# Patient Record
Sex: Male | Born: 1995 | Race: White | Hispanic: No | Marital: Single | Smoking: Former smoker
Health system: Southern US, Community
[De-identification: ages and names within clinical notes are randomized; demographics above are authoritative.]

## PROBLEM LIST (undated history)

## (undated) DIAGNOSIS — F419 Anxiety disorder, unspecified: Secondary | ICD-10-CM

## (undated) DIAGNOSIS — F32A Depression, unspecified: Secondary | ICD-10-CM

## (undated) DIAGNOSIS — R5383 Other fatigue: Secondary | ICD-10-CM

## (undated) DIAGNOSIS — M79669 Pain in unspecified lower leg: Secondary | ICD-10-CM

## (undated) DIAGNOSIS — K219 Gastro-esophageal reflux disease without esophagitis: Secondary | ICD-10-CM

## (undated) DIAGNOSIS — I10 Essential (primary) hypertension: Secondary | ICD-10-CM

## (undated) DIAGNOSIS — R7303 Prediabetes: Secondary | ICD-10-CM

## (undated) DIAGNOSIS — F840 Autistic disorder: Secondary | ICD-10-CM

## (undated) DIAGNOSIS — E7800 Pure hypercholesterolemia, unspecified: Secondary | ICD-10-CM

## (undated) DIAGNOSIS — R531 Weakness: Secondary | ICD-10-CM

## (undated) DIAGNOSIS — R0602 Shortness of breath: Secondary | ICD-10-CM

## (undated) DIAGNOSIS — F909 Attention-deficit hyperactivity disorder, unspecified type: Secondary | ICD-10-CM

## (undated) DIAGNOSIS — T7840XA Allergy, unspecified, initial encounter: Secondary | ICD-10-CM

## (undated) DIAGNOSIS — Q999 Chromosomal abnormality, unspecified: Secondary | ICD-10-CM

## (undated) DIAGNOSIS — G479 Sleep disorder, unspecified: Secondary | ICD-10-CM

## (undated) HISTORY — PX: OTHER SURGICAL HISTORY: SHX169

## (undated) HISTORY — DX: Shortness of breath: R06.02

## (undated) HISTORY — DX: Autistic disorder: F84.0

## (undated) HISTORY — DX: Pain in unspecified lower leg: M79.669

## (undated) HISTORY — DX: Pure hypercholesterolemia, unspecified: E78.00

## (undated) HISTORY — DX: Gastro-esophageal reflux disease without esophagitis: K21.9

## (undated) HISTORY — PX: ORCHIOPEXY: SHX479

## (undated) HISTORY — DX: Prediabetes: R73.03

## (undated) HISTORY — DX: Sleep disorder, unspecified: G47.9

## (undated) HISTORY — PX: CIRCUMCISION REVISION: SHX1347

## (undated) HISTORY — DX: Weakness: R53.1

## (undated) HISTORY — DX: Allergy, unspecified, initial encounter: T78.40XA

## (undated) HISTORY — DX: Anxiety disorder, unspecified: F41.9

## (undated) HISTORY — PX: CIRCUMCISION: SUR203

## (undated) HISTORY — DX: Depression, unspecified: F32.A

## (undated) HISTORY — DX: Other fatigue: R53.83

## (undated) HISTORY — PX: FRENULECTOMY, LINGUAL: SHX1681

## (undated) HISTORY — DX: Chromosomal abnormality, unspecified: Q99.9

---
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
## 2014-02-08 ENCOUNTER — Encounter: Payer: Self-pay | Admitting: Pediatrics

## 2014-02-08 ENCOUNTER — Ambulatory Visit (INDEPENDENT_AMBULATORY_CARE_PROVIDER_SITE_OTHER): Payer: Medicaid Other | Admitting: Pediatrics

## 2014-02-08 VITALS — BP 126/70 | Wt 327.1 lb

## 2014-02-08 DIAGNOSIS — R32 Unspecified urinary incontinence: Secondary | ICD-10-CM

## 2014-02-08 DIAGNOSIS — Z23 Encounter for immunization: Secondary | ICD-10-CM

## 2014-02-08 DIAGNOSIS — J301 Allergic rhinitis due to pollen: Secondary | ICD-10-CM

## 2014-02-08 MED ORDER — OXYBUTYNIN CHLORIDE ER 10 MG PO TB24: ORAL_TABLET | ORAL | Status: DC

## 2014-02-08 MED ORDER — CETIRIZINE HCL 10 MG PO TABS: ORAL_TABLET | ORAL | Status: DC

## 2014-02-08 NOTE — Progress Notes (Signed)
   Subjective:    Patient ID: Allen Harding, male    DOB: 21-Jan-1996, 18 y.o.   MRN: [id number]  HPI 18 year old male in to get refills on Oxybutynin and cetirizine. Both medications work but he's out. He's having some allergy symptoms now a little bit of snoring.   Review of Systems see history of present illness     Objective:   Physical Exam  Alert in no distress Nose: Congested turbinates Throat: Clear Lungs: Clear to auscultation Heart regular rate and rhythm without murmur      Assessment & Plan:  Allergic rhinitis Enuresis Plan we will oxybutynin and cetirizine HPV vaccine #1

## 2014-02-08 NOTE — Patient Instructions (Signed)

## 2014-03-23 ENCOUNTER — Other Ambulatory Visit: Payer: Self-pay | Admitting: *Deleted

## 2014-03-23 DIAGNOSIS — E669 Obesity, unspecified: Secondary | ICD-10-CM

## 2014-04-05 ENCOUNTER — Other Ambulatory Visit: Payer: Self-pay | Admitting: *Deleted

## 2014-04-05 DIAGNOSIS — R1013 Epigastric pain: Secondary | ICD-10-CM

## 2014-04-05 MED ORDER — OMEPRAZOLE 40 MG PO CPDR: 40.0000 mg | DELAYED_RELEASE_CAPSULE | Freq: Two times a day (BID) | ORAL | Status: DC

## 2014-04-21 ENCOUNTER — Ambulatory Visit: Payer: Medicaid Other

## 2014-04-22 ENCOUNTER — Encounter: Payer: Self-pay | Admitting: Pediatrics

## 2014-05-03 LAB — COMPREHENSIVE METABOLIC PANEL
ALBUMIN: 3.8 g/dL (ref 3.5–5.2)
ALT: 21 U/L (ref 0–53)
AST: 17 U/L (ref 0–37)
Alkaline Phosphatase: 105 U/L (ref 39–117)
BILIRUBIN TOTAL: 0.2 mg/dL (ref 0.2–1.1)
BUN: 16 mg/dL (ref 6–23)
CO2: 27 meq/L (ref 19–32)
Calcium: 9 mg/dL (ref 8.4–10.5)
Chloride: 104 mEq/L (ref 96–112)
Creat: 0.74 mg/dL (ref 0.50–1.35)
Glucose, Bld: 124 mg/dL — ABNORMAL HIGH (ref 70–99)
POTASSIUM: 4.6 meq/L (ref 3.5–5.3)
SODIUM: 140 meq/L (ref 135–145)
Total Protein: 6.7 g/dL (ref 6.0–8.3)

## 2014-05-03 LAB — TESTOSTERONE, FREE, TOTAL, SHBG
Sex Hormone Binding: 18 nmol/L (ref 13–71)
Testosterone, Free: 36.8 pg/mL — ABNORMAL LOW (ref 47.0–244.0)
Testosterone-% Free: 2.5 % (ref 1.6–2.9)
Testosterone: 145 ng/dL — ABNORMAL LOW (ref 300–890)

## 2014-05-03 LAB — HEMOGLOBIN A1C
Hgb A1c MFr Bld: 5.9 % — ABNORMAL HIGH (ref <5.7)
Mean Plasma Glucose: 123 mg/dL — ABNORMAL HIGH (ref <117)

## 2014-05-03 LAB — LIPID PANEL
Cholesterol: 202 mg/dL — ABNORMAL HIGH (ref 0–200)
HDL: 54 mg/dL (ref >39)
LDL Cholesterol: 110 mg/dL — ABNORMAL HIGH (ref 0–99)
Total CHOL/HDL Ratio: 3.7 Ratio
Triglycerides: 192 mg/dL — ABNORMAL HIGH (ref <150)
VLDL: 38 mg/dL (ref 0–40)

## 2014-05-03 LAB — LUTEINIZING HORMONE: LH: 12.8 m[IU]/mL

## 2014-05-03 LAB — T4, FREE: FREE T4: 0.91 ng/dL (ref 0.80–1.80)

## 2014-05-03 LAB — TSH: TSH: 3.35 u[IU]/mL (ref 0.350–4.500)

## 2014-05-03 LAB — FOLLICLE STIMULATING HORMONE: FSH: 6.7 m[IU]/mL (ref 1.4–18.1)

## 2014-05-03 LAB — ESTRADIOL: ESTRADIOL: 26.5 pg/mL

## 2014-05-09 ENCOUNTER — Encounter: Payer: Self-pay | Admitting: Pediatric Endocrinology

## 2014-05-09 ENCOUNTER — Ambulatory Visit (INDEPENDENT_AMBULATORY_CARE_PROVIDER_SITE_OTHER): Payer: Medicaid Other | Admitting: Pediatric Endocrinology

## 2014-05-09 VITALS — BP 171/98 | HR 138 | Wt 313.0 lb

## 2014-05-09 DIAGNOSIS — R7309 Other abnormal glucose: Secondary | ICD-10-CM

## 2014-05-09 DIAGNOSIS — R7303 Prediabetes: Secondary | ICD-10-CM

## 2014-05-09 DIAGNOSIS — N62 Hypertrophy of breast: Secondary | ICD-10-CM

## 2014-05-09 DIAGNOSIS — E291 Testicular hypofunction: Secondary | ICD-10-CM

## 2014-05-09 DIAGNOSIS — Q999 Chromosomal abnormality, unspecified: Secondary | ICD-10-CM

## 2014-05-09 DIAGNOSIS — Z68.41 Body mass index (BMI) pediatric, greater than or equal to 95th percentile for age: Secondary | ICD-10-CM

## 2014-05-09 MED ORDER — TESTOSTERONE CYPIONATE 200 MG/ML IM SOLN: 100.0000 mg | INTRAMUSCULAR | Status: DC

## 2014-05-09 NOTE — Patient Instructions (Addendum)
Continue Metformin  Start Testosterone 100 mg q month x 3 months  When you have your vial from the pharmacy call to schedule nurse visit for injection.   Follow up 1 month with Rayfield Citizenaroline and 4 months with me.  Labs prior to visit with me in 4 months - please complete post card at discharge.   Use notebook to keep track of food and drink choices and exercise achievements  Go to ortho to evaluate your arm!

## 2014-05-09 NOTE — Progress Notes (Signed)
Subjective:  Subjective Patient Name: Allen Harding Date of Birth: Oct 07, 1995  MRN: [id number]  Allen Harding Bar  presents to the office today for follow-up and management of his prediabetes, morbid obesity, hypertension, depression, dyspepsia, genetic chromosomal abnormality syndrome, precocity, gynecomastia, microphallus, hypogonadism, ADHD, tachycardia, goiter, behavior disorder, and hypercholesterolemia   HISTORY OF PRESENT ILLNESS:   Allen Harding is a 18 y.o. Caucasian male   Allen Harding was accompanied by his mother  1.  "Allen Harding" was first referred to our clinic on 09/10/06 by his his cardiologist, Dr. [facility], for evaluation and management of prediabetes and obesity. He was [age]-1/18 years old. His medical history is significant for abnormal chromosomes and developmental delay with walking and speech development at about age 41 year. At age 18 he still had some gross motor motor and fine motor problems. The family had been concerned about his weight since the first grade.     2. The patient's last [facility] visit was on 12/27/13. In the interim, he has been generally healthy. He had some girlfriend issues this fall and was eating everything he could find. He has continued to be active with [employer]. He is not currently working with the [employer] but is still trying to get enough hours to graduate. He was seen by his PCP in September and his weight was about 15 pounds heavier there. He is continuing on his blood pressure medication and continues to follow with Dr. Ace GinsBuck in cardiology (last visit in July). He is also taking his metformin daily. He is having to wear a belt because his pants are too loose. 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  He hurt his arm yesterday falling into a tree. He has not yet had it evaluated but has pain when he tries to extend his arm.   3. Pertinent Review of Systems:  Constitutional: The patient feels "like crap". The patient  seems to be in pain from his arm.  Eyes: Vision seems to be good. There are no recognized eye problems. Wears glasses- eye exam in April 2015 Neck: The patient has no complaints of anterior neck swelling, soreness, tenderness, pressure, discomfort, or difficulty swallowing.   Heart: Heart rate increases with exercise or other physical activity. The patient has no complaints of palpitations, irregular heart beats, chest pain, or chest pressure.   Gastrointestinal: Bowel movents seem normal. The patient has no complaints of excessive hunger, acid reflux, upset stomach, stomach aches or pains, diarrhea, or constipation.  Legs: Muscle mass and strength seem normal. There are no complaints of numbness, tingling, burning, or pain. No edema is noted.  Feet: There are no obvious foot problems. There are no complaints of numbness, tingling, burning, or pain. No edema is noted. Neurologic: There are no recognized problems with muscle movement and strength, sensation, or coordination. GYN/GU: no nocturia if he takes his oxybutin- but otherwise still with primary enuresis. Had renal ultrasound and structurally normal  PAST MEDICAL, FAMILY, AND SOCIAL HISTORY  Past Medical History  Diagnosis Date  . Anxiety   . Diabetes mellitus   . 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 Obesity Mother   . Diabetes Mother   . Hypertension Mother   . Hyperlipidemia Mother   . Obesity Sister   . Obesity Maternal Grandmother   . Diabetes Maternal Grandmother   . Obesity Maternal Grandfather  Current outpatient prescriptions: cetirizine (ZYRTEC) 10 MG tablet, TAKE ONE TABLET BY MOUTH DAILY., Disp: 30 tablet, Rfl: 5;  enalapril (VASOTEC) 5 MG tablet, Take 7.5 mg by mouth 2 (two) times daily., Disp: , Rfl: ;  guanFACINE (INTUNIV) 2 MG TB24, Take 3 mg by mouth daily. , Disp: , Rfl: ;  hydrOXYzine (ATARAX/VISTARIL) 25 MG tablet, Take 1 tablet (25 mg  total) by mouth every 6 (six) hours as needed for itching., Disp: 30 tablet, Rfl: 0 lisdexamfetamine (VYVANSE) 20 MG capsule, Take 20 mg by mouth every morning.  , Disp: , Rfl: ;  metFORMIN (GLUCOPHAGE) 500 MG tablet, Take 2 tablets (1,000 mg total) by mouth 2 (two) times daily with a meal., Disp: 60 tablet, Rfl: 6;  nystatin cream (MYCOSTATIN), Apply topically 2 (two) times daily. To groin/ belt area, Disp: 30 g, Rfl: 1 omeprazole (PRILOSEC) 40 MG capsule, Take 1 capsule (40 mg total) by mouth 2 (two) times daily., Disp: 60 capsule, Rfl: 6;  ondansetron (ZOFRAN-ODT) 4 MG disintegrating tablet, Take 1 tablet (4 mg total) by mouth every 8 (eight) hours as needed for nausea or vomiting., Disp: 20 tablet, Rfl: 0;  oxybutynin (DITROPAN-XL) 10 MG 24 hr tablet, TAKE 1 TABLET BY MOUTH DAILY., Disp: 30 tablet, Rfl: 3 sertraline (ZOLOFT) 25 MG tablet, Take 100 mg by mouth daily. , Disp: , Rfl: ;  testosterone cypionate (DEPOTESTOTERONE CYPIONATE) 200 MG/ML injection, Inject 0.5 mLs (100 mg total) into the muscle every 28 (twenty-eight) days. X 3 doses, Disp: 10 mL, Rfl: 0;  traZODone (DESYREL) 100 MG tablet, Take 100 mg by mouth at bedtime., Disp: , Rfl: ;  [DISCONTINUED] oxybutynin (DITROPAN) 5 MG tablet, Take 10 mg by mouth Nightly. , Disp: , Rfl:   Allergies as of 05/09/2014  . (No Known Allergies)     reports that he has never smoked. He has never used smokeless tobacco. He reports that he does not drink alcohol or use illicit drugs. Pediatric History  Patient Guardian Status  . Mother:  Allen Harding,Allen Harding   Other Topics Concern  . Not on file   Social History Narrative   Lives with mom, sister, 2 nieces, grandparents and sister's fiance.    12th grade at [employer]   Primary Care Provider: Martyn EhrichKHALIFA,DALIA, MD  ROS: There are no other significant problems involving Mecca's other body systems.    Objective:  Objective Vital Signs:  BP 171/98 mmHg  Pulse 138  Wt 313 lb (141.976 kg)   Ht  Readings from Last 3 Encounters:  09/07/13 5\' 9"  (1.753 m) (44 %*, Z = -0.16)  08/19/13 5' 8.5" (1.74 m) (37 %*, Z = -0.33)  02/22/13 5' 8.9" (1.75 m) (44 %*, Z = -0.15)   * Growth percentiles are based on CDC 2-20 Years data.   Wt Readings from Last 3 Encounters:  05/09/14 313 lb (141.976 kg) (100 %*, Z = 3.18)  02/08/14 327 lb 2 oz (148.383 kg) (100 %*, Z = 3.30)  12/27/13 312 lb (141.522 kg) (100 %*, Z = 3.16)   * Growth percentiles are based on CDC 2-20 Years data.   HC Readings from Last 3 Encounters:  [facility]   There is no height on file to calculate BSA. No height on file for this encounter. 100%ile (Z=3.18) based on CDC 2-20 Years weight-for-age data using vitals from 05/09/2014.    PHYSICAL EXAM:  Constitutional: The patient appears healthy and well nourished. The patient's height and weight are advanced for age.  Head: The head is normocephalic. Face: The face appears normal. There are no obvious dysmorphic features. Eyes: The eyes appear to be normally formed and spaced. Gaze is conjugate. There is no obvious arcus or proptosis. Moisture appears normal. Ears: The ears are normally placed and appear externally normal. Mouth: The oropharynx and tongue appear normal. Dentition appears to be normal for age. Oral moisture is normal. Neck: The neck appears to be visibly normal. The thyroid gland is 18+ grams in size. The consistency of the thyroid gland is normal. The thyroid gland is not tender to palpation. Lungs: The lungs are clear to auscultation. Air movement is good. Heart: Heart rate and rhythm are regular. Heart sounds S1 and S2 are normal. I did not appreciate any pathologic cardiac murmurs. Abdomen: The abdomen appears to be morbidly in size for the patient's age. Bowel sounds are normal. Unable to assess hepatomegaly, splenomegaly, or other mass effect.  Arms: Muscle size and bulk are normal for age. Point tenderness at base of radius on left arm.  Unable to extend left arm.  Hands: There is no obvious tremor. Phalangeal and metacarpophalangeal joints are normal. Palmar muscles are normal for age. Palmar skin is normal. Palmar moisture is also normal. Legs: Muscles appear normal for age. No edema is present. Feet: Feet are normally formed. Dorsalis pedal pulses are normal. Neurologic: Strength is normal for age in both the upper and lower extremities. Muscle tone is normal. Sensation to touch is normal in both the legs and feet.   GYN/GU: +2 gynecomastia  LAB DATA:   Results for orders placed or performed in visit on 03/23/14 (from the past 672 hour(s))  Hemoglobin A1c   Collection Time: 05/02/14  4:30 PM  Result Value Ref Range   Hgb A1c MFr Bld 5.9 (H) <5.7 %   Mean Plasma Glucose 123 (H) <117 mg/dL  Comprehensive metabolic panel   Collection Time: 05/02/14  4:30 PM  Result Value Ref Range   Sodium 140 135 - 145 mEq/L   Potassium 4.6 3.5 - 5.3 mEq/L   Chloride 104 96 - 112 mEq/L   CO2 27 19 - 32 mEq/L   Glucose, Bld 124 (H) 70 - 99 mg/dL   BUN 16 6 - 23 mg/dL   Creat 1.61 0.96 - 0.45 mg/dL   Total Bilirubin 0.2 0.2 - 1.1 mg/dL   Alkaline Phosphatase 105 39 - 117 U/L   AST 17 0 - 37 U/L   ALT 21 0 - 53 U/L   Total Protein 6.7 6.0 - 8.3 g/dL   Albumin 3.8 3.5 - 5.2 g/dL   Calcium 9.0 8.4 - 40.9 mg/dL  Lipid panel   Collection Time: 05/02/14  4:30 PM  Result Value Ref Range   Cholesterol 202 (H) 0 - 200 mg/dL   Triglycerides 811 (H) <150 mg/dL   HDL 54 >91 mg/dL   Total CHOL/HDL Ratio 3.7 Ratio   VLDL 38 0 - 40 mg/dL   LDL Cholesterol 478 (H) 0 - 99 mg/dL  TSH   Collection Time: 05/02/14  4:30 PM  Result Value Ref Range   TSH 3.350 0.350 - 4.500 uIU/mL  T4, free   Collection Time: 05/02/14  4:30 PM  Result Value Ref Range   Free T4 0.91 0.80 - 1.80 ng/dL  Estradiol   Collection Time: 05/02/14  4:30 PM  Result Value Ref Range   Estradiol 26.5 pg/mL  Follicle stimulating hormone   Collection Time: 05/02/14   4:30 PM  Result Value  Ref Range   FSH 6.7 1.4 - 18.1 mIU/mL  Luteinizing hormone   Collection Time: 05/02/14  4:30 PM  Result Value Ref Range   LH 12.8 mIU/mL  Testosterone, free, total   Collection Time: 05/02/14  4:30 PM  Result Value Ref Range   Testosterone 145 (L) 300 - 890 ng/dL   Sex Hormone Binding 18 13 - 71 nmol/L   Testosterone, Free 36.8 (L) 47.0 - 244.0 pg/mL   Testosterone-% Free 2.5 1.6 - 2.9 %      Assessment and Plan:  Assessment ASSESSMENT:  1. Morbid obesity- weight today is stabe last visit- but was 15 pounds heavier at PCP in September 2. Hypertension- persistent- worse today- likely due to acute arm injury 3. Prediabetes- a1c has increased again 4. Gynecomastia- will likely need surgery to correct but needs to manage weight first (currently not a good surgical candidate).  5. Hypogonadism- has had chronically low serum testosterone levels. Relatively normal to low gonadotropins.  6. Elevated triglycerides- much higher than last check- but labs not done fasting.  PLAN:  1. Diagnostic: labs as above. Fasting labs prior to visit in 4 months for lipids, cmp, tfts, puberty labs 2. Therapeutic: No changes 3. Patient education: Reviewed growth data. Discussed challenges and goals. Discussed trial of testosterone and family would like to try 3 month "jump start" for puberty. Discussed having him work with Rayfield Citizen for more frequent accountability and encouragement. Lu Duffel a little nervous but agrees to at least 1 visit with Rayfield Citizen to "meet her".  Mom and Allen Reno both participated in visit today.  4. Follow-up: Return in about 1 month (around 06/09/2014). 1 month with Rayfield Citizen and 4 months with me.      Cammie Sickle, MD

## 2014-05-17 ENCOUNTER — Encounter: Payer: Self-pay | Admitting: Pediatric Endocrinology

## 2014-05-17 ENCOUNTER — Ambulatory Visit (INDEPENDENT_AMBULATORY_CARE_PROVIDER_SITE_OTHER): Payer: Medicaid Other | Admitting: Pediatric Endocrinology

## 2014-05-17 VITALS — BP 160/92 | HR 96 | Temp 96.8°F | Wt 310.0 lb

## 2014-05-17 DIAGNOSIS — E291 Testicular hypofunction: Secondary | ICD-10-CM

## 2014-05-19 NOTE — Progress Notes (Signed)
Patient was given 0.5 ml of Testosterone Cypinate for a dose of 100 mg. Injection given in left arm without problems.  Perrigo Lot [id number] Exp. 6/17

## 2014-06-09 ENCOUNTER — Encounter: Payer: Self-pay | Admitting: Pediatrics

## 2014-06-09 ENCOUNTER — Ambulatory Visit (INDEPENDENT_AMBULATORY_CARE_PROVIDER_SITE_OTHER): Payer: Medicaid Other | Admitting: Pediatrics

## 2014-06-09 VITALS — BP 139/87 | HR 95 | Wt 319.0 lb

## 2014-06-09 DIAGNOSIS — R7309 Other abnormal glucose: Secondary | ICD-10-CM

## 2014-06-09 DIAGNOSIS — N62 Hypertrophy of breast: Secondary | ICD-10-CM

## 2014-06-09 DIAGNOSIS — R7303 Prediabetes: Secondary | ICD-10-CM

## 2014-06-09 DIAGNOSIS — Q999 Chromosomal abnormality, unspecified: Secondary | ICD-10-CM

## 2014-06-09 DIAGNOSIS — I10 Essential (primary) hypertension: Secondary | ICD-10-CM

## 2014-06-09 NOTE — Patient Instructions (Signed)
Goals:  1. Walking around the curve every day mom feels good  2. Mom will look into getting back into the [employer] this month 3. Only 1 sweet drink a day!!

## 2014-06-09 NOTE — Progress Notes (Signed)
Subjective:  Subjective Patient Name: Allen Harding Date of Birth: [birth year]  MRN: [id number]  Allen Harding  presents to the office today for follow-up and management of his prediabetes, morbid obesity, hypertension, depression, dyspepsia, genetic chromosomal abnormality syndrome, precocity, gynecomastia, microphallus, hypogonadism, ADHD, tachycardia, goiter, behavior disorder, and hypercholesterolemia   HISTORY OF PRESENT ILLNESS:   Allen Harding is a 19 y.o. Caucasian male   Allen Harding was accompanied by his mother  1.  "Allen Harding" was first referred to our clinic on 09/10/06 by his his cardiologist, Dr. [name] of [facility], for evaluation and management of prediabetes and obesity. He was 66-1/20 years old. His medical history is significant for abnormal chromosomes and developmental delay with walking and speech development at about age 51 year. At age 59 he still had some gross motor motor and fine motor problems. The family had been concerned about his weight since the first grade.     2. The patient's last [facility] visit was on 05/17/14.   Mom has been keeping a food log since the beginning of January. He has not been eating as much and doesn't eat lunch at school but has been drinking a significant amount of soda (3-4 cans a day). He only likes green beans and peas. He ate significant amounts of food over the holidays because he was home and bored.   He forgets to take his medications on some days. He is doing well on not taking other people's food in the house. He feels like he needs ranch on everything. Having a hard time being away from soda. They have had to lock the refrigerator in the past to keep him from eating. If they take away things like his cellphone to enforce consequences on him not doing what he should around the house, he becomes physically violent. His current goals are to meet a girlfriend online. He has little supervision around his online activities despite his autism and will ask the women  to be his girlfriend when he has just contacted them.      Mom is taking Invokana for her diabetes. It has worked really well for her. His next cardiology visit is 2/18.     3. Pertinent Review of Systems:  Constitutional: The patient feels "tired". The patient seems to be in pain from his arm.  Eyes: Vision seems to be good. There are no recognized eye problems. Wears glasses- eye exam in April 2015 Neck: The patient has no complaints of anterior neck swelling, soreness, tenderness, pressure, discomfort, or difficulty swallowing.   Heart: Heart rate increases with exercise or other physical activity. The patient has no complaints of palpitations, irregular heart beats, chest pain, or chest pressure.   Gastrointestinal: Bowel movents seem normal. The patient has no complaints of excessive hunger, acid reflux, upset stomach, stomach aches or pains, diarrhea, or constipation.  Legs: Muscle mass and strength seem normal. There are no complaints of numbness, tingling, burning, or pain. No edema is noted.  Feet: There are no obvious foot problems. There are no complaints of numbness, tingling, burning, or pain. No edema is noted. Neurologic: There are no recognized problems with muscle movement and strength, sensation, or coordination. GYN/GU: no nocturia if he takes his oxybutin- but otherwise still with primary enuresis. Had renal ultrasound and structurally normal  PAST MEDICAL, FAMILY, AND SOCIAL HISTORY  Past Medical History  Diagnosis Date  . Anxiety   . Diabetes mellitus   . 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 Obesity Mother   . Diabetes Mother   . Hypertension Mother   . Hyperlipidemia Mother   . Obesity Sister   . Obesity Maternal Grandmother   . Diabetes Maternal Grandmother   . Obesity Maternal Grandfather     Current outpatient prescriptions: cetirizine (ZYRTEC) 10 MG tablet, TAKE ONE TABLET BY MOUTH  DAILY., Disp: 30 tablet, Rfl: 5;  enalapril (VASOTEC) 5 MG tablet, Take 7.5 mg by mouth 2 (two) times daily., Disp: , Rfl: ;  guanFACINE (INTUNIV) 2 MG TB24, Take 3 mg by mouth daily. , Disp: , Rfl: ;  hydrOXYzine (ATARAX/VISTARIL) 25 MG tablet, Take 1 tablet (25 mg total) by mouth every 6 (six) hours as needed for itching., Disp: 30 tablet, Rfl: 0 lisdexamfetamine (VYVANSE) 20 MG capsule, Take 20 mg by mouth every morning.  , Disp: , Rfl: ;  metFORMIN (GLUCOPHAGE) 500 MG tablet, Take 2 tablets (1,000 mg total) by mouth 2 (two) times daily with a meal., Disp: 60 tablet, Rfl: 6;  omeprazole (PRILOSEC) 40 MG capsule, Take 1 capsule (40 mg total) by mouth 2 (two) times daily., Disp: 60 capsule, Rfl: 6 oxybutynin (DITROPAN-XL) 10 MG 24 hr tablet, TAKE 1 TABLET BY MOUTH DAILY., Disp: 30 tablet, Rfl: 3;  sertraline (ZOLOFT) 25 MG tablet, Take 100 mg by mouth daily. , Disp: , Rfl: ;  testosterone cypionate (DEPOTESTOTERONE CYPIONATE) 200 MG/ML injection, Inject 0.5 mLs (100 mg total) into the muscle every 28 (twenty-eight) days. X 3 doses, Disp: 10 mL, Rfl: 0;  traZODone (DESYREL) 100 MG tablet, Take 100 mg by mouth at bedtime., Disp: , Rfl:  nystatin cream (MYCOSTATIN), Apply topically 2 (two) times daily. To groin/ belt area (Patient not taking: Reported on 06/09/2014), Disp: 30 g, Rfl: 1;  ondansetron (ZOFRAN-ODT) 4 MG disintegrating tablet, Take 1 tablet (4 mg total) by mouth every 8 (eight) hours as needed for nausea or vomiting. (Patient not taking: Reported on 06/09/2014), Disp: 20 tablet, Rfl: 0 [DISCONTINUED] oxybutynin (DITROPAN) 5 MG tablet, Take 10 mg by mouth Nightly. , Disp: , Rfl:   Allergies as of 06/09/2014  . (No Known Allergies)     reports that he has never smoked. He has never used smokeless tobacco. He reports that he does not drink alcohol or use illicit drugs. Pediatric History  Patient Guardian Status  . Mother:  Alpha, Mysliwiec   Other Topics Concern  . Not on file   Social History  Narrative   Lives with mom, sister, 2 nieces, grandparents and sister's fiance.    12th grade at [employer]   Primary Care Provider: [name], MD  ROS: There are no other significant problems involving Allen Harding's other body systems.    Objective:  Objective Vital Signs:  BP 139/87 mmHg  Pulse 95  Wt 319 lb (144.697 kg)   Ht Readings from Last 3 Encounters:  09/07/13  (1.753 m) (44 %*, Z = -0.16)  08/19/13 5' 8.5" (1.74 m) (37 %*, Z = -0.33)  02/22/13 5' 8.9" (1.75 m) (44 %*, Z = -0.15)   * Growth percentiles are based on CDC 2-20 Years data.   Wt Readings from Last 3 Encounters:  06/09/14 319 lb (144.697 kg) (100 %*, Z = 3.24)  05/17/14 310 lb (140.615 kg) (100 %*, Z = 3.15)  05/09/14 313 lb (141.976 kg) (100 %*, Z = 3.18)   * Growth percentiles are based on CDC 2-20 Years data.   HC Readings from  Last 3 Encounters:  No data found for HC   There is no height on file to calculate BSA. No height on file for this encounter. 100%ile (Z=3.24) based on CDC 2-20 Years weight-for-age data using vitals from 06/09/2014.    PHYSICAL EXAM:  Constitutional: The patient appears healthy and well nourished. The patient's height and weight are advanced for age.  Head: The head is normocephalic. Face: The face appears normal. There are no obvious dysmorphic features. Eyes: The eyes appear to be normally formed and spaced. Gaze is conjugate. There is no obvious arcus or proptosis. Moisture appears normal. Ears: The ears are normally placed and appear externally normal. Mouth: The oropharynx and tongue appear normal. Dentition appears to be normal for age. Oral moisture is normal. Neck: The neck appears to be visibly normal. The thyroid gland is 18+ grams in size. The consistency of the thyroid gland is normal. The thyroid gland is not tender to palpation. Lungs: The lungs are clear to auscultation. Air movement is good. Heart: Heart rate and rhythm are regular. Heart  sounds S1 and S2 are normal. I did not appreciate any pathologic cardiac murmurs. Abdomen: The abdomen appears to be morbidly in size for the patient's age. Bowel sounds are normal. Unable to assess hepatomegaly, splenomegaly, or other mass effect.  Arms: Muscle size and bulk are normal for age. Hands: There is no obvious tremor. Phalangeal and metacarpophalangeal joints are normal. Palmar muscles are normal for age. Palmar skin is normal. Palmar moisture is also normal. Legs: Muscles appear normal for age. No edema is present. Feet: Feet are normally formed. Dorsalis pedal pulses are normal. Neurologic: Strength is normal for age in both the upper and lower extremities. Muscle tone is normal. Sensation to touch is normal in both the legs and feet.   GYN/GU: +2 gynecomastia  LAB DATA:   No results found for this or any previous visit (from the past 672 hour(s)).    Assessment and Plan:  Assessment ASSESSMENT:  1. Morbid obesity-He has gained 9 pounds in the month since we saw him last  2. Hypertension- persistent- better today than last visit. They will f/u with the cardiologist next month.  3. Prediabetes- not always taking medications. Not yet due for repeat A1C 4. Gynecomastia- will likely need surgery to correct but needs to manage weight first (currently not a good surgical candidate).  5. Hypogonadism- has had chronically low serum testosterone levels. Started testosterone after last visit for a 3 month trial.  6. Elevated triglycerides- much higher than last check- but labs not done fasting.  PLAN:  1. Diagnostic: Fasting labs prior to visit in 4 months for lipids, cmp, tfts, puberty labs 2. Therapeutic: Continue testosterone injections 100 mg every other week to attempt to induce puberty and improve muscle mass and energy.  3. Patient education: Reviewed growth data. Discussed challenges and goals. Allen RenoStewart agreed to 1 sugary beverage a day and walking around the curve at the house  when mom feels well. Mom will also work on getting back into the [employer].  Mom and Allen RenoStewart both participated in visit today.  4. Follow-up: No Follow-up on file. 1 month with Rayfield Citizenaroline and 4 months with me.      Hacker,Caroline T, FNP-C

## 2014-06-20 ENCOUNTER — Ambulatory Visit: Payer: Medicaid Other

## 2014-06-23 ENCOUNTER — Encounter: Payer: Self-pay | Admitting: *Deleted

## 2014-06-23 NOTE — Progress Notes (Signed)
On 06/20/2014 patient was given an injection in his left arm of 100mg  of Testosterone Cypionate.  Lot [id number], exp 6/17.  His temp was 97 degrees F oral,  Pulse 112  Unable to obtain BP.  Injection given without difficulty per Dr. Vanessa DurhamBadik. KW

## 2014-07-11 ENCOUNTER — Telehealth: Payer: Self-pay

## 2014-07-11 ENCOUNTER — Other Ambulatory Visit: Payer: Self-pay | Admitting: Pediatrics

## 2014-07-11 DIAGNOSIS — R32 Unspecified urinary incontinence: Secondary | ICD-10-CM

## 2014-07-11 NOTE — Telephone Encounter (Signed)
Oxybutynin ER 10mg  Needs reill.  Patient only has 1 refill left on medication.

## 2014-07-12 MED ORDER — OXYBUTYNIN CHLORIDE ER 10 MG PO TB24: ORAL_TABLET | ORAL | Status: DC

## 2014-07-12 NOTE — Telephone Encounter (Addendum)
Request for refill on Ditropan for child with chromosomal abnormality with associated host of medical issues followed by multiple specialists - endocrine, psychiatry.  Has been taking ditropan chronically for lifelong hx of enuresis. Will refill current prescription as requested by parent.

## 2014-07-12 NOTE — Addendum Note (Signed)
Addended by: Faylene KurtzLEINER, Yalena Colon on: 07/12/2014 05:22 PM   Modules accepted: Orders

## 2014-07-13 ENCOUNTER — Ambulatory Visit: Payer: Medicaid Other | Admitting: Pediatrics

## 2014-07-28 ENCOUNTER — Ambulatory Visit: Payer: Medicaid Other

## 2014-07-28 ENCOUNTER — Encounter: Payer: Self-pay | Admitting: Pediatrics

## 2014-07-28 ENCOUNTER — Ambulatory Visit (INDEPENDENT_AMBULATORY_CARE_PROVIDER_SITE_OTHER): Payer: Medicaid Other | Admitting: Pediatrics

## 2014-07-28 VITALS — BP 180/100 | HR 101 | Wt 322.0 lb

## 2014-07-28 DIAGNOSIS — R7303 Prediabetes: Secondary | ICD-10-CM

## 2014-07-28 DIAGNOSIS — N62 Hypertrophy of breast: Secondary | ICD-10-CM

## 2014-07-28 DIAGNOSIS — E291 Testicular hypofunction: Secondary | ICD-10-CM

## 2014-07-28 DIAGNOSIS — R7309 Other abnormal glucose: Secondary | ICD-10-CM

## 2014-07-28 DIAGNOSIS — I10 Essential (primary) hypertension: Secondary | ICD-10-CM

## 2014-07-28 LAB — GLUCOSE, POCT (MANUAL RESULT ENTRY): POC Glucose: 79 mg/dl (ref 70–99)

## 2014-07-28 LAB — POCT GLYCOSYLATED HEMOGLOBIN (HGB A1C): Hemoglobin A1C: 5.5

## 2014-07-28 MED ORDER — CHLORTHALIDONE 25 MG PO TABS: 25.0000 mg | ORAL_TABLET | Freq: Every day | ORAL | Status: DC

## 2014-07-28 NOTE — Progress Notes (Signed)
Subjective:  Subjective Patient Name: Allen Harding Date of Birth: January 11, 1996  MRN: [id number]  Allen Harding  presents to the office today for follow-up and management of his prediabetes, morbid obesity, hypertension, depression, dyspepsia, genetic chromosomal abnormality syndrome, precocity, gynecomastia, microphallus, hypogonadism, ADHD, tachycardia, goiter, behavior disorder, and hypercholesterolemia   HISTORY OF PRESENT ILLNESS:   Allen Harding is a 19 y.o. Caucasian male   Allen Harding was accompanied by his mother  1.  "Allen Harding" was first referred to our clinic on 09/10/06 by his his cardiologist, Dr. [name] of [facility], for evaluation and management of prediabetes and obesity. He was 58-1/20 years old. His medical history is significant for abnormal chromosomes and developmental delay with walking and speech development at about age 89 year. At age 66 he still had some gross motor motor and fine motor problems. The family had been concerned about his weight since the first grade.     2. The patient's last [facility] visit was on 06/09/14.   They haven't been getting mis medicine in as consistently twice a day. Did his last testosterone shot today. He was nervous about it because it hurts.   He has cut back on the soda. He is drinking more milk or flavored water. He will go all day and not eat and then come home after school and eat more. A few times in the last week he has had breakfast to eat (cereal or poptart). He has been volunteering at the [employer].   He feels like the testosterone has "kind of " made a difference. He sort of has more energy. She feels like he isn't eating as much. He is still struggling with depression and is not doing any exercise. He has a new girlfriend who is in [place]. Mom wants him to concentrate on graduation. [facility] is managing his medications for depression. He hasn't seen them in a while.   Scale of 1-10 importance of making a change for his health:  8 Likelihood of doing this: 9    3. Pertinent Review of Systems:  Constitutional: The patient feels "good". He is not having headaches from his hypertension.   Eyes: Vision seems to be good. There are no recognized eye problems. Wears glasses- eye exam in April 2015. Hasn't gotten new glasses yet.  Neck: The patient has no complaints of anterior neck swelling, soreness, tenderness, pressure, discomfort, or difficulty swallowing.   Heart: Heart rate increases with exercise or other physical activity. The patient has no complaints of palpitations, irregular heart beats, chest pain, or chest pressure.   Gastrointestinal: Bowel movents seem normal. The patient has no complaints of excessive hunger, acid reflux, upset stomach, stomach aches or pains, diarrhea, or constipation.  Legs: Muscle mass and strength seem normal. There are no complaints of numbness, tingling, burning, or pain. No edema is noted.  Feet: There are no obvious foot problems. There are no complaints of numbness, tingling, burning, or pain. No edema is noted. Neurologic: There are no recognized problems with muscle movement and strength, sensation, or coordination. GYN/GU: no nocturia if he takes his oxybutin- but otherwise still with primary enuresis. Had renal ultrasound and structurally normal  PAST MEDICAL, FAMILY, AND SOCIAL HISTORY  Past Medical History  Diagnosis Date  . Anxiety   . Diabetes mellitus   . 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 Obesity Mother   . Diabetes Mother   .  Hypertension Mother   . Hyperlipidemia Mother   . Obesity Sister   . Obesity Maternal Grandmother   . Diabetes Maternal Grandmother   . Obesity Maternal Grandfather      Current outpatient prescriptions:  .  cetirizine (ZYRTEC) 10 MG tablet, TAKE ONE TABLET BY MOUTH DAILY., Disp: 30 tablet, Rfl: 5 .  enalapril (VASOTEC) 5 MG tablet, Take 10 mg by mouth 2 (two)  times daily. , Disp: , Rfl:  .  guanFACINE (INTUNIV) 2 MG TB24, Take 3 mg by mouth daily. , Disp: , Rfl:  .  hydrOXYzine (ATARAX/VISTARIL) 25 MG tablet, Take 1 tablet (25 mg total) by mouth every 6 (six) hours as needed for itching., Disp: 30 tablet, Rfl: 0 .  lisdexamfetamine (VYVANSE) 20 MG capsule, Take 20 mg by mouth every morning.  , Disp: , Rfl:  .  metFORMIN (GLUCOPHAGE) 500 MG tablet, Take 2 tablets (1,000 mg total) by mouth 2 (two) times daily with a meal., Disp: 60 tablet, Rfl: 6 .  nystatin cream (MYCOSTATIN), Apply topically 2 (two) times daily. To groin/ belt area, Disp: 30 g, Rfl: 1 .  omeprazole (PRILOSEC) 40 MG capsule, Take 1 capsule (40 mg total) by mouth 2 (two) times daily., Disp: 60 capsule, Rfl: 6 .  ondansetron (ZOFRAN-ODT) 4 MG disintegrating tablet, Take 1 tablet (4 mg total) by mouth every 8 (eight) hours as needed for nausea or vomiting., Disp: 20 tablet, Rfl: 0 .  oxybutynin (DITROPAN-XL) 10 MG 24 hr tablet, TAKE 1 TABLET BY MOUTH DAILY., Disp: 30 tablet, Rfl: 3 .  sertraline (ZOLOFT) 25 MG tablet, Take 100 mg by mouth daily. , Disp: , Rfl:  .  testosterone cypionate (DEPOTESTOTERONE CYPIONATE) 200 MG/ML injection, Inject 0.5 mLs (100 mg total) into the muscle every 28 (twenty-eight) days. X 3 doses, Disp: 10 mL, Rfl: 0 .  traZODone (DESYREL) 100 MG tablet, Take 100 mg by mouth at bedtime., Disp: , Rfl:  .  chlorthalidone (HYGROTON) 25 MG tablet, Take 1 tablet (25 mg total) by mouth daily., Disp: 30 tablet, Rfl: 3 .  [DISCONTINUED] oxybutynin (DITROPAN) 5 MG tablet, Take 10 mg by mouth Nightly. , Disp: , Rfl:   Allergies as of 07/28/2014  . (No Known Allergies)     reports that he has never smoked. He has never used smokeless tobacco. He reports that he does not drink alcohol or use illicit drugs. Pediatric History  Patient Guardian Status  . Mother:  [name],[name]   Other Topics Concern  . Not on file   Social History Narrative   Lives with mom, sister, 2  nieces, grandparents and sister's fiance.    12th grade at [employer]   Primary Care Provider: Martyn EhrichKHALIFA,DALIA, MD  ROS: There are no other significant problems involving Zechariah's other body systems.    Objective:  Objective Vital Signs:  BP 171/109 mmHg  Pulse 101  Wt 322 lb (146.058 kg)   Ht Readings from Last 3 Encounters:  09/07/13 5\' 9"  (1.753 m) (44 %*, Z = -0.16)  08/19/13 5' 8.5" (1.74 m) (37 %*, Z = -0.33)  02/22/13 5' 8.9" (1.75 m) (44 %*, Z = -0.15)   * Growth percentiles are based on CDC 2-20 Years data.   Wt Readings from Last 3 Encounters:  07/28/14 322 lb (146.058 kg) (100 %*, Z = 3.28)  06/09/14 319 lb (144.697 kg) (100 %*, Z = 3.24)  05/17/14 310 lb (140.615 kg) (100 %*, Z = 3.15)   * Growth percentiles are  based on CDC 2-20 Years data.   HC Readings from Last 3 Encounters:  No data found for [facility]   There is no height on file to calculate BSA. No height on file for this encounter. 100%ile (Z=3.28) based on CDC 2-20 Years weight-for-age data using vitals from 07/28/2014.    PHYSICAL EXAM:  Constitutional: The patient appears healthy and well nourished. The patient's height and weight are advanced for age.  Head: The head is normocephalic. Face: The face appears normal. There are no obvious dysmorphic features. Eyes: The eyes appear to be normally formed and spaced. Gaze is conjugate. There is no obvious arcus or proptosis. Moisture appears normal. Ears: The ears are normally placed and appear externally normal. Mouth: The oropharynx and tongue appear normal. Dentition appears to be normal for age. Oral moisture is normal. Neck: The neck appears to be visibly normal. The thyroid gland is 18+ grams in size. The consistency of the thyroid gland is normal. The thyroid gland is not tender to palpation. Lungs: The lungs are clear to auscultation. Air movement is good. Heart: Heart rate and rhythm are regular. Heart sounds S1 and S2 are normal. I did not  appreciate any pathologic cardiac murmurs. Abdomen: The abdomen appears to be morbidly in size for the patient's age. Bowel sounds are normal. Unable to assess hepatomegaly, splenomegaly, or other mass effect.  Arms: Muscle size and bulk are normal for age. Hands: There is no obvious tremor. Phalangeal and metacarpophalangeal joints are normal. Palmar muscles are normal for age. Palmar skin is normal. Palmar moisture is also normal. Legs: Muscles appear normal for age. No edema is present. Feet: Feet are normally formed. Dorsalis pedal pulses are normal. Neurologic: Strength is normal for age in both the upper and lower extremities. Muscle tone is normal. Sensation to touch is normal in both the legs and feet.   GYN/GU: +2 gynecomastia  LAB DATA:   Results for orders placed or performed in visit on 07/28/14 (from the past 672 hour(s))  POCT Glucose (CBG)   Collection Time: 07/28/14  3:13 PM  Result Value Ref Range   POC Glucose 79 70 - 99 mg/dl  POCT HgB Z6X   Collection Time: 07/28/14  3:32 PM  Result Value Ref Range   Hemoglobin A1C 5.5       Assessment and Plan:  Assessment ASSESSMENT:  1. Morbid obesity-continues to gain weight-- up 3 pounds again.  2. Hypertension-despite cardiology follow up and an increase of his enalapril, his BP is the worse I've seen it, even on a manual check after his anxiety about testosterone injection had subsided. Consulted with his cardiology group in office to make changes to BP 3. Prediabetes- more consistent with metformin. A1C improved today.  4. Gynecomastia- consistent with continued morbid obesity. 5. Hypogonadism- Has now completed 3 month trial of testosterone. Feels like it has made minimal difference  6. Elevated triglycerides- much higher than last check- but labs not done fasting.   PLAN:  1. Diagnostic: Fasting labs prior to next visit for lipids, cmp, tfts, puberty labs 2. Therapeutic: change enalapril to 10 mg daily, add  chlorthalidone 25 mg daily for BP.  3. Patient education: Reviewed growth data. Discussed challenges and goals. Allen Harding and mom will try and get back into the [employer]. He seems very apathetic about making changes despite significant hypertension today in clinic.  4. Follow-up: 2 weeks for BP check, 1 month for f/u visit with [facility] T,  FNP-C  Level of Service: This visit lasted in excess of 25 minutes. More than 50% of the visit was devoted to counseling.

## 2014-07-28 NOTE — Patient Instructions (Addendum)
New blood pressure medication: chlorthalidone 25 mg in the morning Change enalapril to 10 mg once daily in the morning   Goals:  1. At least 2 vegetables a day.  2. Go at least 3 days a week to the [employer] and spend 15 minutes on the elliptical. You can do weights after that.   Labs prior to next visit- please complete post card at discharge.

## 2014-07-28 NOTE — Progress Notes (Signed)
Injection given in right deltoid, testosterone cypionate 100 mg. Lot [id number]; exp 9/17. KW

## 2014-08-25 ENCOUNTER — Ambulatory Visit (INDEPENDENT_AMBULATORY_CARE_PROVIDER_SITE_OTHER): Payer: Medicaid Other | Admitting: Pediatrics

## 2014-08-25 ENCOUNTER — Encounter: Payer: Self-pay | Admitting: Pediatrics

## 2014-08-25 VITALS — BP 162/104 | HR 123 | Wt 318.6 lb

## 2014-08-25 DIAGNOSIS — F341 Dysthymic disorder: Secondary | ICD-10-CM

## 2014-08-25 DIAGNOSIS — Z5181 Encounter for therapeutic drug level monitoring: Secondary | ICD-10-CM

## 2014-08-25 DIAGNOSIS — R7303 Prediabetes: Secondary | ICD-10-CM

## 2014-08-25 DIAGNOSIS — I10 Essential (primary) hypertension: Secondary | ICD-10-CM

## 2014-08-25 DIAGNOSIS — R7309 Other abnormal glucose: Secondary | ICD-10-CM | POA: Diagnosis not present

## 2014-08-25 DIAGNOSIS — E291 Testicular hypofunction: Secondary | ICD-10-CM | POA: Diagnosis not present

## 2014-08-25 LAB — COMPREHENSIVE METABOLIC PANEL
ALT: 26 U/L (ref 0–53)
AST: 20 U/L (ref 0–37)
Albumin: 4.2 g/dL (ref 3.5–5.2)
Alkaline Phosphatase: 112 U/L (ref 39–117)
BUN: 15 mg/dL (ref 6–23)
CALCIUM: 9.7 mg/dL (ref 8.4–10.5)
CHLORIDE: 95 meq/L — AB (ref 96–112)
CO2: 26 meq/L (ref 19–32)
Creat: 0.76 mg/dL (ref 0.50–1.35)
GLUCOSE: 84 mg/dL (ref 70–99)
Potassium: 4.2 mEq/L (ref 3.5–5.3)
SODIUM: 135 meq/L (ref 135–145)
Total Bilirubin: 0.4 mg/dL (ref 0.2–1.1)
Total Protein: 7.5 g/dL (ref 6.0–8.3)

## 2014-08-25 LAB — LIPID PANEL
Cholesterol: 199 mg/dL (ref 0–200)
HDL: 46 mg/dL (ref 31–65)
LDL Cholesterol: 134 mg/dL — ABNORMAL HIGH (ref 0–99)
Total CHOL/HDL Ratio: 4.3 Ratio
Triglycerides: 95 mg/dL (ref <150)
VLDL: 19 mg/dL (ref 0–40)

## 2014-08-25 MED ORDER — ENALAPRIL MALEATE 20 MG PO TABS: 20.0000 mg | ORAL_TABLET | Freq: Every day | ORAL | Status: DC

## 2014-08-25 NOTE — Patient Instructions (Signed)
Keep taking the chlorthalidone every day. We will increase the enalapril to 20 mg daily. You can take 2 of the 10 mg pills until you run out or you can just start with the new prescription.

## 2014-08-25 NOTE — Progress Notes (Signed)
Subjective:  Subjective Patient Name: Allen Harding Mallozzi Date of Birth: [birth year]  MRN: [id number]  Allen Harding Madej  presents to the office today for follow-up and management of his prediabetes, morbid obesity, hypertension, depression, dyspepsia, genetic chromosomal abnormality syndrome, precocity, gynecomastia, microphallus, hypogonadism, ADHD, tachycardia, goiter, behavior disorder, and hypercholesterolemia   HISTORY OF PRESENT ILLNESS:   Allen Harding is a 19 y.o. Caucasian male   Allen Harding was accompanied by his mother  1.  "[name]" was first referred to our clinic on 09/10/06 by his his cardiologist, Dr. Rosil[facility], for evaluation and management of prediabetes and obesity. He was 6011-1/20 years old. His medical history is significant for abnormal chromosomes and developmental delay with walking and speech development at about age 74 year. At age 19 he still had some gross motor motor and fine motor problems. The family had been concerned about his weight since the first grade.     2. The patient's last [facility] visit was on 07/28/14.   Pt reports he has been taking his medication every day in the morning. He is often missing his night medications. He has been coughing a lot. He has not been taking his Zyrtec. He hasn't noticed any changes with the testosterone. 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 He is not eating any better during the day. Mom is having a very hard time getting him up in the morning. He is still drinking crystal light and some diet soda. Denies headaches or blurry vision. He is having a lot of physical complaints when he doesn't want to do things. He sleeps well when he takes his trazodone.   [facility]. They will see her next month.     3. Pertinent Review of Systems:  Constitutional: The patient feels "good". He is not having headaches from his hypertension.   Eyes: Vision seems to be good. There are no  recognized eye problems. Wears glasses- eye exam in April 2015. Hasn't gotten new glasses yet.  Neck: The patient has no complaints of anterior neck swelling, soreness, tenderness, pressure, discomfort, or difficulty swallowing.   Heart: Heart rate increases with exercise or other physical activity. The patient has no complaints of palpitations, irregular heart beats, chest pain, or chest pressure.   Gastrointestinal: Bowel movents seem normal. The patient has no complaints of excessive hunger, acid reflux, upset stomach, stomach aches or pains, diarrhea, or constipation.  Legs: Muscle mass and strength seem normal. There are no complaints of numbness, tingling, burning, or pain. No edema is noted.  Feet: There are no obvious foot problems. There are no complaints of numbness, tingling, burning, or pain. No edema is noted. Neurologic: There are no recognized problems with muscle movement and strength, sensation, or coordination. GYN/GU: no nocturia if he takes his oxybutin- but otherwise still with primary enuresis. Had renal ultrasound and structurally normal  PAST MEDICAL, FAMILY, AND SOCIAL HISTORY  Past Medical History  Diagnosis Date  . Anxiety   . Diabetes mellitus   . 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 Obesity Mother   . Diabetes Mother   . Hypertension Mother   . Hyperlipidemia Mother   . Obesity Sister   . Obesity Maternal Grandmother   . Diabetes Maternal Grandmother   . Obesity Maternal Grandfather      Current outpatient prescriptions:  .  cetirizine (ZYRTEC) 10 MG tablet, TAKE ONE TABLET BY MOUTH DAILY., Disp: 30 tablet, Rfl: 5 .  chlorthalidone (HYGROTON) 25 MG tablet, Take 1 tablet (25 mg total) by mouth daily., Disp: 30 tablet, Rfl: 3 .  enalapril (VASOTEC) 5 MG tablet, Take 10 mg by mouth 2 (two) times daily. , Disp: , Rfl:  .  guanFACINE (INTUNIV) 2 MG TB24, Take 3 mg by mouth daily. ,  Disp: , Rfl:  .  lisdexamfetamine (VYVANSE) 20 MG capsule, Take 20 mg by mouth every morning.  , Disp: , Rfl:  .  metFORMIN (GLUCOPHAGE) 500 MG tablet, Take 2 tablets (1,000 mg total) by mouth 2 (two) times daily with a meal., Disp: 60 tablet, Rfl: 6 .  omeprazole (PRILOSEC) 40 MG capsule, Take 1 capsule (40 mg total) by mouth 2 (two) times daily., Disp: 60 capsule, Rfl: 6 .  oxybutynin (DITROPAN-XL) 10 MG 24 hr tablet, TAKE 1 TABLET BY MOUTH DAILY., Disp: 30 tablet, Rfl: 3 .  sertraline (ZOLOFT) 25 MG tablet, Take 100 mg by mouth daily. , Disp: , Rfl:  .  traZODone (DESYREL) 100 MG tablet, Take 100 mg by mouth at bedtime., Disp: , Rfl:  .  hydrOXYzine (ATARAX/VISTARIL) 25 MG tablet, Take 1 tablet (25 mg total) by mouth every 6 (six) hours as needed for itching. (Patient not taking: Reported on 08/25/2014), Disp: 30 tablet, Rfl: 0 .  nystatin cream (MYCOSTATIN), Apply topically 2 (two) times daily. To groin/ belt area (Patient not taking: Reported on 08/25/2014), Disp: 30 g, Rfl: 1 .  ondansetron (ZOFRAN-ODT) 4 MG disintegrating tablet, Take 1 tablet (4 mg total) by mouth every 8 (eight) hours as needed for nausea or vomiting. (Patient not taking: Reported on 08/25/2014), Disp: 20 tablet, Rfl: 0 .  testosterone cypionate (DEPOTESTOTERONE CYPIONATE) 200 MG/ML injection, Inject 0.5 mLs (100 mg total) into the muscle every 28 (twenty-eight) days. X 3 doses (Patient not taking: Reported on 08/25/2014), Disp: 10 mL, Rfl: 0 .  [DISCONTINUED] oxybutynin (DITROPAN) 5 MG tablet, Take 10 mg by mouth Nightly. , Disp: , Rfl:   Allergies as of 08/25/2014  . (No Known Allergies)     reports that he has never smoked. He has never used smokeless tobacco. He reports that he does not drink alcohol or use illicit drugs. Pediatric History  Patient Guardian Status  . Mother:  Devontre, Siedschlag   Other Topics Concern  . Not on file   Social History Narrative   Lives with mom, sister, 2 nieces, grandparents and sister's  fiance.    12th grade at [employer]   Primary Care Provider: [name], MD  ROS: There are no other significant problems involving Josiyah's other body systems.    Objective:  Objective Vital Signs:  BP 162/104 mmHg  Pulse 123  Wt 318 lb 9.6 oz (144.516 kg)   Ht Readings from Last 3 Encounters:  09/07/13 5\' 9"  (1.753 m) (44 %*, Z = -0.16)  08/19/13 5' 8.5" (1.74 m) (37 %*, Z = -0.33)  02/22/13 5' 8.9" (1.75 m) (44 %*, Z = -0.15)   * Growth percentiles are based on CDC 2-20 Years data.   Wt Readings from Last 3 Encounters:  08/25/14 318 lb 9.6 oz (144.516 kg) (100 %*, Z = 3.25)  07/28/14 322 lb (146.058 kg) (100 %*, Z = 3.28)  06/09/14 319 lb (144.697 kg) (100 %*, Z = 3.24)   * Growth percentiles are based on CDC 2-20 Years data.   HC Readings from Last 3  Encounters:  No data found for HC   There is no height on file to calculate BSA. No height on file for this encounter. 100%ile (Z=3.25) based on CDC 2-20 Years weight-for-age data using vitals from 08/25/2014.    PHYSICAL EXAM:  Constitutional: The patient appears healthy and well nourished. The patient's height and weight are advanced for age.  Head: The head is normocephalic. Face: The face appears normal. There are no obvious dysmorphic features. Eyes: The eyes appear to be normally formed and spaced. Gaze is conjugate. There is no obvious arcus or proptosis. Moisture appears normal. Ears: The ears are normally placed and appear externally normal. Mouth: The oropharynx and tongue appear normal. Dentition appears to be normal for age. Oral moisture is normal. Neck: The neck appears to be visibly normal. The thyroid gland is 18+ grams in size. The consistency of the thyroid gland is normal. The thyroid gland is not tender to palpation. Lungs: The lungs are clear to auscultation. Air movement is good. Heart: Heart rate and rhythm are regular. Heart sounds S1 and S2 are normal. I did not appreciate any  pathologic cardiac murmurs. Abdomen: The abdomen appears to be morbidly in size for the patient's age. Bowel sounds are normal. Unable to assess hepatomegaly, splenomegaly, or other mass effect.  Arms: Muscle size and bulk are normal for age. Hands: There is no obvious tremor. Phalangeal and metacarpophalangeal joints are normal. Palmar muscles are normal for age. Palmar skin is normal. Palmar moisture is also normal. Legs: Muscles appear normal for age. No edema is present. Feet: Feet are normally formed. Dorsalis pedal pulses are normal. Neurologic: Strength is normal for age in both the upper and lower extremities. Muscle tone is normal. Sensation to touch is normal in both the legs and feet.   GYN/GU: +2 gynecomastia  LAB DATA:   Results for orders placed or performed in visit on 07/28/14 (from the past 672 hour(s))  POCT Glucose (CBG)   Collection Time: 07/28/14  3:13 PM  Result Value Ref Range   POC Glucose 79 70 - 99 mg/dl  POCT HgB Z6X   Collection Time: 07/28/14  3:32 PM  Result Value Ref Range   Hemoglobin A1C 5.5    Results for orders placed or performed in visit on 08/25/14  Comprehensive metabolic panel  Result Value Ref Range   Sodium 135 135 - 145 mEq/L   Potassium 4.2 3.5 - 5.3 mEq/L   Chloride 95 (L) 96 - 112 mEq/L   CO2 26 19 - 32 mEq/L   Glucose, Bld 84 70 - 99 mg/dL   BUN 15 6 - 23 mg/dL   Creat 0.96 0.45 - 4.09 mg/dL   Total Bilirubin 0.4 0.2 - 1.1 mg/dL   Alkaline Phosphatase 112 39 - 117 U/L   AST 20 0 - 37 U/L   ALT 26 0 - 53 U/L   Total Protein 7.5 6.0 - 8.3 g/dL   Albumin 4.2 3.5 - 5.2 g/dL   Calcium 9.7 8.4 - 81.1 mg/dL  Lipid panel  Result Value Ref Range   Cholesterol 199 0 - 200 mg/dL   Triglycerides 95 <914 mg/dL   HDL 46 31 - 65 mg/dL   Total CHOL/HDL Ratio 4.3 Ratio   VLDL 19 0 - 40 mg/dL   LDL Cholesterol 782 (H) 0 - 99 mg/dL  TSH  Result Value Ref Range   TSH 1.638 0.350 - 4.500 uIU/mL  T4, free  Result Value Ref Range   Free T4  1.14 0.80 - 1.80 ng/dL  Luteinizing hormone  Result Value Ref Range   LH 6.6 mIU/mL  Follicle stimulating hormone  Result Value Ref Range   FSH 5.6 1.4 - 18.1 mIU/mL  Estradiol  Result Value Ref Range   Estradiol 34.2 pg/mL  Testosterone, free, total  Result Value Ref Range   Testosterone 128 (L) 300 - 890 ng/dL   Sex Hormone Binding 18 10 - 50 nmol/L   Testosterone, Free 32.3 (L) 47.0 - 244.0 pg/mL   Testosterone-% Free 2.5 1.6 - 2.9 %  Vit D  25 hydroxy (rtn osteoporosis monitoring)  Result Value Ref Range   Vit D, 25-Hydroxy 19 (L) 30 - 100 ng/mL       Assessment and Plan:  Assessment    ASSESSMENT:  1. Morbid obesity-has lost 3 pounds again.   2. Hypertension-despite addition of chlorthalidone at last visit, BP is increased. Will continue to monitor this closely during testosterone therapy.  3. Prediabetes- more consistent with metformin. A1C improved today.  4. Gynecomastia- consistent with continued morbid obesity. 5. Hypogonadism- Has now completed 3 month trial of testosterone. Feels like it has made minimal difference  6. Elevated triglycerides- much higher than last check- but labs not done fasting.   PLAN:  1. Diagnostic: Labs today for lipids, cmp, tfts, puberty labs. Testosterone continues to be persistently low despite trial. Will need to add testosterone 100 mg q2 weeks. Mom will come to office for teaching on giving injection with Kassina. Will repeat labs in one month midway between testosterone dosing for accurate values.  Will obtain CBC at this visit as well to monitor hematocrit.  2. Therapeutic: change enalapril to 20 mg daily, continue chlorthalidone 25 mg daily for BP.  3. Patient education: Reviewed growth data. Discussed challenges and goals. [name] and mom will try and get back into the [facility]. He seems very apathetic about making changes despite significant hypertension today in clinic. Hopefully testosterone will help with dysthymia.   4. Follow-up:  1 month with Kyung Rudd T, FNP-C  Level of Service: This visit lasted in excess of 25 minutes. More than 50% of the visit was devoted to counseling.

## 2014-08-26 LAB — T4, FREE: Free T4: 1.14 ng/dL (ref 0.80–1.80)

## 2014-08-26 LAB — ESTRADIOL: Estradiol: 34.2 pg/mL

## 2014-08-26 LAB — TESTOSTERONE, FREE, TOTAL, SHBG
Sex Hormone Binding: 18 nmol/L (ref 10–50)
TESTOSTERONE-% FREE: 2.5 % (ref 1.6–2.9)
Testosterone, Free: 32.3 pg/mL — ABNORMAL LOW (ref 47.0–244.0)
Testosterone: 128 ng/dL — ABNORMAL LOW (ref 300–890)

## 2014-08-26 LAB — FOLLICLE STIMULATING HORMONE: FSH: 5.6 m[IU]/mL (ref 1.4–18.1)

## 2014-08-26 LAB — TSH: TSH: 1.638 u[IU]/mL (ref 0.350–4.500)

## 2014-08-26 LAB — LUTEINIZING HORMONE: LH: 6.6 m[IU]/mL

## 2014-08-26 LAB — VITAMIN D 25 HYDROXY (VIT D DEFICIENCY, FRACTURES): Vit D, 25-Hydroxy: 19 ng/mL — ABNORMAL LOW (ref 30–100)

## 2014-08-26 MED ORDER — LETROZOLE 2.5 MG PO TABS: 2.5000 mg | ORAL_TABLET | Freq: Every day | ORAL | Status: DC

## 2014-09-02 ENCOUNTER — Telehealth: Payer: Self-pay | Admitting: *Deleted

## 2014-09-02 NOTE — Telephone Encounter (Signed)
Spoke to mom, advised that per [place] Citizenaroline Stewart's testosterone continues to be quite low despite our 3 month trial to see if we could stimulate his body to make more. He would benefit from continued treatment for this problem. Two options are possible-- daily aromitase inhibitor therapy by mouth or testosterone injections every 2 weeks. It would be beneficial for the family if they could learn how to do these at home. We can try the aromitase inhibitor route if that is what they prefer, however, we are not as likely to have as good of a response as we would get with the testosterone therapy. His vitamin D is also low. She should give him a supplement that has at least 5,000 IU daily. Please discuss with mom if she feels like she could learn injections or we need to proceed with oral treatment. Mom advises she would come Wed. 4/6 and give his first shot with me present.

## 2014-09-06 DIAGNOSIS — Z5181 Encounter for therapeutic drug level monitoring: Secondary | ICD-10-CM | POA: Insufficient documentation

## 2014-09-06 MED ORDER — TESTOSTERONE CYPIONATE 200 MG/ML IM SOLN: 100.0000 mg | INTRAMUSCULAR | Status: DC

## 2014-09-07 ENCOUNTER — Encounter: Payer: Self-pay | Admitting: *Deleted

## 2014-09-13 ENCOUNTER — Ambulatory Visit (INDEPENDENT_AMBULATORY_CARE_PROVIDER_SITE_OTHER): Payer: Medicaid Other | Admitting: Pediatrics

## 2014-09-13 ENCOUNTER — Encounter: Payer: Self-pay | Admitting: Pediatrics

## 2014-09-13 VITALS — BP 128/78 | Ht 68.5 in | Wt 324.0 lb

## 2014-09-13 DIAGNOSIS — Z00121 Encounter for routine child health examination with abnormal findings: Secondary | ICD-10-CM | POA: Diagnosis not present

## 2014-09-13 DIAGNOSIS — Z68.41 Body mass index (BMI) pediatric, greater than or equal to 95th percentile for age: Secondary | ICD-10-CM | POA: Diagnosis not present

## 2014-09-13 NOTE — Progress Notes (Signed)
Routine Well-Adolescent Visit  PCP: Allen Adler, MD   History was provided by the patient and mother.  Allen Harding is a 19 y.o. male who is here for his annual well visit.  Current concerns: -Has a complex hx which includes DMII and obesity being followed by endocrinology, gynecomastia, hypogonadism currently being treated on testosterone, hypertension and hyperlipidemia being seen in Cardiology for, dysthymia for which he is seeing [facility] and bad allergic rhinitis. Is currently doing well with services. Mom looking to transition him to an internist soon who can help coordinate and treat him. Also on vitamin D and letrozole. -Going to be graduating soon and then will start looking for a job.  Adolescent Assessment:  Confidentiality was discussed with the patient and if applicable, with caregiver as well.  Home and Environment:  Lives with: Grandparents, mother, an uncle Parental relations: Good Friends/Peers: Good  Nutrition/Eating Behaviors: tries to eat a good variety including fruits and veggies Sports/Exercise:  Not much  Education and Employment:  School Status: in 12th grade in regular classroom and is doing well School History: School attendance is regular. 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 Yes  Smoking: No Secondhand smoke exposure? yes - Uncle smokes outside but not often Drugs/EtOH: None  Menstruation:   Menarche: not applicable in this male child.  Sexuality: Heterosexual Sexually active? no  sexual partners in last year:0, denies any sexual activity in present or past contraception use: abstinence Last STI Screening: None; recommend calling home number if results positive  Violence/Abuse: No Mood: Suicidality and Depression:  Has a hx of depression being seen in [facility], denies SI or homicidal ideation Weapons: No  Screenings: The patient completed the Rapid Assessment for Adolescent Preventive Services screening questionnaire and the following topics were identified as risk factors and discussed: Not done but we discussed healthy eating, exercise, abuse/trauma, weapon use, tobacco use, marijuana use, drug use, condom use, sexuality and mental health issues   ROS: Gen: Negative HEENT: +URI symptoms CV: +HTN Resp: Negative GI: Negative GU: No penile drainage/discharge; +hypogonadism Neuro: +autism, +dysthymia Skin: Negative  Physical Exam:  BP 128/78 mmHg  Ht 5' 8.5" (1.74 m)  Wt 324 lb (146.965 kg)  BMI 48.54 kg/m2 Blood pressure percentiles are 74% systolic and 56% diastolic based on 2000 NHANES data.   General Appearance:   alert, oriented, no acute distress, well nourished and obese  HENT: Normocephalic, no obvious abnormality, conjunctiva clear  Mouth:   Normal appearing teeth, no obvious discoloration, dental caries, or dental caps  Neck:   Supple  Lungs:   Clear to auscultation bilaterally, normal work of breathing  Heart:   Regular rate and rhythm, S1 and S2 normal, no murmurs;   Abdomen:   Soft, non-tender, no mass, or organomegaly  GU genitalia not examined  Musculoskeletal:   Tone and strength strong and symmetrical, all extremities               Lymphatic:   No cervical adenopathy  Skin/Hair/Nails:   Skin WWP  Neurologic:   Strength, gait, and coordination normal and age-appropriate    Assessment/Plan: -Discussed continuing current management as per specialists. To see Cardiology soon and while there will have hyperlipidemia addressed - Continue Vitamin D - Mom to work on having Allen Harding transitioned to adult  provider; will let us know if she needs anything to help -Urine gonorrhea and chlamydia sent  BMI: is not appropriate for age  Immunizations today: None  - Follow-up visit in 3  months for next visit, or sooner as needed.   Allen ShadowKavithashree Dhilan Brauer, MD

## 2014-09-14 ENCOUNTER — Ambulatory Visit: Payer: Medicaid Other | Admitting: Pediatric Endocrinology

## 2014-09-14 LAB — GC/CHLAMYDIA PROBE AMP, URINE
Chlamydia, Swab/Urine, PCR: NEGATIVE
GC Probe Amp, Urine: NEGATIVE

## 2014-09-28 ENCOUNTER — Ambulatory Visit (INDEPENDENT_AMBULATORY_CARE_PROVIDER_SITE_OTHER): Payer: Medicaid Other | Admitting: Pediatrics

## 2014-09-28 ENCOUNTER — Encounter: Payer: Self-pay | Admitting: Pediatrics

## 2014-09-28 VITALS — BP 143/91 | HR 116 | Wt 321.4 lb

## 2014-09-28 DIAGNOSIS — N62 Hypertrophy of breast: Secondary | ICD-10-CM

## 2014-09-28 DIAGNOSIS — R7303 Prediabetes: Secondary | ICD-10-CM

## 2014-09-28 DIAGNOSIS — R7309 Other abnormal glucose: Secondary | ICD-10-CM

## 2014-09-28 DIAGNOSIS — I10 Essential (primary) hypertension: Secondary | ICD-10-CM

## 2014-09-28 DIAGNOSIS — E291 Testicular hypofunction: Secondary | ICD-10-CM

## 2014-09-28 LAB — POCT GLYCOSYLATED HEMOGLOBIN (HGB A1C): HEMOGLOBIN A1C: 5.7

## 2014-09-28 LAB — GLUCOSE, POCT (MANUAL RESULT ENTRY): POC GLUCOSE: 110 mg/dL — AB (ref 70–99)

## 2014-09-28 NOTE — Patient Instructions (Addendum)
Cardiology will see you next month for your appointment.   We will see you back in 2 months. We will call you with labs. Continue working on taking your medications consistently.

## 2014-09-28 NOTE — Progress Notes (Signed)
Subjective:  Subjective Patient Name: Allen Harding Date of Birth: 1995-06-27  MRN: [id number]  Allen Harding  presents to the office today for follow-up and management of his prediabetes, morbid obesity, hypertension, depression, dyspepsia, genetic chromosomal abnormality syndrome, precocity, gynecomastia, microphallus, hypogonadism, ADHD, tachycardia, goiter, behavior disorder, and hypercholesterolemia   HISTORY OF PRESENT ILLNESS:   Allen Harding is a 19 y.o. Caucasian male   Jamario was accompanied by his mother  1.  "Allen Harding" was first referred to our clinic on 09/10/06 by his his cardiologist, Dr. [name] of [facility], for evaluation and management of prediabetes and obesity. He was 36-1/20 years old. His medical history is significant for abnormal chromosomes and developmental delay with walking and speech development at about age 89 year. At age 5 he still had some gross motor motor and fine motor problems. The family had been concerned about his weight since the first grade.     2. The patient's last [facility] visit was on 08/25/14.   Mom reports that things are ok. Still trying to get him graduated. He is starting to shut down and not do stuff. Mom is concerned about the hypogonadism diagnosis and what that will mean for his future childbearing possibilities. Testosterone shots are going ok. Second was last Wednesday-- did labs this AM. He has noticed a little more facial hair on his face. He went to prom last weekend. MD needed to reschedule youth haven. Mom having trouble getting him to take his night medication. He is taking his medications for his blood pressure. He follows up with cardiology May 26th. No new issues with anger or aggression noted at home.    3. Pertinent Review of Systems:  Constitutional: The patient feels "good". He is not having headaches from his hypertension.   Eyes: Vision seems to be good. There are no recognized eye problems. Wears glasses- eye exam in April 2015. Hasn't gotten  new glasses yet.  Neck: The patient has no complaints of anterior neck swelling, soreness, tenderness, pressure, discomfort, or difficulty swallowing.   Heart: Heart rate increases with exercise or other physical activity. The patient has no complaints of palpitations, irregular heart beats, chest pain, or chest pressure.   Gastrointestinal: Bowel movents seem normal. The patient has no complaints of excessive hunger, acid reflux, upset stomach, stomach aches or pains, diarrhea, or constipation.  Legs: Muscle mass and strength seem normal. There are no complaints of numbness, tingling, burning, or pain. No edema is noted.  Feet: There are no obvious foot problems. There are no complaints of numbness, tingling, burning, or pain. No edema is noted. Neurologic: There are no recognized problems with muscle movement and strength, sensation, or coordination. GYN/GU: no nocturia if he takes his oxybutin- but otherwise still with primary enuresis. Had renal ultrasound and structurally normal  PAST MEDICAL, FAMILY, AND SOCIAL HISTORY  Past Medical History  Diagnosis Date  . Anxiety   . Diabetes mellitus   . 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 Obesity Mother   . Diabetes Mother   . Hypertension Mother   . Hyperlipidemia Mother   . Obesity Sister   . Obesity Maternal Grandmother   . Diabetes Maternal Grandmother   . Obesity Maternal Grandfather      Current outpatient prescriptions:  .  cetirizine (ZYRTEC) 10 MG tablet, TAKE ONE TABLET BY MOUTH DAILY., Disp: 30 tablet, Rfl: 5 .  cholecalciferol (VITAMIN D) 400 UNITS TABS  tablet, Take 5,000 Units by mouth daily., Disp: , Rfl:  .  enalapril (VASOTEC) 20 MG tablet, Take 1 tablet (20 mg total) by mouth daily., Disp: 30 tablet, Rfl: 3 .  guanFACINE (INTUNIV) 2 MG TB24, Take 2 mg by mouth daily. , Disp: , Rfl:  .  hydrOXYzine (ATARAX/VISTARIL) 25 MG tablet, Take 1 tablet (25  mg total) by mouth every 6 (six) hours as needed for itching., Disp: 30 tablet, Rfl: 0 .  letrozole (FEMARA) 2.5 MG tablet, Take 2.5 mg by mouth daily., Disp: , Rfl:  .  lisdexamfetamine (VYVANSE) 20 MG capsule, Take 70 mg by mouth every morning. , Disp: , Rfl:  .  metFORMIN (GLUCOPHAGE) 500 MG tablet, Take 2 tablets (1,000 mg total) by mouth 2 (two) times daily with a meal., Disp: 60 tablet, Rfl: 6 .  omeprazole (PRILOSEC) 40 MG capsule, Take 1 capsule (40 mg total) by mouth 2 (two) times daily., Disp: 60 capsule, Rfl: 6 .  oxybutynin (DITROPAN) 5 MG tablet, Take 10 mg by mouth., Disp: , Rfl:  .  oxybutynin (DITROPAN-XL) 10 MG 24 hr tablet, TAKE 1 TABLET BY MOUTH DAILY., Disp: 30 tablet, Rfl: 3 .  sertraline (ZOLOFT) 25 MG tablet, Take 100 mg by mouth daily. , Disp: , Rfl:  .  testosterone cypionate (DEPOTESTOTERONE CYPIONATE) 200 MG/ML injection, Inject 0.5 mLs (100 mg total) into the muscle every 14 (fourteen) days., Disp: 10 mL, Rfl: 0 .  traZODone (DESYREL) 100 MG tablet, Take 100 mg by mouth at bedtime., Disp: , Rfl:  .  chlorthalidone (HYGROTON) 25 MG tablet, Take 1 tablet (25 mg total) by mouth daily. (Patient not taking: Reported on 09/28/2014), Disp: 30 tablet, Rfl: 3 .  nystatin cream (MYCOSTATIN), Apply topically 2 (two) times daily. To groin/ belt area (Patient not taking: Reported on 08/25/2014), Disp: 30 g, Rfl: 1 .  ondansetron (ZOFRAN-ODT) 4 MG disintegrating tablet, Take 1 tablet (4 mg total) by mouth every 8 (eight) hours as needed for nausea or vomiting. (Patient not taking: Reported on 08/25/2014), Disp: 20 tablet, Rfl: 0 .  [DISCONTINUED] oxybutynin (DITROPAN) 5 MG tablet, Take 10 mg by mouth Nightly. , Disp: , Rfl:   Allergies as of 09/28/2014  . (No Known Allergies)     reports that he has never smoked. He has never used smokeless tobacco. He reports that he does not drink alcohol or use illicit drugs. Pediatric History  Patient Guardian Status  . Mother:  [place], [place]    Other Topics Concern  . Not on file   Social History Narrative   Lives with mom, sister, 2 nieces, grandparents and sister's fiance.    12th grade at [employer]   Primary Care Provider: Shaaron Adler, MD  ROS: There are no other significant problems involving Timathy's other body systems.    Objective:  Objective Vital Signs:  BP 143/91 mmHg  Pulse 116  Wt 321 lb 6.4 oz (145.786 kg)   Ht Readings from Last 3 Encounters:  09/13/14 5' 8.5" (1.74 m) (35 %*, Z = -0.38)  09/07/13  (1.753 m) (44 %*, Z = -0.16)  08/19/13 5' 8.5" (1.74 m) (37 %*, Z = -0.33)   * Growth percentiles are based on CDC 2-20 Years data.   Wt Readings from Last 3 Encounters:  09/28/14 321 lb 6.4 oz (145.786 kg) (100 %*, Z = 3.29)  09/13/14 324 lb (146.965 kg) (100 %*, Z = 3.31)  08/25/14 318 lb 9.6 oz (144.516 kg) (100 %*, Z =  3.25)   * Growth percentiles are based on CDC 2-20 Years data.   HC Readings from Last 3 Encounters:  No data found for [facility]   There is no height on file to calculate BSA. No height on file for this encounter. 100%ile (Z=3.29) based on CDC 2-20 Years weight-for-age data using vitals from 09/28/2014.    PHYSICAL EXAM:  Constitutional: The patient appears healthy and well nourished. The patient's height and weight are advanced for age.  Head: The head is normocephalic. Face: The face appears normal. There are no obvious dysmorphic features. Eyes: The eyes appear to be normally formed and spaced. Gaze is conjugate. There is no obvious arcus or proptosis. Moisture appears normal. Ears: The ears are normally placed and appear externally normal. Mouth: The oropharynx and tongue appear normal. Dentition appears to be normal for age. Oral moisture is normal. Neck: The neck appears to be visibly normal. The thyroid gland is 18+ grams in size. The consistency of the thyroid gland is normal. The thyroid gland is not tender to palpation. Lungs: The lungs are  clear to auscultation. Air movement is good. Heart: Heart rate and rhythm are regular. Heart sounds S1 and S2 are normal. I did not appreciate any pathologic cardiac murmurs. Abdomen: The abdomen appears to be morbidly in size for the patient's age. Bowel sounds are normal. Unable to assess hepatomegaly, splenomegaly, or other mass effect.  Arms: Muscle size and bulk are normal for age. Hands: There is no obvious tremor. Phalangeal and metacarpophalangeal joints are normal. Palmar muscles are normal for age. Palmar skin is normal. Palmar moisture is also normal. Legs: Muscles appear normal for age. No edema is present. Feet: Feet are normally formed. Dorsalis pedal pulses are normal. Neurologic: Strength is normal for age in both the upper and lower extremities. Muscle tone is normal. Sensation to touch is normal in both the legs and feet.   GYN/GU: +2 gynecomastia  LAB DATA:   Results for orders placed or performed in visit on 09/28/14 (from the past 672 hour(s))  POCT Glucose (CBG)   Collection Time: 09/28/14  3:52 PM  Result Value Ref Range   POC Glucose 110 (A) 70 - 99 mg/dl  POCT HgB Z6X   Collection Time: 09/28/14  4:03 PM  Result Value Ref Range   Hemoglobin A1C 5.7   Results for orders placed or performed in visit on 09/13/14 (from the past 672 hour(s))  GC/chlamydia probe amp, urine   Collection Time: 09/13/14  4:57 PM  Result Value Ref Range   Chlamydia, Swab/Urine, PCR NEGATIVE NEGATIVE   GC Probe Amp, Urine NEGATIVE NEGATIVE   Results for orders placed or performed in visit on 09/28/14  POCT Glucose (CBG)  Result Value Ref Range   POC Glucose 110 (A) 70 - 99 mg/dl  POCT HgB W9U  Result Value Ref Range   Hemoglobin A1C 5.7        Assessment and Plan:  Assessment    ASSESSMENT:  1. Morbid obesity- weight continues to fluctuate. .   2. Hypertension- BP was normal at PCP visit. Is slightly elevated today but much better than previous visits.  3. Prediabetes-  continues to be prediabetic. Taking metformin usually only once a day.   4. Gynecomastia- consistent with continued morbid obesity. 5. Hypogonadism- Now on testosterone every other week 100 mg. Had labs drawn this AM. Will make dose adjustment as needed. CBC ok today-- slightly anemic.  6. Elevated triglycerides- much higher than last check- but  labs not done fasting.   PLAN:  1. Diagnostic: Repeat testosterone labs in 2 months  2. Therapeutic: Continue enalapril to 20 mg daily, continue chlorthalidone 25 mg daily for BP. Continue metformin and testosterone.  3. Patient education: Reviewed growth data. Discussed challenges and goals. Allen RenoStewart will work on getting graduated before next appointment. Will space to 2 months with cardiology in the middle. Will call mom with lab results. Discussed role of hypogonadism in childbearing and no need to do any kind of semen analysis at this time-- recommended reproductive endocrinology consult if/when he gets to this time in his life. Will continue to seek adult internal med who may be able to manage all his care through adulthood. 4. Follow-up: 2 months       Hedda Crumbley T, FNP  Level of Service: This visit lasted in excess of 25 minutes. More than 50% of the visit was devoted to counseling.

## 2014-09-29 ENCOUNTER — Other Ambulatory Visit: Payer: Self-pay | Admitting: Pediatrics

## 2014-09-29 DIAGNOSIS — E291 Testicular hypofunction: Secondary | ICD-10-CM

## 2014-09-29 DIAGNOSIS — D509 Iron deficiency anemia, unspecified: Secondary | ICD-10-CM | POA: Insufficient documentation

## 2014-09-29 LAB — TESTOSTERONE, FREE, TOTAL, SHBG
Sex Hormone Binding: 12 nmol/L (ref 10–50)
TESTOSTERONE FREE: 155.9 pg/mL (ref 47.0–244.0)
Testosterone-% Free: 3.2 % — ABNORMAL HIGH (ref 1.6–2.9)
Testosterone: 487 ng/dL (ref 300–890)

## 2014-09-29 LAB — CBC
HCT: 37.7 % — ABNORMAL LOW (ref 39.0–52.0)
HEMOGLOBIN: 11.9 g/dL — AB (ref 13.0–17.0)
MCH: 26.9 pg (ref 26.0–34.0)
MCHC: 31.6 g/dL (ref 30.0–36.0)
MCV: 85.3 fL (ref 78.0–100.0)
MPV: 9 fL (ref 8.6–12.4)
Platelets: 427 10*3/uL — ABNORMAL HIGH (ref 150–400)
RBC: 4.42 MIL/uL (ref 4.22–5.81)
RDW: 15.3 % (ref 11.5–15.5)
WBC: 11 10*3/uL — ABNORMAL HIGH (ref 4.0–10.5)

## 2014-09-29 LAB — ESTRADIOL: Estradiol: 25.3 pg/mL

## 2014-09-29 MED ORDER — TESTOSTERONE CYPIONATE 200 MG/ML IM SOLN: 150.0000 mg | INTRAMUSCULAR | Status: DC

## 2014-09-29 MED ORDER — FERROUS SULFATE 325 (65 FE) MG PO TABS: 325.0000 mg | ORAL_TABLET | Freq: Every day | ORAL | Status: DC

## 2014-10-17 ENCOUNTER — Telehealth: Payer: Self-pay | Admitting: *Deleted

## 2014-10-17 NOTE — Telephone Encounter (Signed)
Spoke to mother, advised that per Alfonso Ramusaroline Hacker FNP   Stewart's testosterone level has improved significantly but we will need to increase the dose slightly to reach our goal of 500-600. He should start with his next dose taking 150 mg every other week. This is 0.75 ml. His blood count also shows that he is slightly anemic. He should start taking an iron supplement once daily. I have sent this to the pharmacy. We will recheck his labs when he returns in 2 months to see us. I advised for her to go 3 slashes past the 1/2 mark on the syringe and to call me when she gets home and can look at it if she has any questions.

## 2014-11-02 ENCOUNTER — Other Ambulatory Visit: Payer: Self-pay | Admitting: *Deleted

## 2014-11-02 DIAGNOSIS — E291 Testicular hypofunction: Secondary | ICD-10-CM

## 2014-11-02 MED ORDER — TESTOSTERONE CYPIONATE 200 MG/ML IM SOLN: 150.0000 mg | INTRAMUSCULAR | Status: DC

## 2014-11-21 ENCOUNTER — Ambulatory Visit (HOSPITAL_COMMUNITY)
Admission: RE | Admit: 2014-11-21 | Discharge: 2014-11-21 | Disposition: A | Discharge status: Home / Self Care | Payer: MEDICAID | Attending: Psychiatry | Admitting: Psychiatry
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
## 2014-12-12 ENCOUNTER — Other Ambulatory Visit: Payer: Self-pay | Admitting: Pediatric Endocrinology

## 2014-12-14 ENCOUNTER — Encounter: Payer: Self-pay | Admitting: Pediatrics

## 2014-12-14 ENCOUNTER — Ambulatory Visit (INDEPENDENT_AMBULATORY_CARE_PROVIDER_SITE_OTHER): Payer: Medicaid Other | Admitting: Pediatrics

## 2014-12-14 VITALS — BP 142/96 | Temp 97.6°F | Wt 335.0 lb

## 2014-12-14 DIAGNOSIS — J3081 Allergic rhinitis due to animal (cat) (dog) hair and dander: Secondary | ICD-10-CM | POA: Diagnosis not present

## 2014-12-14 DIAGNOSIS — F341 Dysthymic disorder: Secondary | ICD-10-CM | POA: Diagnosis not present

## 2014-12-14 DIAGNOSIS — R7309 Other abnormal glucose: Secondary | ICD-10-CM

## 2014-12-14 DIAGNOSIS — Z68.41 Body mass index (BMI) pediatric, greater than or equal to 95th percentile for age: Secondary | ICD-10-CM

## 2014-12-14 DIAGNOSIS — R7303 Prediabetes: Secondary | ICD-10-CM

## 2014-12-14 DIAGNOSIS — IMO0002 Reserved for concepts with insufficient information to code with codable children: Secondary | ICD-10-CM

## 2014-12-14 DIAGNOSIS — J301 Allergic rhinitis due to pollen: Secondary | ICD-10-CM | POA: Diagnosis not present

## 2014-12-14 DIAGNOSIS — R32 Unspecified urinary incontinence: Secondary | ICD-10-CM | POA: Diagnosis not present

## 2014-12-14 DIAGNOSIS — J309 Allergic rhinitis, unspecified: Secondary | ICD-10-CM | POA: Insufficient documentation

## 2014-12-14 DIAGNOSIS — I10 Essential (primary) hypertension: Secondary | ICD-10-CM | POA: Diagnosis not present

## 2014-12-14 MED ORDER — CETIRIZINE HCL 10 MG PO TABS: ORAL_TABLET | ORAL | Status: DC

## 2014-12-14 MED ORDER — OXYBUTYNIN CHLORIDE ER 10 MG PO TB24: ORAL_TABLET | ORAL | Status: DC

## 2014-12-14 NOTE — Patient Instructions (Signed)
Hypertension Hypertension, commonly called high blood pressure, is when the force of blood pumping through your arteries is too strong. Your arteries are the blood vessels that carry blood from your heart throughout your body. A blood pressure reading consists of a higher number over a lower number, such as 110/72. The higher number (systolic) is the pressure inside your arteries when your heart pumps. The lower number (diastolic) is the pressure inside your arteries when your heart relaxes. Ideally you want your blood pressure below 120/80. Hypertension forces your heart to work harder to pump blood. Your arteries may become narrow or stiff. Having hypertension puts you at risk for heart disease, stroke, and other problems.  RISK FACTORS Some risk factors for high blood pressure are controllable. Others are not.  Risk factors you cannot control include:   Race. You may be at higher risk if you are African American.  Age. Risk increases with age.  Gender. Men are at higher risk than women before age 45 years. After age 65, women are at higher risk than men. Risk factors you can control include:  Not getting enough exercise or physical activity.  Being overweight.  Getting too much fat, sugar, calories, or salt in your diet.  Drinking too much alcohol. SIGNS AND SYMPTOMS Hypertension does not usually cause signs or symptoms. Extremely high blood pressure (hypertensive crisis) may cause headache, anxiety, shortness of breath, and nosebleed. DIAGNOSIS  To check if you have hypertension, your health care provider will measure your blood pressure while you are seated, with your arm held at the level of your heart. It should be measured at least twice using the same arm. Certain conditions can cause a difference in blood pressure between your right and left arms. A blood pressure reading that is higher than normal on one occasion does not mean that you need treatment. If one blood pressure reading  is high, ask your health care provider about having it checked again. TREATMENT  Treating high blood pressure includes making lifestyle changes and possibly taking medicine. Living a healthy lifestyle can help lower high blood pressure. You may need to change some of your habits. Lifestyle changes may include:  Following the DASH diet. This diet is high in fruits, vegetables, and whole grains. It is low in salt, red meat, and added sugars.  Getting at least 2 hours of brisk physical activity every week.  Losing weight if necessary.  Not smoking.  Limiting alcoholic beverages.  Learning ways to reduce stress. If lifestyle changes are not enough to get your blood pressure under control, your health care provider may prescribe medicine. You may need to take more than one. Work closely with your health care provider to understand the risks and benefits. HOME CARE INSTRUCTIONS  Have your blood pressure rechecked as directed by your health care provider.   Take medicines only as directed by your health care provider. Follow the directions carefully. Blood pressure medicines must be taken as prescribed. The medicine does not work as well when you skip doses. Skipping doses also puts you at risk for problems.   Do not smoke.   Monitor your blood pressure at home as directed by your health care provider. SEEK MEDICAL CARE IF:   You think you are having a reaction to medicines taken.  You have recurrent headaches or feel dizzy.  You have swelling in your ankles.  You have trouble with your vision. SEEK IMMEDIATE MEDICAL CARE IF:  You develop a severe headache or confusion.    You have unusual weakness, numbness, or feel faint.  You have severe chest or abdominal pain.  You vomit repeatedly.  You have trouble breathing. MAKE SURE YOU:   Understand these instructions.  Will watch your condition.  Will get help right away if you are not doing well or get worse. Document  Released: 05/20/2005 Document Revised: 10/04/2013 Document Reviewed: 03/12/2013 ExitCare Patient Information 2015 [employer]. This information is not intended to replace advice given to you by your health care provider. Make sure you discuss any questions you have with your health care provider. Diabetes Mellitus and Food It is important for you to manage your blood sugar (glucose) level. Your blood glucose level can be greatly affected by what you eat. Eating healthier foods in the appropriate amounts throughout the day at about the same time each day will help you control your blood glucose level. It can also help slow or prevent worsening of your diabetes mellitus. Healthy eating may even help you improve the level of your blood pressure and reach or maintain a healthy weight.  HOW CAN FOOD AFFECT ME? Carbohydrates Carbohydrates affect your blood glucose level more than any other type of food. Your dietitian will help you determine how many carbohydrates to eat at each meal and teach you how to count carbohydrates. Counting carbohydrates is important to keep your blood glucose at a healthy level, especially if you are using insulin or taking certain medicines for diabetes mellitus. Alcohol Alcohol can cause sudden decreases in blood glucose (hypoglycemia), especially if you use insulin or take certain medicines for diabetes mellitus. Hypoglycemia can be a life-threatening condition. Symptoms of hypoglycemia (sleepiness, dizziness, and disorientation) are similar to symptoms of having too much alcohol.  If your health care provider has given you approval to drink alcohol, do so in moderation and use the following guidelines:  Women should not have more than one drink per day, and men should not have more than two drinks per day. One drink is equal to:  12 oz of beer.  5 oz of wine.  1 oz of hard liquor.  Do not drink on an empty stomach.  Keep yourself hydrated. Have water, diet soda, or  unsweetened iced tea.  Regular soda, juice, and other mixers might contain a lot of carbohydrates and should be counted. WHAT FOODS ARE NOT RECOMMENDED? As you make food choices, it is important to remember that all foods are not the same. Some foods have fewer nutrients per serving than other foods, even though they might have the same number of calories or carbohydrates. It is difficult to get your body what it needs when you eat foods with fewer nutrients. Examples of foods that you should avoid that are high in calories and carbohydrates but low in nutrients include:  Trans fats (most processed foods list trans fats on the Nutrition Facts label).  Regular soda.  Juice.  Candy.  Sweets, such as cake, pie, doughnuts, and cookies.  Fried foods. WHAT FOODS CAN I EAT? Have nutrient-rich foods, which will nourish your body and keep you healthy. The food you should eat also will depend on several factors, including:  The calories you need.  The medicines you take.  Your weight.  Your blood glucose level.  Your blood pressure level.  Your cholesterol level. You also should eat a variety of foods, including:  Protein, such as meat, poultry, fish, tofu, nuts, and seeds (lean animal proteins are best).  Fruits.  Vegetables.  Dairy products, such as   milk, cheese, and yogurt (low fat is best).  Breads, grains, pasta, cereal, rice, and beans.  Fats such as olive oil, trans fat-free margarine, canola oil, avocado, and olives. DOES EVERYONE WITH DIABETES MELLITUS HAVE THE SAME MEAL PLAN? Because every person with diabetes mellitus is different, there is not one meal plan that works for everyone. It is very important that you meet with a dietitian who will help you create a meal plan that is just right for you. Document Released: 02/14/2005 Document Revised: 05/25/2013 Document Reviewed: 04/16/2013 ExitCare Patient Information 2015 [employer]. This information is not intended  to replace advice given to you by your health care provider. Make sure you discuss any questions you have with your health care provider.  

## 2014-12-14 NOTE — Progress Notes (Signed)
11:07 AM 11:38 AM Chief Complaint  Patient presents with  . Follow-up    HPI Allen Harding here for follow-up. Pt has complex medical history. He is dev disability, testicular dysfunction hypertension, and dysthymia,  He is followed by multiple specialists. Including cardiology for HTN, His enalpril was recently increased.Mom brought updated med list. He is followed at [facility] for dysthymia. He has been having increased issues due to his Grandmother's health, -she has terminal lung cancer He had an episode as his counselor that he threatened to kill himself. He was seen at behavioral health and released that day. He states that it was just something he says when upset. But has no plan or intention to act on the threat.  Mother is concerned about pts wgt gain, he stated he is a stress eater. History was provided by the mother. patient.  U ROS:     Constitutional  Afebrile, normal appetite, normal activity.   Opthalmologic  no irritation or drainage.   ENT  no rhinorrhea or congestion , no sore throat, no ear pain. Cardiovascular  No chest pain Respiratory  no cough , wheeze or chest pain.  Gastointestinal  no abdominal pain, nausea or vomiting, bowel movements normal.   Genitourinary  Voiding normally  Musculoskeletal  no complaints of pain, no injuries.   Dermatologic  no rashes or lesions Neurologic - no significant history of headaches, no weakness  family history includes Cancer in his maternal grandmother; Diabetes in his maternal grandmother and mother; Hyperlipidemia in his mother; Hypertension in his mother; Obesity in his maternal grandfather, maternal grandmother, mother, and sister; Stroke in his maternal grandmother.   BP 142/96 mmHg  Temp(Src) 97.6 F (36.4 C)  Wt 335 lb (151.955 kg)    Objective:         General alert in NAD  Derm   no rashes or lesions  Head Normocephalic, atraumatic                    Eyes Normal, no discharge  Ears:   TMs normal  bilaterally  Nose:   patent normal mucosa, turbinates normal, no rhinorhea  Oral cavity  moist mucous membranes, no lesions  Throat:   normal tonsils, without exudate or erythema  Neck supple FROM  Lymph:   no significant cervicaladenopathy  Lungs:  clear with equal breath sounds bilaterally  Heart:   regular rate and rhythm, no murmur  Abdomen:  soft nontender no organomegaly or masses  GU:  deferred  back No deformity  Extremities:   no deformity  Neuro:  intact no focal defects        Assessment/plan    1. BMI (body mass index), pediatric, > 99% for age Weight is increasing, he states he is a stress eater, mother does limit  "junk" food but pt will overeat cold cuts and cheese, Prefers soda to drink , will drink some waters at time da,   2. Dysthymia Is in counseling, has had increased stress MGM has terminal lung cancer. Pt admits to feeling overwhelmed and has states that he says he wants to die at those times but he has no intention or plan  3. Pre-diabetes Reviewed diet   4. Essential hypertension, benign Followed by cardiology, has not achieved good control  5. Enuresis Needs refill on d - oxybutynin (DITROPAN-XL) 10 MG 24 hr tablet; TAKE 1 TABLET BY MOUTH DAILY.  Dispense: 30 tablet; Refill: 3  6. 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 Allergic rhinitis due to pollen Needs refill on - cetirizine (ZYRTEC) 10 MG tablet; TAKE ONE TABLET BY MOUTH DAILY.  Dispense: 30 tablet; Refill: 5    Follow up  Return in about 6 months (around 06/16/2015).

## 2014-12-15 ENCOUNTER — Ambulatory Visit (INDEPENDENT_AMBULATORY_CARE_PROVIDER_SITE_OTHER): Payer: Medicaid Other | Admitting: Pediatric Endocrinology

## 2014-12-15 ENCOUNTER — Encounter: Payer: Self-pay | Admitting: Pediatric Endocrinology

## 2014-12-15 VITALS — BP 119/77 | HR 94 | Wt 334.0 lb

## 2014-12-15 DIAGNOSIS — R7309 Other abnormal glucose: Secondary | ICD-10-CM

## 2014-12-15 DIAGNOSIS — E291 Testicular hypofunction: Secondary | ICD-10-CM

## 2014-12-15 DIAGNOSIS — E669 Obesity, unspecified: Secondary | ICD-10-CM | POA: Diagnosis not present

## 2014-12-15 DIAGNOSIS — I10 Essential (primary) hypertension: Secondary | ICD-10-CM | POA: Diagnosis not present

## 2014-12-15 DIAGNOSIS — F341 Dysthymic disorder: Secondary | ICD-10-CM

## 2014-12-15 DIAGNOSIS — N62 Hypertrophy of breast: Secondary | ICD-10-CM

## 2014-12-15 DIAGNOSIS — R7303 Prediabetes: Secondary | ICD-10-CM

## 2014-12-15 DIAGNOSIS — R32 Unspecified urinary incontinence: Secondary | ICD-10-CM

## 2014-12-15 LAB — POCT GLYCOSYLATED HEMOGLOBIN (HGB A1C): Hemoglobin A1C: 5.7

## 2014-12-15 LAB — COMPREHENSIVE METABOLIC PANEL
ALBUMIN: 4 g/dL (ref 3.5–5.2)
ALT: 24 U/L (ref 0–53)
AST: 20 U/L (ref 0–37)
Alkaline Phosphatase: 89 U/L (ref 39–117)
BUN: 19 mg/dL (ref 6–23)
CHLORIDE: 101 meq/L (ref 96–112)
CO2: 23 mEq/L (ref 19–32)
CREATININE: 0.86 mg/dL (ref 0.50–1.35)
Calcium: 9.3 mg/dL (ref 8.4–10.5)
GLUCOSE: 101 mg/dL — AB (ref 70–99)
POTASSIUM: 4.2 meq/L (ref 3.5–5.3)
Sodium: 142 mEq/L (ref 135–145)
Total Bilirubin: 0.4 mg/dL (ref 0.2–1.1)
Total Protein: 7.1 g/dL (ref 6.0–8.3)

## 2014-12-15 LAB — LIPID PANEL
Cholesterol: 222 mg/dL — ABNORMAL HIGH (ref 0–200)
HDL: 45 mg/dL (ref 31–65)
LDL Cholesterol: 146 mg/dL — ABNORMAL HIGH (ref 0–99)
Total CHOL/HDL Ratio: 4.9 Ratio
Triglycerides: 155 mg/dL — ABNORMAL HIGH (ref <150)
VLDL: 31 mg/dL (ref 0–40)

## 2014-12-15 LAB — GLUCOSE, POCT (MANUAL RESULT ENTRY): POC Glucose: 95 mg/dl (ref 70–99)

## 2014-12-15 LAB — FOLLICLE STIMULATING HORMONE: FSH: 19.4 m[IU]/mL — AB (ref 1.4–18.1)

## 2014-12-15 LAB — LUTEINIZING HORMONE: LH: 23.1 m[IU]/mL

## 2014-12-15 LAB — ESTRADIOL: Estradiol: 16.1 pg/mL

## 2014-12-15 MED ORDER — TESTOSTERONE CYPIONATE 200 MG/ML IM SOLN: 150.0000 mg | INTRAMUSCULAR | Status: DC

## 2014-12-15 MED ORDER — "SYRINGE/NEEDLE (DISP) 21G X 1"" 3 ML MISC": 1.0000 | Status: DC

## 2014-12-15 NOTE — Addendum Note (Signed)
Addended by: Sharolyn DouglasBADIK, Tiawanna Luchsinger R on: 12/15/2014 01:16 PM   Modules accepted: Orders

## 2014-12-15 NOTE — Progress Notes (Signed)
Subjective:  Subjective Patient Name: Allen Harding Date of Birth: 1995/08/18  MRN: [id number]  Allen Harding  presents to the office today for follow-up and management of his prediabetes, morbid obesity, hypertension, depression, dyspepsia, genetic chromosomal abnormality syndrome, precocity, gynecomastia, microphallus, hypogonadism, ADHD, tachycardia, goiter, behavior disorder, and hypercholesterolemia   HISTORY OF PRESENT ILLNESS:   Allen Harding is a 19 y.o. Caucasian male   Harding was accompanied by his mother, 2 nieces and a cousin.   1.  "Allen Harding" was first referred to our clinic on 09/10/06 by his his cardiologist, Dr. [name] of [facility], for evaluation and management of prediabetes and obesity. He was 30-1/20 years old. His medical history is significant for abnormal chromosomes and developmental delay with walking and speech development at about age 4 year. At age 19 he still had some gross motor motor and fine motor problems. The family had been concerned about his weight since the first grade.     2. The patient's last [facility] visit was on 09/28/14. Allen Harding has been generally healthy this his last visit. His grandmother has terminal lung cancer and he is having a very hard time with this. He is seeing a Quarry manager Allen Harding) about every 2 weeks. He is not on any medication for depression. He feels that he does a lot of stress eating. He does not have a lot of ways to work through his feelings/frustration/anger. He does not have a punching bag.   He is now taking Testosterone 0.7 cc (100mg /cc) every 2 weeks. He feels that the shots are going well. He does not like when it is injected slow but thinks it is ok fast. Since starting it he feels that he has more energy. He feels that he is getting stronger. He is sleeping well. He is getting more facial hair - he is unsure if he likes it or not.  His last injection was last week (Wednesday).   He continues on enalapril 20mg  for his BP. He has been taking it  every day.   He continues on Metformin 1000mg  BID for his type 2 diabetes. He reports good compliance. He is not having any stomach upset.  He has been walking about 5-10 minutes per day. This is enough to get his heart rate up and make him sweat.   He feels that he is doing better with his evening medication.   3. Pertinent Review of Systems:  Constitutional: The patient feels "tired". He is not having headaches Eyes: Vision seems to be good. There are no recognized eye problems. Wears glasses- eye exam in June 2016. Got new glasses Neck: The patient has no complaints of anterior neck swelling, soreness, tenderness, pressure, discomfort, or difficulty swallowing.   Heart: Heart rate increases with exercise or other physical activity. The patient has no complaints of palpitations, irregular heart beats, chest pain, or chest pressure.   Gastrointestinal: Bowel movents seem normal. The patient has no complaints of excessive hunger, acid reflux, upset stomach, stomach aches or pains, diarrhea, or constipation.  Legs: Muscle mass and strength seem normal. There are no complaints of numbness, tingling, burning, or pain. No edema is noted.  Feet: There are no obvious foot problems. There are no complaints of numbness, tingling, burning, or pain. No edema is noted. Neurologic: There are no recognized problems with muscle movement and strength, sensation, or coordination. GYN/GU: no nocturia if he takes his oxybutin- but otherwise still with primary enuresis. Had renal ultrasound and structurally normal  PAST MEDICAL, FAMILY, AND SOCIAL HISTORY  Past Medical History  Diagnosis Date  . Anxiety   . Diabetes mellitus   . 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 Obesity Mother   . Diabetes Mother   . Hypertension Mother   . Hyperlipidemia Mother   . Obesity Sister   . Obesity Maternal Grandmother   . Diabetes Maternal  Grandmother   . Cancer Maternal Grandmother   . Stroke Maternal Grandmother   . Obesity Maternal Grandfather      Current outpatient prescriptions:  .  cetirizine (ZYRTEC) 10 MG tablet, TAKE ONE TABLET BY MOUTH DAILY., Disp: 30 tablet, Rfl: 5 .  cholecalciferol (VITAMIN D) 400 UNITS TABS tablet, Take 5,000 Units by mouth daily., Disp: , Rfl:  .  enalapril (VASOTEC) 20 MG tablet, Take 20 mg by mouth., Disp: , Rfl:  .  ferrous sulfate 325 (65 FE) MG tablet, Take 1 tablet (325 mg total) by mouth daily with breakfast., Disp: 30 tablet, Rfl: 3 .  guanFACINE (INTUNIV) 2 MG TB24, Take 2 mg by mouth daily. , Disp: , Rfl:  .  letrozole (FEMARA) 2.5 MG tablet, Take 2.5 mg by mouth., Disp: , Rfl:  .  lisdexamfetamine (VYVANSE) 20 MG capsule, Take 70 mg by mouth every morning. , Disp: , Rfl:  .  metFORMIN (GLUCOPHAGE) 1000 MG tablet, TAKE (1) TABLET BY MOUTH TWICE DAILY WITH FOOD FOR DIABETES., Disp: 60 tablet, Rfl: 6 .  omeprazole (PRILOSEC) 40 MG capsule, Take 1 capsule (40 mg total) by mouth 2 (two) times daily., Disp: 60 capsule, Rfl: 6 .  oxybutynin (DITROPAN-XL) 10 MG 24 hr tablet, TAKE 1 TABLET BY MOUTH DAILY., Disp: 30 tablet, Rfl: 3 .  sertraline (ZOLOFT) 25 MG tablet, Take 100 mg by mouth daily. , Disp: , Rfl:  .  testosterone cypionate (DEPOTESTOTERONE CYPIONATE) 200 MG/ML injection, Inject 0.75 mLs (150 mg total) into the muscle every 14 (fourteen) days., Disp: 10 mL, Rfl: 5 .  traZODone (DESYREL) 100 MG tablet, Take 100 mg by mouth at bedtime., Disp: , Rfl:  .  chlorthalidone (HYGROTON) 25 MG tablet, Take 1 tablet (25 mg total) by mouth daily. (Patient not taking: Reported on 09/28/2014), Disp: 30 tablet, Rfl: 3 .  nystatin cream (MYCOSTATIN), Apply topically 2 (two) times daily. To groin/ belt area (Patient not taking: Reported on 08/25/2014), Disp: 30 g, Rfl: 1 .  [DISCONTINUED] oxybutynin (DITROPAN) 5 MG tablet, Take 10 mg by mouth Nightly. , Disp: , Rfl:   Allergies as of 12/15/2014  .  (No Known Allergies)     reports that he has never smoked. He has never used smokeless tobacco. He reports that he does not drink alcohol or use illicit drugs. Pediatric History  Patient Guardian Status  . Mother:  Allen Harding,Allen Harding   Other Topics Concern  . Not on file   Social History Narrative   Lives with mom, sister, 2 nieces, grandparents and sister's fiance.    12th [employer] - summer school to finish- graduating next week.   Primary Care Provider: Shaaron AdlerKavithashree Gnanasekar, MD  ROS: There are no other significant problems involving Suliman's other body systems.    Objective:  Objective Vital Signs:  BP 119/77 mmHg  Pulse 94  Wt 334 lb (151.501 kg)   Ht Readings from Last 3 Encounters:  09/13/14 5' 8.5" (1.74 m) (35 %*, Z = -0.38)  09/07/13 5\' 9"  (1.753 m) (44 %*, Z = -0.16)  08/19/13 5' 8.5" (1.74 m) (37 %*, Z = -0.33)   * Growth percentiles are based on CDC 2-20 Years data.   Wt Readings from Last 3 Encounters:  12/15/14 334 lb (151.501 kg) (100 %*, Z = 3.41)  12/14/14 335 lb (151.955 kg) (100 %*, Z = 3.42)  09/28/14 321 lb 6.4 oz (145.786 kg) (100 %*, Z = 3.29)   * Growth percentiles are based on CDC 2-20 Years data.   HC Readings from Last 3 Encounters:  No data found for [facility]   There is no height on file to calculate BSA. No height on file for this encounter. 100%ile (Z=3.41) based on CDC 2-20 Years weight-for-age data using vitals from 12/15/2014.    PHYSICAL EXAM:  Constitutional: The patient appears healthy and well nourished. The patient's height and weight are advanced for age.  Head: The head is normocephalic. Face: The face appears normal. There are no obvious dysmorphic features. Eyes: The eyes appear to be normally formed and spaced. Gaze is conjugate. There is no obvious arcus or proptosis. Moisture appears normal. Ears: The ears are normally placed and appear externally normal. Mouth: The oropharynx and tongue appear normal.  Dentition appears to be normal for age. Oral moisture is normal. Neck: The neck appears to be visibly normal. The thyroid gland is 18+ grams in size. The consistency of the thyroid gland is normal. The thyroid gland is not tender to palpation. Lungs: The lungs are clear to auscultation. Air movement is good. Heart: Heart rate and rhythm are regular. Heart sounds S1 and S2 are normal. I did not appreciate any pathologic cardiac murmurs. Abdomen: The abdomen appears to be morbidly in size for the patient's age. Bowel sounds are normal. Unable to assess hepatomegaly, splenomegaly, or other mass effect.  Arms: Muscle size and bulk are normal for age. Hands: There is no obvious tremor. Phalangeal and metacarpophalangeal joints are normal. Palmar muscles are normal for age. Palmar skin is normal. Palmar moisture is also normal. Legs: Muscles appear normal for age. No edema is present. Feet: Feet are normally formed. Dorsalis pedal pulses are normal. Neurologic: Strength is normal for age in both the upper and lower extremities. Muscle tone is normal. Sensation to touch is normal in both the legs and feet.   GYN/GU: +2 gynecomastia  LAB DATA:   Results for orders placed or performed in visit on 12/15/14 (from the past 672 hour(s))  POCT Glucose (CBG)   Collection Time: 12/15/14  8:37 AM  Result Value Ref Range   POC Glucose 95 70 - 99 mg/dl  POCT HgB R6E   Collection Time: 12/15/14  8:45 AM  Result Value Ref Range   Hemoglobin A1C 5.7         Assessment and Plan:  Assessment    ASSESSMENT:  1. Morbid obesity- weight continues to fluctuate. Currently doing a lot of stress eating  2. Hypertension- BP was good today but was elevated at PCP yesterday but mom says that he took his medication late yesterday morning.  3. Prediabetes- continues to be prediabetic. Taking metformin usually only once a day.   4. Gynecomastia- consistent with continued morbid obesity. 5. Hypogonadism- Now on  testosterone every other week 150 mg.  6. Elevated triglycerides- much higher than last check- but labs not done fasting.   PLAN:  1. Diagnostic: Puberty labs, lipids, and cmp today.  Repeat testosterone labs in 2 months  2. Therapeutic: Continue enalapril to 20 mg daily, continue chlorthalidone 25 mg daily  for BP. Continue metformin and testosterone.  3. Patient education: Reviewed growth data. Discussed challenges and goals. Discussed increasing his walking goal from 10 minutes to 15 minutes. Discussed "venting" his emotions in a controlled environment at least twice a week through PHYSICAL exertion (yelling, screaming, punching). Discussed alternatives to stress eating. Allen Harding asked many appropriate questions and seemed satisfied with visit today. Mom reports issues with rx for testosterone- only getting 1 ml from pharmacy- called pharmacy to discuss- he should be getting 2 ml/month. They will provide second vial for mom. 4. Follow-up: Return in about 3 months (around 03/17/2015).      Cammie Sickle, MD  Level of Service: This visit lasted in excess of 25 minutes. More than 50% of the visit was devoted to counseling.

## 2014-12-15 NOTE — Patient Instructions (Signed)
Continue Enalpril and Metformin  Increase walking to a goal of 15 minutes every day.  Aim for 2 "venting episodes" per week- controlled environment- safe space- no harm to anyone else.

## 2014-12-16 LAB — TESTOSTERONE, FREE, TOTAL, SHBG
Sex Hormone Binding: 13 nmol/L (ref 10–50)
TESTOSTERONE-% FREE: 3.1 % — AB (ref 1.6–2.9)
TESTOSTERONE: 474 ng/dL (ref 300–890)
Testosterone, Free: 147.9 pg/mL (ref 47.0–244.0)

## 2014-12-29 ENCOUNTER — Encounter: Payer: Self-pay | Admitting: *Deleted

## 2015-03-20 ENCOUNTER — Encounter: Payer: Self-pay | Admitting: Pediatric Endocrinology

## 2015-03-20 ENCOUNTER — Ambulatory Visit (INDEPENDENT_AMBULATORY_CARE_PROVIDER_SITE_OTHER): Payer: Medicaid Other | Admitting: Pediatric Endocrinology

## 2015-03-20 VITALS — BP 145/87 | HR 131 | Wt 341.0 lb

## 2015-03-20 DIAGNOSIS — N62 Hypertrophy of breast: Secondary | ICD-10-CM

## 2015-03-20 DIAGNOSIS — I10 Essential (primary) hypertension: Secondary | ICD-10-CM | POA: Diagnosis not present

## 2015-03-20 DIAGNOSIS — R7303 Prediabetes: Secondary | ICD-10-CM | POA: Diagnosis not present

## 2015-03-20 DIAGNOSIS — E291 Testicular hypofunction: Secondary | ICD-10-CM | POA: Diagnosis not present

## 2015-03-20 LAB — POCT GLYCOSYLATED HEMOGLOBIN (HGB A1C): HEMOGLOBIN A1C: 128

## 2015-03-20 LAB — GLUCOSE, POCT (MANUAL RESULT ENTRY): POC Glucose: 5.6 mg/dl — AB (ref 70–99)

## 2015-03-20 NOTE — Progress Notes (Signed)
Subjective:  Subjective Patient Name: Allen Harding Kawamoto Date of Birth: 1996-01-08  MRN: [id number]  Allen Harding Tappen  presents to the office today for follow-up and management of his prediabetes, morbid obesity, hypertension, depression, dyspepsia, genetic chromosomal abnormality syndrome, precocity, gynecomastia, microphallus, hypogonadism, ADHD, tachycardia, goiter, behavior disorder, and hypercholesterolemia   HISTORY OF PRESENT ILLNESS:   Allen Harding is a 19 y.o. Caucasian male   Allen Harding was accompanied by his mother   1.  "Allen Harding" was first referred to our clinic on 09/10/06 by his his cardiologist, [facility], for evaluation and management of prediabetes and obesity. He was 1411-1/19 years old. His medical history is significant for abnormal chromosomes and developmental delay with walking and speech development at about age 74 year. At age 19 he still had some gross motor motor and fine motor problems. The family had been concerned about his weight since the first grade.     2. The patient's last [facility] visit was on 12/15/14. Allen Harding has been generally healthy this his last visit.   His grandmother has terminal lung cancer and he is having a very hard time with this. He is seeing a [employer] manag[employer]ouncilor Jasmine December(Sharon) about every 2 weeks. He has started zoloft for his depression and feels that it is helping. They are having a lot of continued stress as grandmother has become very irritable and yells at everyone. We discussed strategies for stress management at last visit- but he has not really implemented them. He has been walking some. His friend says that he will help him to work out with [employer] etc- but they have not started yet.   He is now taking Testosterone 0.7 cc (100mg /cc) every 2 weeks. He feels that the shots are going well. He does not like when it is injected slow but thinks it is ok fast. Since starting it he feels that he has more energy. He feels that he is getting stronger. He is sleeping well. He is  getting more facial hair - he is unsure if he likes it or not.  His last injection was last week (Wednesday).   He continues on enalapril 20mg  for his BP. He has been taking it every day. He is also taking Chlorthalidone for his BP per Cardiology.   He continues on Metformin 1000mg  BID for his type 2 diabetes. He reports good compliance. He is not having any stomach upset. He had one episode of syncope that mom thinks was associated with hypoglycemia as he had taken all his medication that day but had not eaten anything all day. They checked his sugar after drinking OJ and it was 111.    He forgets to take his medication unless mom is right on top of him to get him to take it. He missed his doses on Saturday.    3. Pertinent Review of Systems:  Constitutional: The patient feels "tired". He is not having headaches Eyes: Vision seems to be good. There are no recognized eye problems. Wears glasses- eye exam in June 2016. Got new glasses Neck: The patient has no complaints of anterior neck swelling, soreness, tenderness, pressure, discomfort, or difficulty swallowing.   Heart: Heart rate increases with exercise or other physical activity. The patient has no complaints of palpitations, irregular heart beats, chest pain, or chest pressure.   Gastrointestinal: Bowel movents seem normal. The patient has no complaints of excessive hunger, acid reflux, upset stomach, stomach aches or pains, diarrhea, or constipation.  Legs: Muscle mass and strength seem normal. There are  no complaints of numbness, tingling, burning, or pain. No edema is noted.  Feet: There are no obvious foot problems. There are no complaints of numbness, tingling, burning, or pain. No edema is noted. Neurologic: There are no recognized problems with muscle movement and strength, sensation, or coordination. GYN/GU: no nocturia if he takes his oxybutin- but otherwise still with primary enuresis. Had renal ultrasound and structurally normal    PAST MEDICAL, FAMILY, AND SOCIAL HISTORY  Past Medical History  Diagnosis Date  . Anxiety   . Diabetes mellitus   . 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 Obesity Mother   . Diabetes Mother   . Hypertension Mother   . Hyperlipidemia Mother   . Obesity Sister   . Obesity Maternal Grandmother   . Diabetes Maternal Grandmother   . Cancer Maternal Grandmother   . Stroke Maternal Grandmother   . Obesity Maternal Grandfather      Current outpatient prescriptions:  .  cetirizine (ZYRTEC) 10 MG tablet, TAKE ONE TABLET BY MOUTH DAILY., Disp: 30 tablet, Rfl: 5 .  chlorthalidone (HYGROTON) 25 MG tablet, Take 1 tablet (25 mg total) by mouth daily. (Patient not taking: Reported on 09/28/2014), Disp: 30 tablet, Rfl: 3 .  cholecalciferol (VITAMIN D) 400 UNITS TABS tablet, Take 5,000 Units by mouth daily., Disp: , Rfl:  .  enalapril (VASOTEC) 20 MG tablet, Take 20 mg by mouth., Disp: , Rfl:  .  ferrous sulfate 325 (65 FE) MG tablet, Take 1 tablet (325 mg total) by mouth daily with breakfast., Disp: 30 tablet, Rfl: 3 .  guanFACINE (INTUNIV) 2 MG TB24, Take 2 mg by mouth daily. , Disp: , Rfl:  .  letrozole (FEMARA) 2.5 MG tablet, Take 2.5 mg by mouth., Disp: , Rfl:  .  lisdexamfetamine (VYVANSE) 20 MG capsule, Take 70 mg by mouth every morning. , Disp: , Rfl:  .  metFORMIN (GLUCOPHAGE) 1000 MG tablet, TAKE (1) TABLET BY MOUTH TWICE DAILY WITH FOOD FOR DIABETES., Disp: 60 tablet, Rfl: 6 .  nystatin cream (MYCOSTATIN), Apply topically 2 (two) times daily. To groin/ belt area (Patient not taking: Reported on 08/25/2014), Disp: 30 g, Rfl: 1 .  omeprazole (PRILOSEC) 40 MG capsule, Take 1 capsule (40 mg total) by mouth 2 (two) times daily., Disp: 60 capsule, Rfl: 6 .  oxybutynin (DITROPAN-XL) 10 MG 24 hr tablet, TAKE 1 TABLET BY MOUTH DAILY., Disp: 30 tablet, Rfl: 3 .  sertraline (ZOLOFT) 25 MG tablet, Take 100 mg by mouth daily. ,  Disp: , Rfl:  .  SYRINGE-NEEDLE, DISP, 3 ML (BD ECLIPSE SYRINGE) 21G X 1" 3 ML MISC, 1 each by Does not apply route as directed., Disp: 50 each, Rfl: 1 .  testosterone cypionate (DEPOTESTOSTERONE CYPIONATE) 200 MG/ML injection, Inject 0.75 mLs (150 mg total) into the muscle every 14 (fourteen) days., Disp: 2 mL, Rfl: 5 .  traZODone (DESYREL) 100 MG tablet, Take 100 mg by mouth at bedtime., Disp: , Rfl:  .  [DISCONTINUED] oxybutynin (DITROPAN) 5 MG tablet, Take 10 mg by mouth Nightly. , Disp: , Rfl:   Allergies as of 03/20/2015  . (No Known Allergies)     reports that he has never smoked. He has never used smokeless tobacco. He reports that he does not drink alcohol or use illicit drugs. Pediatric History  Patient Guardian Status  . Mother:  Eiden, Bagot   Other Topics Concern  . Not on file  Social History Narrative   Lives with mom, sister, 2 nieces, grandparents and sister's fiance.    Graduated HS. Has been accepted for community college. 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    Objective:  Objective Vital Signs:  BP 145/87 mmHg  Pulse 131  Wt 341 lb (154.677 kg)   Ht Readings from Last 3 Encounters:  09/13/14 5' 8.5" (1.74 m) (35 %*, Z = -0.38)  09/07/13  (1.753 m) (44 %*, Z = -0.16)  08/19/13 5' 8.5" (1.74 m) (37 %*, Z = -0.33)   * Growth percentiles are based on CDC 2-20 Years data.   Wt Readings from Last 3 Encounters:  03/20/15 341 lb (154.677 kg) (100 %*, Z = 3.47)  12/15/14 334 lb (151.501 kg) (100 %*, Z = 3.41)  12/14/14 335 lb (151.955 kg) (100 %*, Z = 3.42)   * Growth percentiles are based on CDC 2-20 Years data.   HC Readings from Last 3 Encounters:  No data found for [facility]   There is no height on file to calculate BSA. No height on file for this encounter. 100%ile (Z=3.47) based on CDC 2-20 Years  weight-for-age data using vitals from 03/20/2015.    PHYSICAL EXAM:  Constitutional: The patient appears healthy and well nourished. The patient's height and weight are advanced for age.  Head: The head is normocephalic. Face: The face appears normal. There are no obvious dysmorphic features. Eyes: The eyes appear to be normally formed and spaced. Gaze is conjugate. There is no obvious arcus or proptosis. Moisture appears normal. Ears: The ears are normally placed and appear externally normal. Mouth: The oropharynx and tongue appear normal. Dentition appears to be normal for age. Oral moisture is normal. Neck: The neck appears to be visibly normal. The thyroid gland is 18+ grams in size. The consistency of the thyroid gland is normal. The thyroid gland is not tender to palpation. Lungs: The lungs are clear to auscultation. Air movement is good. Heart: Heart rate and rhythm are regular. Heart sounds S1 and S2 are normal. I did not appreciate any pathologic cardiac murmurs. Abdomen: The abdomen appears to be morbidly in size for the patient's age. Bowel sounds are normal. Unable to assess hepatomegaly, splenomegaly, or other mass effect.  Arms: Muscle size and bulk are normal for age. Hands: There is no obvious tremor. Phalangeal and metacarpophalangeal joints are normal. Palmar muscles are normal for age. Palmar skin is normal. Palmar moisture is also normal. Legs: Muscles appear normal for age. No edema is present. Feet: Feet are normally formed. Dorsalis pedal pulses are normal. Neurologic: Strength is normal for age in both the upper and lower extremities. Muscle tone is normal. Sensation to touch is normal in both the legs and feet.   GYN/GU: +2 gynecomastia  LAB DATA:   Results for orders placed or performed in visit on 03/20/15 (from the past 672 hour(s))  POCT HgB A1C   Collection Time: 03/20/15  8:58 AM  Result Value Ref Range   Hemoglobin A1C 128   POCT Glucose (CBG)    Collection Time: 03/20/15  9:08 AM  Result Value Ref Range   POC Glucose 5.6 (A) 70 - 99 mg/dl        Assessment and Plan:  Assessment    ASSESSMENT:  1. Morbid obesity- weight continues to increase. Currently doing a lot of stress eating  2. Hypertension- BP continues to be elevated. He admits to missing his medication doses every few days 3. Prediabetes- continues to be prediabetic. Taking metformin usually only once a day.  Feels he is doing better with evening dose- but also admits that he sometimes misses all his meds.  4. Gynecomastia- consistent with continued morbid obesity. 5. Hypogonadism- Now on testosterone every other week 150 mg.  6. Elevated triglycerides- much higher than last check- but labs not done fasting.   PLAN:  1. Diagnostic:A1C as above 2. Therapeutic: Continue enalapril to 20 mg daily, continue chlorthalidone 25 mg daily for BP. Continue metformin and testosterone. May need another anti hypertensive agent but I suspect that part of his continued hypertension is medication non-compliance as well as continued weight gain.  3. Patient education: Reviewed growth data. Discussed challenges and goals. Discussed his walking from 10 minutes to 15 minutes every day. Discussed that they have many plans for him to be more active but they have a hard time with follow through. Continued high stress at home- but now on Zoloft.  Allen Duffel asked many appropriate questions and seemed satisfied with visit today. 4. Follow-up: Return in about 1 month (around 04/20/2015).      Cammie Sickle, MD  Level of Service: This visit lasted in excess of 25 minutes. More than 50% of the visit was devoted to counseling.

## 2015-03-20 NOTE — Patient Instructions (Signed)
Goals for next visit:  Lift weights 3 days per week.     Walk 10 minutes 2 days per week.    Don't miss any medication doses

## 2015-04-20 ENCOUNTER — Other Ambulatory Visit: Payer: Self-pay | Admitting: Pediatrics

## 2015-04-20 DIAGNOSIS — I10 Essential (primary) hypertension: Secondary | ICD-10-CM

## 2015-04-20 MED ORDER — ENALAPRIL MALEATE 20 MG PO TABS: 20.0000 mg | ORAL_TABLET | Freq: Every day | ORAL | Status: DC

## 2015-04-20 MED ORDER — LETROZOLE 2.5 MG PO TABS: 2.5000 mg | ORAL_TABLET | Freq: Every day | ORAL | Status: DC

## 2015-04-20 MED ORDER — CHLORTHALIDONE 25 MG PO TABS: 25.0000 mg | ORAL_TABLET | Freq: Every day | ORAL | Status: DC

## 2015-05-02 ENCOUNTER — Ambulatory Visit (INDEPENDENT_AMBULATORY_CARE_PROVIDER_SITE_OTHER): Payer: Medicaid Other | Admitting: Pediatric Endocrinology

## 2015-05-02 ENCOUNTER — Encounter: Payer: Self-pay | Admitting: Pediatric Endocrinology

## 2015-05-02 VITALS — BP 148/97 | HR 133 | Wt 332.0 lb

## 2015-05-02 DIAGNOSIS — Z23 Encounter for immunization: Secondary | ICD-10-CM | POA: Diagnosis not present

## 2015-05-02 DIAGNOSIS — E119 Type 2 diabetes mellitus without complications: Secondary | ICD-10-CM | POA: Diagnosis not present

## 2015-05-02 LAB — POCT GLYCOSYLATED HEMOGLOBIN (HGB A1C): Hemoglobin A1C: 5.7

## 2015-05-02 LAB — GLUCOSE, POCT (MANUAL RESULT ENTRY): POC Glucose: 98 mg/dl (ref 70–99)

## 2015-05-02 NOTE — Patient Instructions (Signed)
Goals: walk 10-15 minutes 3 days a week  Eat a vegetable (not a starch) at least 3 days a week.   No change to medication doses.   Follow up with cardiology for blood pressure.

## 2015-05-02 NOTE — Progress Notes (Signed)
Subjective:  Subjective Patient Name: Allen Harding Date of Birth: 05-May-1996  MRN: [id number]  Allen Harding  presents to the office today for follow-up and management of his prediabetes, morbid obesity, hypertension, depression, dyspepsia, genetic chromosomal abnormality syndrome, precocity, gynecomastia, microphallus, hypogonadism, ADHD, tachycardia, goiter, behavior disorder, and hypercholesterolemia   HISTORY OF PRESENT ILLNESS:   Allen Harding is a 19 y.o. Caucasian male   Allen Harding was accompanied by his mother   1.  "Allen Harding" was first referred to our clinic on 09/10/06 by his his cardiologist, [facility], for evaluation and management of prediabetes and obesity. He was 5411-1/20 years old. His medical history is significant for abnormal chromosomes and developmental delay with walking and speech development at about age 69 year. At age 19 he still had some gross motor motor and fine motor problems. The family had been concerned about his weight since the first grade.     2. The patient's last [facility] visit was on 03/20/15. Allen Harding has been generally healthy since his last visit.  He saw Dr. Ace GinsBuck last week. He has lost some weight. He is still struggling with taking his medications. Mom says that he will only take them if she is standing right there. She cannot call him to remind him to take his medication because he does not follow through. He says that once he did take it when she called.   His grandmother has terminal lung cancer and he is having a very hard time with this. He is seeing a [employer] managercouncilor Allen Harding(Allen Harding) about every 2 weeks. He continued on zoloft for his depression and feels that it is helping. They are having a lot of continued stress as grandmother has become very irritable and yells at everyone. He feels that this is worse.  He continues on enalapril 20mg  for his BP. He has been taking it every day. He is also taking Chlorthalidone 50 mg for his BP per Cardiology. (increased last week by  Cardiology).  He continues on Metformin 1000mg  BID for his type 2 diabetes. He reports good compliance. He is not having any stomach upset.  He continues on Testosterone 0.317ml every 2 weeks.   He has been eating smaller portions and skipping some meals. He mostly drinks water except with his medication which he feels tastes bad- those he takes with [employer].    3. Pertinent Review of Systems:  Constitutional: The patient feels "kinda tired/neutral". He is not having headaches Eyes: Vision seems to be good. There are no recognized eye problems. Wears glasses- eye exam in June 2016. Got new glasses Neck: The patient has no complaints of anterior neck swelling, soreness, tenderness, pressure, discomfort, or difficulty swallowing.   Heart: Heart rate increases with exercise or other physical activity. The patient has no complaints of palpitations, irregular heart beats, chest pain, or chest pressure.   Gastrointestinal: Bowel movents seem normal. The patient has no complaints of excessive hunger, acid reflux, upset stomach, stomach aches or pains, diarrhea, or constipation.  Legs: Muscle mass and strength seem normal. There are no complaints of numbness, tingling, burning, or pain. No edema is noted.  Feet: There are no obvious foot problems. There are no complaints of numbness, tingling, burning, or pain. No edema is noted. Neurologic: There are no recognized problems with muscle movement and strength, sensation, or coordination. GYN/GU: no nocturia if he takes his oxybutin- but otherwise still with primary enuresis. Had renal ultrasound and structurally normal   PAST MEDICAL, FAMILY, AND SOCIAL HISTORY  Past  Medical History  Diagnosis Date  . Anxiety   . Diabetes mellitus   . 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 Obesity Mother   . Diabetes Mother   . Hypertension Mother   . Hyperlipidemia Mother   . Obesity Sister    . Obesity Maternal Grandmother   . Diabetes Maternal Grandmother   . Cancer Maternal Grandmother   . Stroke Maternal Grandmother   . Obesity Maternal Grandfather      Current outpatient prescriptions:  .  cetirizine (ZYRTEC) 10 MG tablet, TAKE ONE TABLET BY MOUTH DAILY., Disp: 30 tablet, Rfl: 5 .  chlorthalidone (HYGROTON) 25 MG tablet, Take 1 tablet (25 mg total) by mouth daily. (Patient taking differently: Take 50 mg by mouth daily. ), Disp: 30 tablet, Rfl: 3 .  cholecalciferol (VITAMIN D) 400 UNITS TABS tablet, Take 5,000 Units by mouth daily., Disp: , Rfl:  .  enalapril (VASOTEC) 20 MG tablet, Take 1 tablet (20 mg total) by mouth daily., Disp: 30 tablet, Rfl: 3 .  ferrous sulfate 325 (65 FE) MG tablet, Take 1 tablet (325 mg total) by mouth daily with breakfast., Disp: 30 tablet, Rfl: 3 .  guanFACINE (INTUNIV) 2 MG TB24, Take 2 mg by mouth daily. , Disp: , Rfl:  .  letrozole (FEMARA) 2.5 MG tablet, Take 1 tablet (2.5 mg total) by mouth daily., Disp: 30 tablet, Rfl: 3 .  lisdexamfetamine (VYVANSE) 20 MG capsule, Take 70 mg by mouth every morning. , Disp: , Rfl:  .  metFORMIN (GLUCOPHAGE) 1000 MG tablet, TAKE (1) TABLET BY MOUTH TWICE DAILY WITH FOOD FOR DIABETES., Disp: 60 tablet, Rfl: 6 .  omeprazole (PRILOSEC) 40 MG capsule, Take 1 capsule (40 mg total) by mouth 2 (two) times daily., Disp: 60 capsule, Rfl: 6 .  oxybutynin (DITROPAN-XL) 10 MG 24 hr tablet, TAKE 1 TABLET BY MOUTH DAILY., Disp: 30 tablet, Rfl: 3 .  sertraline (ZOLOFT) 25 MG tablet, Take 100 mg by mouth daily. , Disp: , Rfl:  .  SYRINGE-NEEDLE, DISP, 3 ML (BD ECLIPSE SYRINGE) 21G X 1" 3 ML MISC, 1 each by Does not apply route as directed., Disp: 50 each, Rfl: 1 .  testosterone cypionate (DEPOTESTOSTERONE CYPIONATE) 200 MG/ML injection, Inject 0.75 mLs (150 mg total) into the muscle every 14 (fourteen) days., Disp: 2 mL, Rfl: 5 .  traZODone (DESYREL) 100 MG tablet, Take 100 mg by mouth at bedtime., Disp: , Rfl:  .   [DISCONTINUED] oxybutynin (DITROPAN) 5 MG tablet, Take 10 mg by mouth Nightly. , Disp: , Rfl:   Allergies as of 05/02/2015  . (No Known Allergies)     reports that he has never smoked. He has never used smokeless tobacco. He reports that he does not drink alcohol or use illicit drugs. Pediatric History  Patient Guardian Status  . Mother:  Larwence, Tu   Other Topics Concern  . Not on file   Social History Narrative   Lives with mom, sister, 2 nieces, grandparents and sister's fiance.    Graduated HS. Has been accepted for community college. 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   Primary Care Provider: Shaaron Adler, MD  ROS: There are no other significant problems involving Staley's other body systems.    Objective:  Objective Vital Signs:  BP 148/97 mmHg  Pulse 133  Wt 332 lb (150.594 kg)   Ht Readings from Last 3 Encounters:  09/13/14 5' 8.5" (1.74 m) (35 %*, Z = -0.38)  09/07/13 5\' 9"  (1.753 m) (44 %*, Z = -0.16)  08/19/13 5' 8.5" (1.74 m) (37 %*, Z = -0.33)   * Growth percentiles are based on CDC 2-20 Years data.   Wt Readings from Last 3 Encounters:  05/02/15 332 lb (150.594 kg)  03/20/15 341 lb (154.677 kg) (100 %*, Z = 3.47)  12/15/14 334 lb (151.501 kg) (100 %*, Z = 3.41)   * Growth percentiles are based on CDC 2-20 Years data.   HC Readings from Last 3 Encounters:  No data found for [facility]   There is no height on file to calculate BSA. Normalized stature-for-age data available only for age 81 to 20 years. Normalized weight-for-age data available only for age 81 to 20 years.    PHYSICAL EXAM:  Constitutional: The patient appears healthy and well nourished. The patient's height and weight are advanced for age.  Head: The head is normocephalic. Face: The face appears normal. There are no obvious dysmorphic features. Eyes: The eyes appear to be normally formed and spaced. Gaze is conjugate. There is no obvious arcus or  proptosis. Moisture appears normal. Ears: The ears are normally placed and appear externally normal. Mouth: The oropharynx and tongue appear normal. Dentition appears to be normal for age. Oral moisture is normal. Neck: The neck appears to be visibly normal. The thyroid gland is 18+ grams in size. The consistency of the thyroid gland is normal. The thyroid gland is not tender to palpation. Lungs: The lungs are clear to auscultation. Air movement is good. Heart: Heart rate and rhythm are regular. Heart sounds S1 and S2 are normal. I did not appreciate any pathologic cardiac murmurs. Abdomen: The abdomen appears to be morbidly in size for the patient's age. Bowel sounds are normal. Unable to assess hepatomegaly, splenomegaly, or other mass effect.  Arms: Muscle size and bulk are normal for age. Hands: There is no obvious tremor. Phalangeal and metacarpophalangeal joints are normal. Palmar muscles are normal for age. Palmar skin is normal. Palmar moisture is also normal. Legs: Muscles appear normal for age. No edema is present. Feet: Feet are normally formed. Dorsalis pedal pulses are normal. Neurologic: Strength is normal for age in both the upper and lower extremities. Muscle tone is normal. Sensation to touch is normal in both the legs and feet.   GYN/GU: +2 gynecomastia  LAB DATA:   Results for orders placed or performed in visit on 05/02/15 (from the past 672 hour(s))  POCT Glucose (CBG)   Collection Time: 05/02/15  9:55 AM  Result Value Ref Range   POC Glucose 98 70 - 99 mg/dl  POCT HgB Z6X   Collection Time: 05/02/15 10:07 AM  Result Value Ref Range   Hemoglobin A1C 5.7         Assessment and Plan:  Assessment    ASSESSMENT:  1. Morbid obesity- weight has decreased since last visit.  2. Hypertension- BP continues to be elevated. Saw Dr. [name] last week and meds adjusted. Still sometimes forgetting medication 3. Prediabetes- continues to be prediabetic. Taking metformin usually  only once a day.  Feels he is doing better with evening dose- but also admits that he sometimes misses all his meds.  4. Gynecomastia- consistent with continued morbid obesity. 5. Hypogonadism- Now on testosterone every other week 150 mg.  6. Elevated triglycerides- continue to be elevated- will repeat spring 2017  PLAN:  1. Diagnostic:A1C as above 2. Therapeutic: Continue enalapril  to 20 mg daily, continue chlorthalidone 50 mg daily for BP. Continue metformin and testosterone. 3. Patient education: Reviewed growth data. Discussed challenges and goals. Discussed his walking from 10 minutes to 15 minutes 3 days a week. Discussed eating a vegetable (not fried) 3 days a week.  Continued high stress at home- but now on Zoloft.  Allen Harding asked many appropriate questions and seemed satisfied with visit today. Flu shot today.  4. Follow-up: Return in about 3 months (around 08/01/2015).      Cammie Sickle, MD Level of Service: This visit lasted in excess of 25 minutes. More than 50% of the visit was devoted to counseling.

## 2015-05-25 ENCOUNTER — Other Ambulatory Visit: Payer: Self-pay | Admitting: Pediatrics

## 2015-05-25 DIAGNOSIS — E291 Testicular hypofunction: Secondary | ICD-10-CM

## 2015-05-25 MED ORDER — TESTOSTERONE CYPIONATE 200 MG/ML IM SOLN: 150.0000 mg | INTRAMUSCULAR | Status: DC

## 2015-05-26 ENCOUNTER — Other Ambulatory Visit: Payer: Self-pay | Admitting: *Deleted

## 2015-06-16 ENCOUNTER — Encounter: Payer: Self-pay | Admitting: Pediatrics

## 2015-06-16 ENCOUNTER — Ambulatory Visit (INDEPENDENT_AMBULATORY_CARE_PROVIDER_SITE_OTHER): Payer: Medicaid Other | Admitting: Pediatrics

## 2015-06-16 VITALS — BP 150/97 | HR 103 | Wt 337.0 lb

## 2015-06-16 DIAGNOSIS — I10 Essential (primary) hypertension: Secondary | ICD-10-CM

## 2015-06-16 DIAGNOSIS — F341 Dysthymic disorder: Secondary | ICD-10-CM | POA: Diagnosis not present

## 2015-06-16 DIAGNOSIS — Z6379 Other stressful life events affecting family and household: Secondary | ICD-10-CM | POA: Diagnosis not present

## 2015-06-16 DIAGNOSIS — Z68.41 Body mass index (BMI) pediatric, greater than or equal to 95th percentile for age: Secondary | ICD-10-CM

## 2015-06-16 NOTE — Progress Notes (Signed)
Chief Complaint  Patient presents with  . Follow-up    HPI Allen Harding here for follow- up appt. He is followed by cardiology for essential HTN, not well controlled at his last visit and his medications were increased Mother and he have just joined the [employer] this year- She is encouraging him to exercise. She tries to avoid having junk food in the house but states her parents live in the same house and will buy things like doughnuts and higher fat milk. Allen Harding is resistant to activity. 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 Class meets 1 day a week and he states the rest of the week is his to relax.- his "days off".  He continues in counseling [facility]. He sees his counselor twice a month and psych once a month. He does not report the level of depression he had over the summer. He and mom still say they are stress eaters. The primary stress discussed today is MGM- dx'd with cancer years ago. Mom states [facility] and use the excuse that she has a death sentence. The grandparents have recently started marriage counseling. The family hears them fight frequently   History was provided by the mother. patient.  ROS:     Constitutional  Afebrile, normal appetite, normal activity.   Opthalmologic  no irritation or drainage.   ENT  no rhinorrhea or congestion , no sore throat, no ear pain. Cardiovascular  No chest pain Respiratory  no cough , wheeze or chest pain.  Gastointestinal  no abdominal pain, nausea or vomiting, bowel movements normal.   Genitourinary  Voiding normally  Musculoskeletal  no complaints of pain, no injuries.   Dermatologic  no rashes or lesions Neurologic - no significant history of headaches, no weakness  family history includes Cancer in his maternal grandmother; Diabetes in his maternal grandmother and mother; Hyperlipidemia in his mother; Hypertension in his mother; Obesity in his maternal grandfather, maternal grandmother,  mother, and sister; Stroke in his maternal grandmother.   BP 150/97 mmHg  Pulse 103  Wt 337 lb (152.862 kg)    Objective:         General alert in NAD  Derm   no rashes or lesions  Head Normocephalic, atraumatic                    Eyes Normal, no discharge  Ears:   TMs normal bilaterally  Nose:   patent normal mucosa, turbinates normal, no rhinorhea  Oral cavity  moist mucous membranes, no lesions  Throat:   normal tonsils, without exudate or erythema  Neck supple FROM  Lymph:   no significant cervical adenopathy  Lungs:  clear with equal breath sounds bilaterally  Heart:   regular rate and rhythm, no murmur  Abdomen:  deferred  GU:  deferred  back No deformity  Extremities:   no deformity  Neuro:  intact no focal defects        Assessment/plan    1. BMI (body mass index), pediatric, > 99% for age Weight has been relatively stable over the past 78mo. Unclear how motivated Allen Harding is to make changes in lifestyle Diet discussed and handout given. Is at risk for diabetes, mother is prediabetic. HgbA1c done 71mo ago- 5.6  2. Essential hypertension, benign Managed by cardiology, continues with high readings although mother felt todays measure 150/97 was ok  3. Dysthymia Seems better than 78mo ago, is coping better  with MGM illness, has more issues due to conflict with MGM  4. Stress due to illness of family member See above and HPI, per mom's description MGM creates conflict in response to her diagnosis Discussed Hospice might be an option for family support  As pt is aging out of pediatrics, discussed transition to adult PCP, mother will check if her doctor will accept hime asn Also her endocrinologist for his hypogonadism  I spent 30 minutes of face-to-face time with the patient and his mother, more than half of it in consultation.    Follow up  Return will see in 57mo if not transitioned to adult med.

## 2015-06-16 NOTE — Patient Instructions (Signed)
[facility], consider hospice for support Will need to transition to adult med, can continue here until MD arranged

## 2015-06-21 ENCOUNTER — Other Ambulatory Visit: Payer: Self-pay | Admitting: Pediatrics

## 2015-06-24 ENCOUNTER — Emergency Department (HOSPITAL_COMMUNITY)
Admission: EM | Admit: 2015-06-24 | Discharge: 2015-06-24 | Disposition: A | Discharge status: Home / Self Care | Payer: Medicaid Other | Attending: Emergency Medicine | Admitting: Emergency Medicine

## 2015-06-24 ENCOUNTER — Encounter (HOSPITAL_COMMUNITY): Payer: Self-pay | Admitting: Emergency Medicine

## 2015-06-24 DIAGNOSIS — H6123 Impacted cerumen, bilateral: Secondary | ICD-10-CM | POA: Diagnosis not present

## 2015-06-24 DIAGNOSIS — E119 Type 2 diabetes mellitus without complications: Secondary | ICD-10-CM | POA: Insufficient documentation

## 2015-06-24 DIAGNOSIS — I10 Essential (primary) hypertension: Secondary | ICD-10-CM | POA: Diagnosis not present

## 2015-06-24 DIAGNOSIS — J029 Acute pharyngitis, unspecified: Secondary | ICD-10-CM | POA: Diagnosis not present

## 2015-06-24 DIAGNOSIS — Z7984 Long term (current) use of oral hypoglycemic drugs: Secondary | ICD-10-CM | POA: Diagnosis not present

## 2015-06-24 DIAGNOSIS — H938X2 Other specified disorders of left ear: Secondary | ICD-10-CM | POA: Diagnosis present

## 2015-06-24 DIAGNOSIS — F419 Anxiety disorder, unspecified: Secondary | ICD-10-CM | POA: Diagnosis not present

## 2015-06-24 DIAGNOSIS — J3489 Other specified disorders of nose and nasal sinuses: Secondary | ICD-10-CM | POA: Diagnosis not present

## 2015-06-24 DIAGNOSIS — Z79899 Other long term (current) drug therapy: Secondary | ICD-10-CM | POA: Insufficient documentation

## 2015-06-24 HISTORY — DX: Essential (primary) hypertension: I10

## 2015-06-24 MED ORDER — HYDROGEN PEROXIDE 3 % EX SOLN
CUTANEOUS | Status: AC
  Filled 2015-06-24: qty 473

## 2015-06-24 NOTE — Discharge Instructions (Signed)
Cerumen Impaction The structures of the external ear canal secrete a waxy substance known as cerumen. Excess cerumen can build up in the ear canal, causing a condition known as cerumen impaction. Cerumen impaction can cause ear pain and disrupt the function of the ear. The rate of cerumen production differs for each individual. In certain individuals, the configuration of the ear canal may decrease his or her ability to naturally remove cerumen. CAUSES Cerumen impaction is caused by excessive cerumen production or buildup. RISK FACTORS  Frequent use of swabs to clean ears.  Having narrow ear canals.  Having eczema.  Being dehydrated. SIGNS AND SYMPTOMS  Diminished hearing.  Ear drainage.  Ear pain.  Ear itch. TREATMENT Treatment may involve:  Over-the-counter or prescription ear drops to soften the cerumen.  Removal of cerumen by a health care provider. This may be done with:  Irrigation with warm water. This is the most common method of removal.  Ear curettes and other instruments.  Surgery. This may be done in severe cases. HOME CARE INSTRUCTIONS  Take medicines only as directed by your health care provider.  Do not insert objects into the ear with the intent of cleaning the ear. PREVENTION  Do not insert objects into the ear, even with the intent of cleaning the ear. Removing cerumen as a part of normal hygiene is not necessary, and the use of swabs in the ear canal is not recommended.  Drink enough water to keep your urine clear or pale yellow.  Control your eczema if you have it. SEEK MEDICAL CARE IF:  You develop ear pain.  You develop bleeding from the ear.  The cerumen does not clear after you use ear drops as directed.   This information is not intended to replace advice given to you by your health care provider. Make sure you discuss any questions you have with your health care provider.   Document Released: 06/27/2004 Document Revised: 06/10/2014  Document Reviewed: 01/04/2015 Elsevier Interactive Patient Education 2016 Elsevier Inc.  

## 2015-06-24 NOTE — ED Notes (Signed)
Pt left ear flushed. Pt now has left ear drum now visible.

## 2015-06-24 NOTE — ED Notes (Signed)
Left ear stopped up

## 2015-06-27 NOTE — ED Provider Notes (Signed)
CSN: [id number]     Arrival date & time 06/24/15  0920 History   First MD Initiated Contact with Patient 06/24/15 [phone]     Chief Complaint  Patient presents with  . Ear Fullness     (Consider location/radiation/quality/duration/timing/severity/associated sxs/prior Treatment) The history is provided by the patient and a parent.   Allen Harding is a 20 y.o. male    Allen Harding is a 20 y.o. male presenting requesting ear flushing as he suspected he has cerumen impactions, a problem he has had in the past.  He describes left ear fullness and decreased hearing.  He denies pain and drainage.  He has tried an Engineer, maintenance (IT) which was ineffective.     Past Medical History  Diagnosis Date  . Anxiety   . Diabetes mellitus   . 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 Hypertension    Past Surgical History  Procedure Laterality Date  . Circumcision    . Circumcision revision    . Lipoma    . Orchiopexy    . Frenulectomy, lingual     Family History  Problem Relation Age of Onset  . Obesity Mother   . Diabetes Mother   . Hypertension Mother   . Hyperlipidemia Mother   . Obesity Sister   . Obesity Maternal Grandmother   . Diabetes Maternal Grandmother   . Cancer Maternal Grandmother   . Stroke Maternal Grandmother   . Obesity Maternal Grandfather    Social History  Substance Use Topics  . Smoking status: Never Smoker   . Smokeless tobacco: Never Used  . Alcohol Use: No    Review of Systems  Constitutional: Negative for fever and chills.  HENT: Positive for hearing loss, rhinorrhea and sore throat. Negative for congestion, ear discharge, ear pain, sinus pressure, trouble swallowing and voice change.   Eyes: Negative for discharge.  Respiratory: Negative for cough, shortness of breath, wheezing and stridor.   Cardiovascular: Negative for chest pain.  Gastrointestinal: Negative for abdominal pain.  Genitourinary: Negative.       Allergies  Review  of patient's allergies indicates no known allergies.  Home Medications   Prior to Admission medications   Medication Sig Start Date End Date Taking? Authorizing Provider  cetirizine (ZYRTEC) 10 MG tablet TAKE ONE TABLET BY MOUTH DAILY. 12/14/14  Yes Kyra Manges McDonell, MD  chlorthalidone (HYGROTON) 25 MG tablet Take 50 mg by mouth. 04/25/15  Yes Historical Provider, MD  cholecalciferol (VITAMIN D) 400 UNITS TABS tablet Take 5,000 Units by mouth daily.   Yes Historical Provider, MD  enalapril (VASOTEC) 20 MG tablet Take 1 tablet (20 mg total) by mouth daily. 04/20/15  Yes Trude Mcburney, FNP  ferrous sulfate 325 (65 FE) MG tablet Take 1 tablet (325 mg total) by mouth daily with breakfast. 09/29/14  Yes Trude Mcburney, FNP  guanFACINE (INTUNIV) 2 MG TB24 Take 2 mg by mouth daily.    Yes Historical Provider, MD  letrozole (FEMARA) 2.5 MG tablet Take 1 tablet (2.5 mg total) by mouth daily. 04/20/15  Yes Trude Mcburney, FNP  lisdexamfetamine (VYVANSE) 20 MG capsule Take 70 mg by mouth every morning.    Yes Historical Provider, MD  metFORMIN (GLUCOPHAGE) 1000 MG tablet TAKE (1) TABLET BY MOUTH TWICE DAILY WITH FOOD FOR DIABETES. 12/12/14  Yes Trude Mcburney, FNP  omeprazole (PRILOSEC) 40 MG capsule Take 1 capsule (40 mg total) by mouth 2 (two) times daily. 04/05/14  Yes  Lelon Huh, MD  oxybutynin (DITROPAN-XL) 10 MG 24 hr tablet TAKE 1 TABLET BY MOUTH DAILY. 12/14/14  Yes Kyra Manges McDonell, MD  sertraline (ZOLOFT) 25 MG tablet Take 100 mg by mouth daily. Reported on 06/16/2015   Yes Historical Provider, MD  SYRINGE-NEEDLE, DISP, 3 ML (BD ECLIPSE SYRINGE) 21G X 1" 3 ML MISC by Does not apply route. 12/15/14  Yes Historical Provider, MD  testosterone cypionate (DEPOTESTOSTERONE CYPIONATE) 200 MG/ML injection Inject 0.75 mLs (150 mg total) into the muscle every 14 (fourteen) days. 05/25/15  Yes Trude Mcburney, FNP  traZODone (DESYREL) 100 MG tablet Take 100 mg by mouth at bedtime. Reported on  06/16/2015   Yes Historical Provider, MD   BP 146/92 mmHg  Pulse 113  Temp(Src) 98.4 F (36.9 C) (Oral)  Resp 16  Ht 5' 9.5" (1.765 m)  Wt 153.316 kg  BMI 49.22 kg/m2  SpO2 98% Physical Exam  Constitutional: He is oriented to person, place, and time. He appears well-developed and well-nourished.  HENT:  Head: Normocephalic and atraumatic.  Right Ear: Tympanic membrane normal.  Left Ear: Tympanic membrane normal.  Nose: Mucosal edema and rhinorrhea present.  Mouth/Throat: Uvula is midline, oropharynx is clear and moist and mucous membranes are normal. No oropharyngeal exudate, posterior oropharyngeal edema, posterior oropharyngeal erythema or tonsillar abscesses.  Bilateral TM impactions.  Eyes: Conjunctivae are normal.  Cardiovascular: Normal rate and normal heart sounds.   Pulmonary/Chest: Effort normal. No respiratory distress. He has no wheezes. He has no rales.  Abdominal: Soft. There is no tenderness.  Musculoskeletal: Normal range of motion.  Neurological: He is alert and oriented to person, place, and time.  Skin: Skin is warm and dry. No rash noted.  Psychiatric: He has a normal mood and affect.    ED Course  Procedures (including critical care time)  Right TM cerumen easily removed using ear curette, patient tolerated well.  No trauma to ear canal, normal TM.  Left TM flushed by Rn using NS after soaking canal with hydrogen peroxide.  Again pt tolerated well.  TM normal, external canal normal upon re-exam. Labs Review Labs Reviewed - No data to display  Imaging Review No results found. I have personally reviewed and evaluated these images and lab results as part of my medical decision-making.   EKG Interpretation None      MDM   Final diagnoses:  Cerumen impaction, bilateral    Prn f/u anticipated.    Evalee Jefferson, PA-C 06/27/15 1411  Ezequiel Essex, MD 06/27/15 [phone]

## 2015-08-03 ENCOUNTER — Encounter: Payer: Self-pay | Admitting: Pediatric Endocrinology

## 2015-08-03 ENCOUNTER — Ambulatory Visit (INDEPENDENT_AMBULATORY_CARE_PROVIDER_SITE_OTHER): Payer: Medicaid Other | Admitting: Pediatric Endocrinology

## 2015-08-03 DIAGNOSIS — IMO0002 Reserved for concepts with insufficient information to code with codable children: Secondary | ICD-10-CM

## 2015-08-03 DIAGNOSIS — N62 Hypertrophy of breast: Secondary | ICD-10-CM

## 2015-08-03 DIAGNOSIS — E291 Testicular hypofunction: Secondary | ICD-10-CM

## 2015-08-03 DIAGNOSIS — I10 Essential (primary) hypertension: Secondary | ICD-10-CM | POA: Diagnosis not present

## 2015-08-03 DIAGNOSIS — Z68.41 Body mass index (BMI) pediatric, greater than or equal to 95th percentile for age: Secondary | ICD-10-CM

## 2015-08-03 NOTE — Patient Instructions (Signed)
Fasting labs this week.  I will send a note to Dr. [name] about your blood pressure- he may call you to make changes to your does.  Continue avoiding liquid sugar.  No changes to doses today. May need to change testosterone dose depending on labs.   Try for an entry level job some place near home such as [employer], [employer], [employer].

## 2015-08-03 NOTE — Progress Notes (Signed)
Subjective:  Subjective Patient Name: Allen Harding Date of Birth: [birth year]  MRN: [id number]  Allen Harding  presents to the office today for follow-up and management of his prediabetes, morbid obesity, hypertension, depression, dyspepsia, genetic chromosomal abnormality syndrome, precocity, gynecomastia, microphallus, hypogonadism, ADHD, tachycardia, goiter, behavior disorder, and hypercholesterolemia   HISTORY OF PRESENT ILLNESS:   Allen Harding is a 20 y.o. Caucasian male   Allen Harding was accompanied by his mother   1.  "Allen Harding" was first referred to our clinic on 09/10/06 by his his cardiologist, Dr. [name] of [facility], for evaluation and management of prediabetes and obesity. He was 3-1/21 years old. His medical history is significant for abnormal chromosomes and developmental delay with walking and speech development at about age 49 year. At age 46 he still had some gross motor motor and fine motor problems. The family had been concerned about his weight since the first grade.     2. The patient's last [facility] visit was on 05/02/15. Allen Harding has been generally healthy since his last visit.    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   He has been dating online but not IRL. There is a girl in [place] that he wants to meet but mom won't let him travel.  He is not doing much of anything the other days of the week. Mom got a membership to the [facility] and goes 4 days a week but [name] does not go with her. He is on the membership.  It has been very stressful and emotional at home. Grandmother continues to make everyone frustrated.  He saw Dr. [name] last in November. He is still struggling with taking his medications. Mom says that he will only take them if she is standing right there. She cannot call him to remind him to take his medication because he does not follow through.   His grandmother has terminal lung cancer and he is having a very hard  time with this. He is seeing a Quarry manager Jasmine December) about every 2 weeks. He continued on zoloft for his depression and feels that it is helping. They are having a lot of continued stress as grandmother has become very irritable and yells at everyone. He feels that this is worse.  He continues on enalapril 20mg  for his BP. He has been taking it every day. He is also taking Chlorthalidone 50 mg for his BP per Cardiology. (increased last week by Cardiology).  He continues on Metformin 1000mg  BID for his type 2 diabetes. He reports good compliance. He is not having any stomach upset.  He continues on Testosterone 0.9ml every 2 weeks. - last injection 1 week ago.   He has been drinking some water but still drinking some soda.    3. Pertinent Review of Systems:  Constitutional: The patient feels "a little tired". He is not having headaches Eyes: Vision seems to be good. There are no recognized eye problems. Wears glasses- eye exam in June 2016. Got new glasses Neck: The patient has no complaints of anterior neck swelling, soreness, tenderness, pressure, discomfort, or difficulty swallowing.   Heart: Heart rate increases with exercise or other physical activity. The patient has no complaints of palpitations, irregular heart beats, chest pain, or chest pressure.   Gastrointestinal: Bowel movents seem normal. The patient has no complaints of excessive hunger, acid reflux, upset stomach, stomach aches or pains, diarrhea, or constipation.  Legs: Muscle mass and strength  seem normal. There are no complaints of numbness, tingling, burning, or pain. No edema is noted.  Feet: There are no obvious foot problems. There are no complaints of numbness, tingling, burning, or pain. No edema is noted. Neurologic: There are no recognized problems with muscle movement and strength, sensation, or coordination. GYN/GU: no nocturia if he takes his oxybutin- but otherwise still with primary enuresis. Had renal ultrasound and  structurally normal   PAST MEDICAL, FAMILY, AND SOCIAL HISTORY  Past Medical History  Diagnosis Date  . Anxiety   . Diabetes mellitus   . 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 Hypertension     Family History  Problem Relation Age of Onset  . Obesity Mother   . Diabetes Mother   . Hypertension Mother   . Hyperlipidemia Mother   . Obesity Sister   . Obesity Maternal Grandmother   . Diabetes Maternal Grandmother   . Cancer Maternal Grandmother   . Stroke Maternal Grandmother   . Obesity Maternal Grandfather      Current outpatient prescriptions:  .  cetirizine (ZYRTEC) 10 MG tablet, TAKE ONE TABLET BY MOUTH DAILY., Disp: 30 tablet, Rfl: 5 .  chlorthalidone (HYGROTON) 25 MG tablet, Take 50 mg by mouth., Disp: , Rfl:  .  cholecalciferol (VITAMIN D) 400 UNITS TABS tablet, Take 5,000 Units by mouth daily., Disp: , Rfl:  .  enalapril (VASOTEC) 20 MG tablet, Take 1 tablet (20 mg total) by mouth daily., Disp: 30 tablet, Rfl: 3 .  guanFACINE (INTUNIV) 2 MG TB24, Take 2 mg by mouth daily. , Disp: , Rfl:  .  letrozole (FEMARA) 2.5 MG tablet, Take 1 tablet (2.5 mg total) by mouth daily., Disp: 30 tablet, Rfl: 3 .  lisdexamfetamine (VYVANSE) 20 MG capsule, Take 70 mg by mouth every morning. , Disp: , Rfl:  .  metFORMIN (GLUCOPHAGE) 1000 MG tablet, TAKE (1) TABLET BY MOUTH TWICE DAILY WITH FOOD FOR DIABETES., Disp: 60 tablet, Rfl: 6 .  omeprazole (PRILOSEC) 40 MG capsule, Take 1 capsule (40 mg total) by mouth 2 (two) times daily., Disp: 60 capsule, Rfl: 6 .  oxybutynin (DITROPAN-XL) 10 MG 24 hr tablet, TAKE 1 TABLET BY MOUTH DAILY., Disp: 30 tablet, Rfl: 3 .  sertraline (ZOLOFT) 25 MG tablet, Take 100 mg by mouth daily. Reported on 06/16/2015, Disp: , Rfl:  .  SYRINGE-NEEDLE, DISP, 3 ML (BD ECLIPSE SYRINGE) 21G X 1" 3 ML MISC, by Does not apply route., Disp: , Rfl:  .  testosterone cypionate (DEPOTESTOSTERONE CYPIONATE) 200 MG/ML injection, Inject 0.75 mLs (150 mg total)  into the muscle every 14 (fourteen) days., Disp: 2 mL, Rfl: 5 .  traZODone (DESYREL) 100 MG tablet, Take 100 mg by mouth at bedtime. Reported on 06/16/2015, Disp: , Rfl:  .  ferrous sulfate 325 (65 FE) MG tablet, Take 1 tablet (325 mg total) by mouth daily with breakfast., Disp: 30 tablet, Rfl: 3 .  [DISCONTINUED] oxybutynin (DITROPAN) 5 MG tablet, Take 10 mg by mouth Nightly. , Disp: , Rfl:   Allergies as of 08/03/2015  . (No Known Allergies)     reports that he has never smoked. He has never used smokeless tobacco. He reports that he does not drink alcohol or use illicit drugs. Pediatric History  Patient Guardian Status  . Mother:  Donney, Caraveo   Other Topics Concern  . Not on file   Social History Narrative   Lives with mom, sister, 2 nieces, grandparents and sister's fiance.  Graduated HS. Has been accepted for community college. 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   Primary Care Provider: Shaaron Adler, MD  ROS: There are no other significant problems involving Mykah's other body systems.    Objective:  Objective Vital Signs:  BP 162/92 mmHg  Pulse 124  Ht 5' 9.53" (1.766 m)  Wt 344 lb 6.4 oz (156.219 kg)  BMI 50.09 kg/m2   Ht Readings from Last 3 Encounters:  08/03/15 5' 9.53" (1.766 m)  06/24/15 5' 9.5" (1.765 m)  09/13/14 5' 8.5" (1.74 m) (35 %*, Z = -0.38)   * Growth percentiles are based on CDC 2-20 Years data.   Wt Readings from Last 3 Encounters:  08/03/15 344 lb 6.4 oz (156.219 kg)  06/24/15 338 lb (153.316 kg)  06/16/15 337 lb (152.862 kg)   HC Readings from Last 3 Encounters:  No data found for [facility]   Body surface area is 2.77 meters squared. Facility age limit for growth percentiles is 20 years. Facility age limit for growth percentiles is 20 years.    PHYSICAL EXAM:  Constitutional: The patient appears healthy and well nourished. The patient's height and weight are advanced for age.  Head: The head  is normocephalic. Face: The face appears normal. There are no obvious dysmorphic features. Eyes: The eyes appear to be normally formed and spaced. Gaze is conjugate. There is no obvious arcus or proptosis. Moisture appears normal. Ears: The ears are normally placed and appear externally normal. Mouth: The oropharynx and tongue appear normal. Dentition appears to be normal for age. Oral moisture is normal. Neck: The neck appears to be visibly normal. The thyroid gland is 18+ grams in size. The consistency of the thyroid gland is normal. The thyroid gland is not tender to palpation. Lungs: The lungs are clear to auscultation. Air movement is good. Heart: Heart rate and rhythm are regular. Heart sounds S1 and S2 are normal. I did not appreciate any pathologic cardiac murmurs. Abdomen: The abdomen appears to be morbidly in size for the patient's age. Bowel sounds are normal. Unable to assess hepatomegaly, splenomegaly, or other mass effect.  Arms: Muscle size and bulk are normal for age. Hands: There is no obvious tremor. Phalangeal and metacarpophalangeal joints are normal. Palmar muscles are normal for age. Palmar skin is normal. Palmar moisture is also normal. Legs: Muscles appear normal for age. No edema is present. Feet: Feet are normally formed. Dorsalis pedal pulses are normal. Neurologic: Strength is normal for age in both the upper and lower extremities. Muscle tone is normal. Sensation to touch is normal in both the legs and feet.   GYN/GU: +2 gynecomastia  LAB DATA:  No results found for this or any previous visit (from the past 672 hour(s)).      Assessment and Plan:  Assessment    ASSESSMENT:  1. Morbid obesity- weight has decreased since last visit.  2. Hypertension- BP continues to be elevated. Saw Dr. [name] last fall. Still sometimes forgetting medication. May need dose change vs improved compliance.  3. Prediabetes- continues to be prediabetic. Taking metformin usually only  once a day.  Feels he is doing better with evening dose- but also admits that he sometimes misses all his meds.  4. Gynecomastia- consistent with continued morbid obesity. 5. Hypogonadism- Now on testosterone every other week 150 mg.  6. Elevated triglycerides- continue to be elevated- will repeat this week  PLAN:  1. Diagnostic:A1C as above 2. Therapeutic: Continue enalapril 20  mg daily, continue chlorthalidone 50 mg daily for BP. Continue metformin and testosterone. 3. Patient education: Reviewed growth data. Discussed challenges and goals. Continued high stress at home- but now on Zoloft. Discussed working on getting a job to increase physical activity and time out of his home. Discussed options for entry level positions. Will forward note to Cardiology regarding BP concerns.  Allen Harding asked many appropriate questions and seemed satisfied with visit today.  4. Follow-up: Return in about 3 months (around 11/03/2015).      Cammie Sickle, MD Level of Service: This visit lasted in excess of 40 minutes. More than 50% of the visit was devoted to counseling.

## 2015-08-08 ENCOUNTER — Other Ambulatory Visit: Payer: Self-pay | Admitting: Pediatric Endocrinology

## 2015-08-08 LAB — CBC WITH DIFFERENTIAL/PLATELET
Basophils Absolute: 0 10*3/uL (ref 0.0–0.1)
Basophils Relative: 0 % (ref 0–1)
Eosinophils Absolute: 0.4 10*3/uL (ref 0.0–0.7)
Eosinophils Relative: 3 % (ref 0–5)
HEMATOCRIT: 42.1 % (ref 39.0–52.0)
HEMOGLOBIN: 14.4 g/dL (ref 13.0–17.0)
LYMPHS ABS: 3.2 10*3/uL (ref 0.7–4.0)
LYMPHS PCT: 24 % (ref 12–46)
MCH: 28.6 pg (ref 26.0–34.0)
MCHC: 34.2 g/dL (ref 30.0–36.0)
MCV: 83.5 fL (ref 78.0–100.0)
MONO ABS: 0.9 10*3/uL (ref 0.1–1.0)
MONOS PCT: 7 % (ref 3–12)
MPV: 8.6 fL (ref 8.6–12.4)
NEUTROS ABS: 8.7 10*3/uL — AB (ref 1.7–7.7)
Neutrophils Relative %: 66 % (ref 43–77)
Platelets: 375 10*3/uL (ref 150–400)
RBC: 5.04 MIL/uL (ref 4.22–5.81)
RDW: 15.3 % (ref 11.5–15.5)
WBC: 13.2 10*3/uL — ABNORMAL HIGH (ref 4.0–10.5)

## 2015-08-09 LAB — TSH: TSH: 1.79 mIU/L (ref 0.40–4.50)

## 2015-08-09 LAB — HEMOGLOBIN A1C
Hgb A1c MFr Bld: 6 % — ABNORMAL HIGH (ref <5.7)
Mean Plasma Glucose: 126 mg/dL — ABNORMAL HIGH (ref <117)

## 2015-08-09 LAB — COMPREHENSIVE METABOLIC PANEL
ALT: 25 U/L (ref 9–46)
AST: 20 U/L (ref 10–40)
Albumin: 3.9 g/dL (ref 3.6–5.1)
Alkaline Phosphatase: 95 U/L (ref 40–115)
BUN: 14 mg/dL (ref 7–25)
CHLORIDE: 102 mmol/L (ref 98–110)
CO2: 27 mmol/L (ref 20–31)
Calcium: 9.5 mg/dL (ref 8.6–10.3)
Creat: 0.8 mg/dL (ref 0.60–1.35)
GLUCOSE: 82 mg/dL (ref 70–99)
POTASSIUM: 4.1 mmol/L (ref 3.5–5.3)
Sodium: 141 mmol/L (ref 135–146)
Total Bilirubin: 0.6 mg/dL (ref 0.2–1.2)
Total Protein: 6.9 g/dL (ref 6.1–8.1)

## 2015-08-09 LAB — C-PEPTIDE: C-Peptide: 3.35 ng/mL (ref 0.80–3.85)

## 2015-08-09 LAB — LIPID PANEL
CHOL/HDL RATIO: 4.8 ratio (ref <=5.0)
Cholesterol: 222 mg/dL — ABNORMAL HIGH (ref 125–170)
HDL: 46 mg/dL (ref >=40)
LDL CALC: 155 mg/dL — AB (ref <110)
TRIGLYCERIDES: 105 mg/dL (ref <150)
VLDL: 21 mg/dL (ref <30)

## 2015-08-09 LAB — ESTRADIOL: ESTRADIOL: 22 pg/mL (ref <=39)

## 2015-08-09 LAB — FOLLICLE STIMULATING HORMONE: FSH: 20 m[IU]/mL — AB (ref 1.6–8.0)

## 2015-08-09 LAB — LUTEINIZING HORMONE: LH: 13.2 m[IU]/mL — ABNORMAL HIGH (ref 1.5–9.3)

## 2015-08-09 LAB — VITAMIN D 25 HYDROXY (VIT D DEFICIENCY, FRACTURES): VIT D 25 HYDROXY: 27 ng/mL — AB (ref 30–100)

## 2015-08-15 LAB — TESTOSTERONE, FREE AND TOTAL (INCLUDES SHBG)-(MALES)
SEX HORMONE BINDING: 12 nmol/L (ref 10–50)
TESTOSTERONE FREE: 161.1 pg/mL (ref 47.0–244.0)
TESTOSTERONE: 502 ng/dL (ref 250–827)
Testosterone-% Free: 3.2 % — ABNORMAL HIGH (ref 1.6–2.9)

## 2015-08-18 ENCOUNTER — Other Ambulatory Visit: Payer: Self-pay | Admitting: *Deleted

## 2015-08-18 DIAGNOSIS — R1013 Epigastric pain: Secondary | ICD-10-CM

## 2015-08-18 MED ORDER — OMEPRAZOLE 40 MG PO CPDR: 40.0000 mg | DELAYED_RELEASE_CAPSULE | Freq: Two times a day (BID) | ORAL | Status: DC

## 2015-08-21 ENCOUNTER — Encounter: Payer: Self-pay | Admitting: *Deleted

## 2015-08-30 ENCOUNTER — Other Ambulatory Visit: Payer: Self-pay | Admitting: *Deleted

## 2015-08-30 ENCOUNTER — Other Ambulatory Visit: Payer: Self-pay | Admitting: Pediatric Endocrinology

## 2015-08-30 DIAGNOSIS — E291 Testicular hypofunction: Secondary | ICD-10-CM

## 2015-11-06 ENCOUNTER — Ambulatory Visit: Payer: Self-pay | Admitting: Pediatric Endocrinology

## 2015-11-16 ENCOUNTER — Observation Stay (HOSPITAL_COMMUNITY)
Admission: EM | Admit: 2015-11-16 | Discharge: 2015-11-17 | Disposition: A | Discharge status: Home / Self Care | Payer: Medicaid Other | Attending: Internal Medicine | Admitting: Internal Medicine

## 2015-11-16 ENCOUNTER — Emergency Department (HOSPITAL_COMMUNITY): Payer: Medicaid Other

## 2015-11-16 ENCOUNTER — Encounter (HOSPITAL_COMMUNITY): Payer: Self-pay

## 2015-11-16 DIAGNOSIS — I1A0 Resistant hypertension: Secondary | ICD-10-CM | POA: Diagnosis present

## 2015-11-16 DIAGNOSIS — R000 Tachycardia, unspecified: Principal | ICD-10-CM | POA: Diagnosis present

## 2015-11-16 DIAGNOSIS — F909 Attention-deficit hyperactivity disorder, unspecified type: Secondary | ICD-10-CM | POA: Diagnosis present

## 2015-11-16 DIAGNOSIS — I10 Essential (primary) hypertension: Secondary | ICD-10-CM | POA: Diagnosis not present

## 2015-11-16 DIAGNOSIS — Z7984 Long term (current) use of oral hypoglycemic drugs: Secondary | ICD-10-CM | POA: Insufficient documentation

## 2015-11-16 DIAGNOSIS — E119 Type 2 diabetes mellitus without complications: Secondary | ICD-10-CM | POA: Insufficient documentation

## 2015-11-16 DIAGNOSIS — R7303 Prediabetes: Secondary | ICD-10-CM | POA: Diagnosis present

## 2015-11-16 DIAGNOSIS — Z79899 Other long term (current) drug therapy: Secondary | ICD-10-CM | POA: Insufficient documentation

## 2015-11-16 HISTORY — DX: Attention-deficit hyperactivity disorder, unspecified type: F90.9

## 2015-11-16 LAB — D-DIMER, QUANTITATIVE (NOT AT ARMC): D DIMER QUANT: 0.38 ug{FEU}/mL (ref 0.00–0.50)

## 2015-11-16 LAB — CBC WITH DIFFERENTIAL/PLATELET
BASOS ABS: 0 10*3/uL (ref 0.0–0.1)
BASOS PCT: 0 %
EOS ABS: 0.4 10*3/uL (ref 0.0–0.7)
EOS PCT: 2 %
HCT: 45.6 % (ref 39.0–52.0)
Hemoglobin: 14.9 g/dL (ref 13.0–17.0)
Lymphocytes Relative: 31 %
Lymphs Abs: 4.5 10*3/uL — ABNORMAL HIGH (ref 0.7–4.0)
MCH: 27.2 pg (ref 26.0–34.0)
MCHC: 32.7 g/dL (ref 30.0–36.0)
MCV: 83.2 fL (ref 78.0–100.0)
MONO ABS: 1.3 10*3/uL — AB (ref 0.1–1.0)
Monocytes Relative: 9 %
Neutro Abs: 8.5 10*3/uL — ABNORMAL HIGH (ref 1.7–7.7)
Neutrophils Relative %: 58 %
PLATELETS: 407 10*3/uL — AB (ref 150–400)
RBC: 5.48 MIL/uL (ref 4.22–5.81)
RDW: 14.3 % (ref 11.5–15.5)
WBC: 14.7 10*3/uL — ABNORMAL HIGH (ref 4.0–10.5)

## 2015-11-16 LAB — URINE MICROSCOPIC-ADD ON

## 2015-11-16 LAB — COMPREHENSIVE METABOLIC PANEL
ALT: 29 U/L (ref 17–63)
AST: 27 U/L (ref 15–41)
Albumin: 3.9 g/dL (ref 3.5–5.0)
Alkaline Phosphatase: 106 U/L (ref 38–126)
Anion gap: 8 (ref 5–15)
BUN: 14 mg/dL (ref 6–20)
CHLORIDE: 101 mmol/L (ref 101–111)
CO2: 28 mmol/L (ref 22–32)
Calcium: 9.8 mg/dL (ref 8.9–10.3)
Creatinine, Ser: 0.86 mg/dL (ref 0.61–1.24)
Glucose, Bld: 97 mg/dL (ref 65–99)
POTASSIUM: 3.1 mmol/L — AB (ref 3.5–5.1)
Sodium: 137 mmol/L (ref 135–145)
Total Bilirubin: 0.5 mg/dL (ref 0.3–1.2)
Total Protein: 8.2 g/dL — ABNORMAL HIGH (ref 6.5–8.1)

## 2015-11-16 LAB — URINALYSIS, ROUTINE W REFLEX MICROSCOPIC
Bilirubin Urine: NEGATIVE
GLUCOSE, UA: NEGATIVE mg/dL
Ketones, ur: NEGATIVE mg/dL
LEUKOCYTES UA: NEGATIVE
Nitrite: NEGATIVE
PH: 7 (ref 5.0–8.0)
PROTEIN: NEGATIVE mg/dL
Specific Gravity, Urine: 1.015 (ref 1.005–1.030)

## 2015-11-16 LAB — RAPID URINE DRUG SCREEN, HOSP PERFORMED
Amphetamines: POSITIVE — AB
BARBITURATES: NOT DETECTED
Benzodiazepines: NOT DETECTED
COCAINE: NOT DETECTED
OPIATES: NOT DETECTED
Tetrahydrocannabinol: NOT DETECTED

## 2015-11-16 LAB — TROPONIN I

## 2015-11-16 LAB — MAGNESIUM: MAGNESIUM: 1.9 mg/dL (ref 1.7–2.4)

## 2015-11-16 LAB — TSH: TSH: 1.83 u[IU]/mL (ref 0.350–4.500)

## 2015-11-16 MED ORDER — ADENOSINE 6 MG/2ML IV SOLN
6.0000 mg | Freq: Once | INTRAVENOUS | Status: DC
  Filled 2015-11-16: qty 2

## 2015-11-16 MED ORDER — LORATADINE 10 MG PO TABS
10.0000 mg | ORAL_TABLET | Freq: Every day | ORAL | Status: DC
Start: 2015-11-16 — End: 2015-11-17
  Administered 2015-11-17: 10 mg via ORAL
  Filled 2015-11-16: qty 1

## 2015-11-16 MED ORDER — POLYETHYLENE GLYCOL 3350 17 G PO PACK: 17.0000 g | PACK | Freq: Every day | ORAL | Status: DC | PRN

## 2015-11-16 MED ORDER — ONDANSETRON HCL 4 MG/2ML IJ SOLN: 4.0000 mg | Freq: Four times a day (QID) | INTRAMUSCULAR | Status: DC | PRN

## 2015-11-16 MED ORDER — LETROZOLE 2.5 MG PO TABS
2.5000 mg | ORAL_TABLET | Freq: Every day | ORAL | Status: DC
  Administered 2015-11-17: 2.5 mg via ORAL
  Filled 2015-11-16 (×5): qty 1

## 2015-11-16 MED ORDER — SERTRALINE HCL 50 MG PO TABS
100.0000 mg | ORAL_TABLET | Freq: Every day | ORAL | Status: DC
  Administered 2015-11-17: 100 mg via ORAL
  Filled 2015-11-16: qty 2

## 2015-11-16 MED ORDER — ACETAMINOPHEN 650 MG RE SUPP: 650.0000 mg | Freq: Four times a day (QID) | RECTAL | Status: DC | PRN

## 2015-11-16 MED ORDER — TRAZODONE HCL 50 MG PO TABS
100.0000 mg | ORAL_TABLET | Freq: Every day | ORAL | Status: DC
  Administered 2015-11-16: 100 mg via ORAL
  Filled 2015-11-16: qty 2

## 2015-11-16 MED ORDER — METOPROLOL TARTRATE 50 MG PO TABS
75.0000 mg | ORAL_TABLET | Freq: Two times a day (BID) | ORAL | Status: DC
  Administered 2015-11-16 – 2015-11-17 (×3): 75 mg via ORAL
  Filled 2015-11-16 (×4): qty 1

## 2015-11-16 MED ORDER — GUANFACINE HCL ER 1 MG PO TB24
2.0000 mg | ORAL_TABLET | Freq: Every day | ORAL | Status: DC
  Administered 2015-11-17: 2 mg via ORAL
  Filled 2015-11-16 (×3): qty 2
  Filled 2015-11-16: qty 1

## 2015-11-16 MED ORDER — METOPROLOL TARTRATE 5 MG/5ML IV SOLN
5.0000 mg | Freq: Once | INTRAVENOUS | Status: AC
  Administered 2015-11-16: 5 mg via INTRAVENOUS
  Filled 2015-11-16: qty 5

## 2015-11-16 MED ORDER — LISDEXAMFETAMINE DIMESYLATE 70 MG PO CAPS: 70.0000 mg | ORAL_CAPSULE | Freq: Every day | ORAL | Status: DC

## 2015-11-16 MED ORDER — METFORMIN HCL 500 MG PO TABS
1000.0000 mg | ORAL_TABLET | Freq: Two times a day (BID) | ORAL | Status: DC
  Administered 2015-11-17 (×2): 1000 mg via ORAL
  Filled 2015-11-16 (×3): qty 2

## 2015-11-16 MED ORDER — TESTOSTERONE CYPIONATE 200 MG/ML IM SOLN
150.0000 mg | INTRAMUSCULAR | Status: DC
  Administered 2015-11-17: 150 mg via INTRAMUSCULAR
  Filled 2015-11-16: qty 0.75

## 2015-11-16 MED ORDER — PANTOPRAZOLE SODIUM 40 MG PO TBEC
80.0000 mg | DELAYED_RELEASE_TABLET | Freq: Every day | ORAL | Status: DC
  Administered 2015-11-17: 80 mg via ORAL
  Filled 2015-11-16: qty 2

## 2015-11-16 MED ORDER — ENALAPRIL MALEATE 5 MG PO TABS
10.0000 mg | ORAL_TABLET | Freq: Every day | ORAL | Status: DC
  Administered 2015-11-17: 10 mg via ORAL
  Filled 2015-11-16: qty 2

## 2015-11-16 MED ORDER — ONDANSETRON HCL 4 MG PO TABS: 4.0000 mg | ORAL_TABLET | Freq: Four times a day (QID) | ORAL | Status: DC | PRN

## 2015-11-16 MED ORDER — ACETAMINOPHEN 325 MG PO TABS: 650.0000 mg | ORAL_TABLET | Freq: Four times a day (QID) | ORAL | Status: DC | PRN

## 2015-11-16 MED ORDER — OXYBUTYNIN CHLORIDE ER 5 MG PO TB24
10.0000 mg | ORAL_TABLET | Freq: Every day | ORAL | Status: DC
  Administered 2015-11-16: 10 mg via ORAL
  Filled 2015-11-16: qty 2

## 2015-11-16 MED ORDER — SODIUM CHLORIDE 0.9% FLUSH
3.0000 mL | Freq: Two times a day (BID) | INTRAVENOUS | Status: DC
  Administered 2015-11-16 – 2015-11-17 (×2): 3 mL via INTRAVENOUS

## 2015-11-16 MED ORDER — LISINOPRIL 5 MG PO TABS
5.0000 mg | ORAL_TABLET | Freq: Every day | ORAL | Status: DC
  Administered 2015-11-16: 5 mg via ORAL
  Filled 2015-11-16: qty 1

## 2015-11-16 NOTE — H&P (Signed)
History and Physical  Allen Harding [id number] DOB: 1996-01-04 DOA: 11/16/2015  Referring physician: Dr Manus Gunningancour, ED physician PCP: [name], MD Outpatient Specialists:   [name]    Chief Complaint: Tachycardia  HPI: Allen Harding is a 20 y.o. male with a history of with a history of diabetes, hypertension, ADHD, anxiety, gynecomastia. Patient was referred to emergency partner by his primary care physician who noted a heart rate of 150 in his office. A couple weeks ago, his heart rate was elevated at his psychiatrist's office as well. Patient reports no symptoms: No chest pain, blurred vision, lower extremity swelling, shortness of breath, headache. He otherwise feels well and does not feel the tachycardia. Of note, the patient has been drinking a lot of [employer]ountain [employer] and energy drinks. He has a very sedentary lifestyle. No pale eating or provoking factors.   Review of Systems:   Pt denies any fevers, chills, nausea, vomiting, diarrhea, constipation, abdominal pain, shortness of breath, dyspnea on exertion, orthopnea, cough, wheezing, palpitations, headache, vision changes, lightheadedness, dizziness, melena, rectal bleeding.  Review of systems are otherwise negative  Past Medical History  Diagnosis Date  . Anxiety   . Diabetes mellitus   . 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 Hypertension    Past Surgical History  Procedure Laterality Date  . Circumcision    . Circumcision revision    . Lipoma    . Orchiopexy    . Frenulectomy, lingual     Social History:  reports that he has never smoked. He has never used smokeless tobacco. He reports that he does not drink alcohol or use illicit drugs. Patient lives at Home  No Known Allergies  Family History  Problem Relation Age of Onset  . Obesity Mother   . Diabetes Mother   . Hypertension Mother   . Hyperlipidemia Mother   . Obesity Sister   . Obesity Maternal  Grandmother   . Diabetes Maternal Grandmother   . Cancer Maternal Grandmother   . Stroke Maternal Grandmother   . Obesity Maternal Grandfather      Prior to Admission medications   Medication Sig Start Date End Date Taking? Authorizing Provider  cetirizine (ZYRTEC) 10 MG tablet TAKE ONE TABLET BY MOUTH DAILY. 12/14/14  Yes Alfredia ClientMary Jo McDonell, MD  chlorthalidone (HYGROTON) 25 MG tablet Take 50 mg by mouth. 04/25/15  Yes Historical Provider, MD  cholecalciferol (VITAMIN D) 400 UNITS TABS tablet Take 5,000 Units by mouth daily.   Yes Historical Provider, MD  enalapril (VASOTEC) 20 MG tablet Take 1 tablet (20 mg total) by mouth daily. 04/20/15  Yes Verneda Skillaroline T Hacker, FNP  ferrous sulfate 325 (65 FE) MG tablet Take 1 tablet (325 mg total) by mouth daily with breakfast. 09/29/14  Yes Verneda Skillaroline T Hacker, FNP  guanFACINE (INTUNIV) 2 MG TB24 Take 2 mg by mouth daily.    Yes Historical Provider, MD  letrozole (FEMARA) 2.5 MG tablet Take 1 tablet (2.5 mg total) by mouth daily. 04/20/15  Yes Verneda Skillaroline T Hacker, FNP  lisdexamfetamine (VYVANSE) 20 MG capsule Take 70 mg by mouth every morning.    Yes Historical Provider, MD  metFORMIN (GLUCOPHAGE) 1000 MG tablet TAKE (1) TABLET BY MOUTH TWICE DAILY WITH FOOD FOR DIABETES. 12/12/14  Yes Verneda Skillaroline T Hacker, FNP  omeprazole (PRILOSEC) 40 MG capsule Take 1 capsule (40 mg total) by mouth 2 (two) times daily. 08/18/15  Yes Dessa PhiJennifer Badik, MD  oxybutynin (DITROPAN-XL) 10 MG  24 hr tablet TAKE 1 TABLET BY MOUTH DAILY. 12/14/14  Yes Alfredia Client McDonell, MD  sertraline (ZOLOFT) 25 MG tablet Take 100 mg by mouth daily. Reported on 06/16/2015   Yes Historical Provider, MD  SYRINGE-NEEDLE, DISP, 3 ML (BD ECLIPSE SYRINGE) 21G X 1" 3 ML MISC by Does not apply route. 12/15/14  Yes Historical Provider, MD  testosterone cypionate (DEPOTESTOSTERONE CYPIONATE) 200 MG/ML injection Inject 0.75 mLs (150 mg total) into the muscle every 14 (fourteen) days. 05/25/15  Yes Verneda Skill, FNP    traZODone (DESYREL) 100 MG tablet Take 100 mg by mouth at bedtime. Reported on 06/16/2015   Yes Historical Provider, MD    Physical Exam: BP 135/91 mmHg  Pulse 103  Resp 25  SpO2 97%  General: Young Caucasian male. Awake and alert and oriented x3. No acute cardiopulmonary distress.  HEENT: Normocephalic atraumatic.  Right and left ears normal in appearance.  Pupils equal, round, reactive to light. Extraocular muscles are intact. Sclerae anicteric and noninjected.  Moist mucosal membranes. No mucosal lesions.  Neck: Neck supple without lymphadenopathy. No carotid bruits. No masses palpated.  Cardiovascular: Regular rate with normal S1-S2 sounds. No murmurs, rubs, gallops auscultated. No JVD.  Respiratory: Good respiratory effort with no wheezes, rales, rhonchi. Lungs clear to auscultation bilaterally.  No accessory muscle use. Abdomen: Soft, nontender, nondistended. Active bowel sounds. No masses or hepatosplenomegaly  Skin: No rashes, lesions, or ulcerations.  Dry, warm to touch. 2+ dorsalis pedis and radial pulses. Musculoskeletal: No calf or leg pain. All major joints not erythematous nontender.  No upper or lower joint deformation.  Good ROM.  No contractures  Psychiatric: Intact judgment and insight. Pleasant and cooperative. Neurologic: No focal neurological deficits. Strength is 5/5 and symmetric in upper and lower extremities.  Cranial nerves II through XII are grossly intact.           Labs on Admission: I have personally reviewed following labs and imaging studies  CBC:  Recent Labs Lab 11/16/15 1220  WBC 14.7*  NEUTROABS 8.5*  HGB 14.9  HCT 45.6  MCV 83.2  PLT 407*   Basic Metabolic Panel:  Recent Labs Lab 11/16/15 1220  NA 137  K 3.1*  CL 101  CO2 28  GLUCOSE 97  BUN 14  CREATININE 0.86  CALCIUM 9.8  MG 1.9   GFR: CrCl cannot be calculated (Unknown ideal weight.). Liver Function Tests:  Recent Labs Lab 11/16/15 1220  AST 27  ALT 29  ALKPHOS 106   BILITOT 0.5  PROT 8.2*  ALBUMIN 3.9   No results for input(s): LIPASE, AMYLASE in the last 168 hours. No results for input(s): AMMONIA in the last 168 hours. Coagulation Profile: No results for input(s): INR, PROTIME in the last 168 hours. Cardiac Enzymes:  Recent Labs Lab 11/16/15 1220  TROPONINI <0.03   BNP (last 3 results) No results for input(s): PROBNP in the last 8760 hours. HbA1C: No results for input(s): HGBA1C in the last 72 hours. CBG: No results for input(s): GLUCAP in the last 168 hours. Lipid Profile: No results for input(s): CHOL, HDL, LDLCALC, TRIG, CHOLHDL, LDLDIRECT in the last 72 hours. Thyroid Function Tests:  Recent Labs  11/16/15 1400  TSH 1.830   Anemia Panel: No results for input(s): VITAMINB12, FOLATE, FERRITIN, TIBC, IRON, RETICCTPCT in the last 72 hours. Urine analysis:    Component Value Date/Time   COLORURINE YELLOW 11/16/2015 1320   APPEARANCEUR CLEAR 11/16/2015 1320   LABSPEC 1.015 11/16/2015 1320   PHURINE  7.0 11/16/2015 1320   GLUCOSEU NEGATIVE 11/16/2015 1320   HGBUR TRACE* 11/16/2015 1320   BILIRUBINUR NEGATIVE 11/16/2015 1320   KETONESUR NEGATIVE 11/16/2015 1320   PROTEINUR NEGATIVE 11/16/2015 1320   NITRITE NEGATIVE 11/16/2015 1320   LEUKOCYTESUR NEGATIVE 11/16/2015 1320   Sepsis Labs: (procalcitonin:4,lacticidven:4) )No results found for this or any previous visit (from the past 240 hour(s)).   Radiological Exams on Admission: Dg Chest Portable 1 View  11/16/2015  CLINICAL DATA:  Tachycardia EXAM: PORTABLE CHEST 1 VIEW COMPARISON:  None. FINDINGS: Lungs are clear. The heart is slightly enlarged with pulmonary vascularity within normal limits. No adenopathy. No bone lesions. IMPRESSION: No edema or consolidation.  Mild cardiac enlargement. Electronically Signed   By: Bretta Bang III M.D.   On: 11/16/2015 12:38    EKG: Independently reviewed. Sinus tachycardia.  Assessment/Plan: Principal Problem:    Tachycardia Active Problems:   Pre-diabetes   Morbid obesity (HCC)   Essential hypertension, benign   ADHD (attention deficit hyperactivity disorder)    This patient was discussed with the ED physician, including pertinent vitals, physical exam findings, labs, and imaging.  We also discussed care given by the ED provider.  #1 tachycardia  Observation on telemetry per cardiology recommendation  This is likely due to combination of the Vyvanse and energy/caffeinated drinks  Continue beta blocker: Metoprolol 75 mg twice a day  Echocardiogram #2 diabetes  Metformin 1000 mg twice a day #3 obesity #4 hypertension  We'll decrease Vasotec to 10 mg daily (he was taking 20 mg daily)  Continue chlorthalidone #5 ADHD  Continue home medications for now   DVT prophylaxis: Early ambulation, low risk factors Consultants: Cardiology Code Status: Full code Family Communication: Mother in the room  Disposition Plan: Observation overnight   Levie Heritage, DO [facility] Pager [phone]  If 7PM-7AM, please contact night-coverage [email or web address] Password [email or web address]

## 2015-11-16 NOTE — Consult Note (Signed)
CARDIOLOGY CONSULT NOTE   Patient ID: Allen Harding MRN: [id number] DOB/AGE: [birth year] 21 y.o.  Admit Date: 11/16/2015 Referring Physician: [name]  TSH elevated ,Primary Physician: Shaaron AdlerKavithashree Gnanasekar, MD Consulting Cardiologist: Charlton HawsNishan, Dejia Ebron Primary Cardiologist : Ace GinsBuck, MD-Pediatric Cardiologist. Given information on no A1 heart rate were checked and these were complaining flexor on complaint pain and swelling Reason for Consultation: Tachycardia   Clinical Summary [name] is a 20 y.o.male with known history of hypertension, psychiatric disorders, mild mental retardation, chromosomal abnormalities, and diabetes. Saw his psychiatrist approximately one month ago who noticed that his heart rate was running 134 bpm during routine vital signs. The patient is followed by [facility] GinsBuck, pediatric [facility]. He is followed by him due to hypertension. The patient was referred to his primary care physician by psychiatry due to elevated heart rate. He was seen by Dr. [name] in his office today with a heart rate of around 150 bpm per O2 sat monitor. The patient was sent to the emergency room we are asked to evaluate further. The patient in the emergency room was given IV metoprolol 5 mg times one. Labs have been drawn along with TSH.  The patient's family states that he drinks a lot of caffeine: Is [facility] [employer] and monster drinks throughout the day and stays up a lot at night time playing on his computer and phone. The patient denies any chest pain or dyspnea. He denies dizziness or edema. He states he is scared.  Labs in the ER were unremarkable. With the exception of cholesterol being elevated at 222, triglyceride is 105, HDL 46, LDL 155. He is not found to be anemic, but white blood cells were elevated. He is very sedentary. Would consider doing d-dimer to evaluate for PE he does not appear to be very dyspneic however. Chest x-ray revealed no edema with mild cardiac enlargement. EKG  revealed sinus tachycardia with a heart rate of 118 bpm. Family states that he is very sedentary.  No Known Allergies  Medications Scheduled Medications: . adenosine  6 mg Intravenous Once     Past Medical History  Diagnosis Date  . Anxiety   . Diabetes mellitus   . 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 Hypertension     Past Surgical History  Procedure Laterality Date  . Circumcision    . Circumcision revision    . Lipoma    . Orchiopexy    . Frenulectomy, lingual      Family History  Problem Relation Age of Onset  . Obesity Mother   . Diabetes Mother   . Hypertension Mother   . Hyperlipidemia Mother   . Obesity Sister   . Obesity Maternal Grandmother   . Diabetes Maternal Grandmother   . Cancer Maternal Grandmother   . Stroke Maternal Grandmother   . Obesity Maternal Grandfather     Social History [name] reports that he has never smoked. He has never used smokeless tobacco. [name] reports that he does not drink alcohol.  Review of Systems Complete review of systems are found to be negative unless outlined in H&P above.  Physical Examination There were no vitals taken for this visit. No intake or output data in the 24 hours ending 11/16/15 1242  Telemetry:Sinus tachycardia rates averaging between 115 and 127 bpm  GEN: Anxious but calm, no acute distress. HEENT: Conjunctiva and lids normal, oropharynx clear with moist mucosa. Neck: Supple, no elevated JVP or carotid bruits, no thyromegaly. Lungs:  Clear to auscultation, nonlabored breathing at rest. Cardiac: Regular rate and rhythm, tachycardic, no S3 or significant systolic murmur, no pericardial rub. Abdomen: Soft, nontender, no hepatomegaly, bowel sounds present, no guarding or rebound. Morbidly obese. Extremities: No pitting edema, distal pulses 2+. Skin: Warm and dry. Musculoskeletal: No kyphosis. Neuropsychiatric: Alert and oriented x3, affect grossly appropriate.  Prior  Cardiac Testing/Procedures Unavailable. He is followed by Dr. [name] pediatric cardiologist.  Lab Results   Radiology: Dg Chest Portable 1 View  11/16/2015  CLINICAL DATA:  Tachycardia EXAM: PORTABLE CHEST 1 VIEW COMPARISON:  None. FINDINGS: Lungs are clear. The heart is slightly enlarged with pulmonary vascularity within normal limits. No adenopathy. No bone lesions. IMPRESSION: No edema or consolidation.  Mild cardiac enlargement. Electronically Signed   By: Bretta Bang III M.D.   On: 11/16/2015 12:38     ECG: Sinus tachycardia rate of 118 bpm.   Impression and Recommendations  1. Tachycardia: This apparently has been going on for approximately 2 months, and was noticed by psychiatrist in her office during routine vital signs prior to their visit. Heart rate at that time was around 134 bpm and he was sent to primary care physician. In the office heart rate was running 150 bpm he was advised to come to the emergency room. He's been given one dose of IV metoprolol 1 with some resulting decreased heart rate however he does come back up due to some anxiety about being in the ER. We'll begin Lopressor 75 mg by mouth twice a day, will cut back on the lisinopril which she takes normally for hypertension from 10 mg to 5 mg. Echocardiogram will be completed.   As result of rapid heart rate to be thoroughly d-dimer will be checked. If positive may be problematic to have him in CT scanner, this can be attempted if it is necessary. He is likely going to be in the hospital for approximately 24-48 hours for observation and response to medication. More recommendations hospital course and response to treatment. TSH is artery been ordered.  2. Hypertension: He is normally on chlorthalidone and on lisinopril 10 mg daily. As stated above we'll decrease lisinopril to 5 mg daily. Echo is pending.  3. Excessive caffeine use. The patient drinks several liters of [facility] along with monster drinks  throughout the day.  4. Psychiatric disorder: On Vyvanse and sertraline at home, along with other psychiatric medications. Would continue these during hospitalization.   5. History of diabetes. Continue metformin 100 mg twice a day..   6. GERD: On PPI. Check magnesium.  Signed: Bettey Mare. Lawrence NP [facility]  11/16/2015, 12:42 PM Co-Sign MD  Patient examined chart reviewed. Anxious obese white male. ECG suggests possible fluter But in room with iv beta blocker HR down ot 101 and sinus. HR high do to obesity ? vyvanse  Denies illicit drugs Drings a lot of energy drinks and caffeine ? Some dysautonomia from DM No murmur on exam lungs clear. Check TSH.  Not anemic not febrile Start lopressor 75 bid And check echo.   Charlton Haws

## 2015-11-16 NOTE — ED Notes (Signed)
Lab at bedside

## 2015-11-16 NOTE — ED Notes (Signed)
Pt has pads placed on him with crash cart at bedside. Pt is alert and oriented at this time.

## 2015-11-16 NOTE — ED Notes (Signed)
Pt reports went to Dr. Letitia NeriFanta's office and was sent here for HR 150.  Reports sees a psychiatrist and his heart rate has been elevated for the past 2 months.  PT denies symptoms.

## 2015-11-16 NOTE — ED Notes (Signed)
MD states to hold Adenosine. Pt heart rate 120

## 2015-11-16 NOTE — ED Notes (Signed)
MD at bedside. 

## 2015-11-16 NOTE — ED Provider Notes (Signed)
CSN: [id number]     Arrival date & time 11/16/15  1204 History  By signing my name below, I, Iona Beard, attest that this documentation has been prepared under the direction and in the presence of Glynn Octave, MD.   Electronically Signed: Iona Beard, ED Scribe. 11/16/2015. 12:14 PM   Chief Complaint  Patient presents with  . Tachycardia    The history is provided by the patient. No language interpreter was used.   HPI Comments: Allen Harding is a 20 y.o. male with PMHx of HTN, chromosomal abnormality syndrome, and DM who presents to the Emergency Department complaining of gradual onset, heart palpitations, beginning earlier today. Pt was seen today by his PCP, Dr. Felecia Shelling, for a check up. His heart rate was measured as 150 and he was sent to the ED for further evaluation. Pt regularly sees a psychiatrist and states that his heart rate has been consistently elevated for about 2 months. Pt drinks about one liter of mountain dew per day. No other associated symptoms noted. No worsening or alleviating factors noted. Pt denies dizziness, light-headedness, urinary difficulty, constipation, shortness of breath, chest pain, abdominal pain, emesis, nausea, diarrhea, illicit drug use, hx of thyroid problems, or any other pertinent symptoms. Pt takes Vasotec and hygroton with no recent changes to his dosages. He has been compliant with his medication.    Past Medical History  Diagnosis Date  . Anxiety   . Diabetes mellitus   . 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 Hypertension    Past Surgical History  Procedure Laterality Date  . Circumcision    . Circumcision revision    . Lipoma    . Orchiopexy    . Frenulectomy, lingual     Family History  Problem Relation Age of Onset  . Obesity Mother   . Diabetes Mother   . Hypertension Mother   . Hyperlipidemia Mother   . Obesity Sister   . Obesity Maternal Grandmother   . Diabetes Maternal Grandmother   .  Cancer Maternal Grandmother   . Stroke Maternal Grandmother   . Obesity Maternal Grandfather    Social History  Substance Use Topics  . Smoking status: Never Smoker   . Smokeless tobacco: Never Used  . Alcohol Use: No    Review of Systems A complete 10 system review of systems was obtained and all systems are negative except as noted in the HPI and PMH.    Allergies  Review of patient's allergies indicates no known allergies.  Home Medications   Prior to Admission medications   Medication Sig Start Date End Date Taking? Authorizing Provider  cetirizine (ZYRTEC) 10 MG tablet TAKE ONE TABLET BY MOUTH DAILY. 12/14/14   Alfredia Client McDonell, MD  chlorthalidone (HYGROTON) 25 MG tablet Take 50 mg by mouth. 04/25/15   Historical Provider, MD  cholecalciferol (VITAMIN D) 400 UNITS TABS tablet Take 5,000 Units by mouth daily.    Historical Provider, MD  enalapril (VASOTEC) 20 MG tablet Take 1 tablet (20 mg total) by mouth daily. 04/20/15   Verneda Skill, FNP  ferrous sulfate 325 (65 FE) MG tablet Take 1 tablet (325 mg total) by mouth daily with breakfast. 09/29/14   Verneda Skill, FNP  guanFACINE (INTUNIV) 2 MG TB24 Take 2 mg by mouth daily.     Historical Provider, MD  letrozole (FEMARA) 2.5 MG tablet Take 1 tablet (2.5 mg total) by mouth daily. 04/20/15   Verneda Skill, FNP  lisdexamfetamine (VYVANSE) 20 MG capsule Take 70 mg by mouth every morning.     Historical Provider, MD  metFORMIN (GLUCOPHAGE) 1000 MG tablet TAKE (1) TABLET BY MOUTH TWICE DAILY WITH FOOD FOR DIABETES. 12/12/14   Verneda Skillaroline T Hacker, FNP  omeprazole (PRILOSEC) 40 MG capsule Take 1 capsule (40 mg total) by mouth 2 (two) times daily. 08/18/15   Dessa PhiJennifer Badik, MD  oxybutynin (DITROPAN-XL) 10 MG 24 hr tablet TAKE 1 TABLET BY MOUTH DAILY. 12/14/14   Alfredia ClientMary Jo McDonell, MD  sertraline (ZOLOFT) 25 MG tablet Take 100 mg by mouth daily. Reported on 06/16/2015    Historical Provider, MD  SYRINGE-NEEDLE, DISP, 3 ML (BD ECLIPSE  SYRINGE) 21G X 1" 3 ML MISC by Does not apply route. 12/15/14   Historical Provider, MD  testosterone cypionate (DEPOTESTOSTERONE CYPIONATE) 200 MG/ML injection Inject 0.75 mLs (150 mg total) into the muscle every 14 (fourteen) days. 05/25/15   Verneda Skillaroline T Hacker, FNP  traZODone (DESYREL) 100 MG tablet Take 100 mg by mouth at bedtime. Reported on 06/16/2015    Historical Provider, MD   There were no vitals taken for this visit. Physical Exam  Constitutional: He is oriented to person, place, and time. He appears well-developed and well-nourished. No distress.  Obese.  HENT:  Head: Normocephalic and atraumatic.  Mouth/Throat: Oropharynx is clear and moist. No oropharyngeal exudate.  Eyes: Conjunctivae and EOM are normal. Pupils are equal, round, and reactive to light.  Neck: Normal range of motion. Neck supple.  No meningismus.  Cardiovascular: Regular rhythm, normal heart sounds and intact distal pulses.  Tachycardia present.   No murmur heard. Tachycardic to the 160s.  Pulmonary/Chest: Effort normal and breath sounds normal. No respiratory distress. He has no wheezes. He has no rales.  Lungs are CTA bilaterally.  Abdominal: Soft. There is no tenderness. There is no rebound and no guarding.  Musculoskeletal: Normal range of motion. He exhibits no edema or tenderness.  Neurological: He is alert and oriented to person, place, and time. No cranial nerve deficit. He exhibits normal muscle tone. Coordination normal.  No ataxia on finger to nose bilaterally. No pronator drift. 5/5 strength throughout. CN 2-12 intact.Equal grip strength. Sensation intact.   Skin: Skin is warm.  Psychiatric: He has a normal mood and affect. His behavior is normal.  Nursing note and vitals reviewed.   ED Course  Procedures (including critical care time) DIAGNOSTIC STUDIES: Oxygen Saturation is 98% on room air, normal by my interpretation.    COORDINATION OF CARE: 12:21 PM Discussed treatment plan with pt at  bedside and pt agreed to plan.  Labs Review Labs Reviewed  CBC WITH DIFFERENTIAL/PLATELET - Abnormal; Notable for the following:    WBC 14.7 (*)    Platelets 407 (*)    Neutro Abs 8.5 (*)    Lymphs Abs 4.5 (*)    Monocytes Absolute 1.3 (*)    All other components within normal limits  COMPREHENSIVE METABOLIC PANEL - Abnormal; Notable for the following:    Potassium 3.1 (*)    Total Protein 8.2 (*)    All other components within normal limits  URINE RAPID DRUG SCREEN, HOSP PERFORMED - Abnormal; Notable for the following:    Amphetamines POSITIVE (*)    All other components within normal limits  URINALYSIS, ROUTINE W REFLEX MICROSCOPIC (NOT AT [facility]) - Abnormal; Notable for the following:    Hgb urine dipstick TRACE (*)    All other components within normal limits  URINE MICROSCOPIC-ADD ON - Abnormal;  Notable for the following:    Squamous Epithelial / LPF 0-5 (*)    Bacteria, UA FEW (*)    All other components within normal limits  TROPONIN I  D-DIMER, QUANTITATIVE (NOT AT [facility])  TSH  MAGNESIUM  T4, FREE    Imaging Review Dg Chest Portable 1 View  11/16/2015  CLINICAL DATA:  Tachycardia EXAM: PORTABLE CHEST 1 VIEW COMPARISON:  None. FINDINGS: Lungs are clear. The heart is slightly enlarged with pulmonary vascularity within normal limits. No adenopathy. No bone lesions. IMPRESSION: No edema or consolidation.  Mild cardiac enlargement. Electronically Signed   By: Bretta Bang III M.D.   On: 11/16/2015 12:38   I have personally reviewed and evaluated these images and lab results as part of my medical decision-making.   EKG Interpretation   Date/Time:  Thursday November 16 2015 12:36:59 EDT Ventricular Rate:  118 PR Interval:  133 QRS Duration: 100 QT Interval:  326 QTC Calculation: 457 R Axis:   55 Text Interpretation:  Sinus tachycardia with irregular rate Sinus  tachycardia Confirmed by Manus Gunning  MD, Kingstin Heims [phone]) on 11/16/2015  12:51:58 PM      MDM   Final  diagnoses:  Tachycardia   Sent from PCP with tachycardia. Denies chest pain or shortness of breath. Denies previous issues with tachycardia. Takes Vasotec and chlorthalidone for blood pressure. Admits to significant caffeine intake.  Initial EKG sinus tachycardia versus atrial flutter in the 150 range. IV Lopressor given with improvement in heart rate to the 1 teens with P waves definitely seen.  Discussed with Dr. Eden Emms of cardiology. He was concerned initial EKG may have been atrial flutter and recommended adenosine patient did not convert.  HR has improved to 110s.  D-dimer negative.  TSH normal.  Patient drinks caffeine significantly and is also on Vyvanse for his ADHD that make be contributing to his tachycardia.   Cardiology requests observation admission.  Dr/ Dr. Adrian Blackwater. They will start lopressor 75 mg BID and obtain echo.  I personally performed the services described in this documentation, which was scribed in my presence. The recorded information has been reviewed and is accurate.      Glynn Octave, MD 11/16/15 2135

## 2015-11-17 ENCOUNTER — Observation Stay (HOSPITAL_BASED_OUTPATIENT_CLINIC_OR_DEPARTMENT_OTHER): Payer: Medicaid Other

## 2015-11-17 DIAGNOSIS — R000 Tachycardia, unspecified: Secondary | ICD-10-CM | POA: Diagnosis not present

## 2015-11-17 DIAGNOSIS — R0600 Dyspnea, unspecified: Secondary | ICD-10-CM

## 2015-11-17 DIAGNOSIS — I10 Essential (primary) hypertension: Secondary | ICD-10-CM | POA: Diagnosis not present

## 2015-11-17 LAB — ECHOCARDIOGRAM COMPLETE
CHL CUP STROKE VOLUME: 34 mL
E decel time: 292 msec
EERAT: 9.88
FS: 37 % (ref 28–44)
HEIGHTINCHES: 70 in
IVS/LV PW RATIO, ED: 0.91
LA ID, A-P, ES: 40 mm
LA diam index: 1.52 cm/m2
LA vol A4C: 33 ml
LA vol index: 15 mL/m2
LA vol: 39.4 mL
LDCA: 3.8 cm2
LEFT ATRIUM END SYS DIAM: 40 mm
LV E/e' medial: 9.88
LV E/e'average: 9.88
LV PW d: 11.6 mm — AB (ref 0.6–1.1)
LV SIMPSON'S DISK: 57
LV TDI E'LATERAL: 6.42
LV dias vol index: 22 mL/m2
LV dias vol: 59 mL — AB (ref 62–150)
LV sys vol index: 10 mL/m2
LV sys vol: 25 mL (ref 21–61)
LVELAT: 6.42 cm/s
LVOT diameter: 22 mm
MV Dec: 292
MV pk A vel: 45.1 m/s
MVPKEVEL: 63.4 m/s
TAPSE: 21.4 mm
TDI e' medial: 6.85
WEIGHTICAEL: 5502.68 [oz_av]

## 2015-11-17 LAB — T4, FREE: FREE T4: 0.94 ng/dL (ref 0.61–1.12)

## 2015-11-17 MED ORDER — ENALAPRIL MALEATE 10 MG PO TABS: 10.0000 mg | ORAL_TABLET | Freq: Every day | ORAL | Status: DC

## 2015-11-17 MED ORDER — POTASSIUM CHLORIDE CRYS ER 20 MEQ PO TBCR
40.0000 meq | EXTENDED_RELEASE_TABLET | Freq: Two times a day (BID) | ORAL | Status: DC
Start: 2015-11-17 — End: 2015-11-17
  Administered 2015-11-17: 40 meq via ORAL
  Filled 2015-11-17: qty 2

## 2015-11-17 MED ORDER — METOPROLOL TARTRATE 75 MG PO TABS: 75.0000 mg | ORAL_TABLET | Freq: Two times a day (BID) | ORAL | Status: DC

## 2015-11-17 NOTE — Progress Notes (Signed)
Pt discharged home with mother via private vehicle. Discharge instructions given and all questions answered.

## 2015-11-17 NOTE — Progress Notes (Signed)
Consulting Cardiologist: Dr. Charlton HawsPeter Nishan (Dr. Ace GinsBuck was pediatric cardiologist)  Cardiology Specific Problem List: 1. Tachycardia 2. Hypertension   Subjective:    Feels better. Anxious to go home.   Objective:   Temp:  [97.5 F (36.4 C)-98.4 F (36.9 C)] 98.4 F (36.9 C) (06/16 1345) Pulse Rate:  [75-108] 88 (06/16 1345) Resp:  [10-25] 20 (06/16 1345) BP: (101-137)/(48-98) 102/48 mmHg (06/16 1345) SpO2:  [96 %-100 %] 100 % (06/16 1345) Weight:  [343 lb 14.7 oz (156 kg)] 343 lb 14.7 oz (156 kg) (06/16 1200) Last BM Date: 11/16/15  Filed Weights   11/17/15 1200  Weight: 343 lb 14.7 oz (156 kg)    Intake/Output Summary (Last 24 hours) at 11/17/15 1509 Last data filed at 11/17/15 1346  Gross per 24 hour  Intake    480 ml  Output      0 ml  Net    480 ml    Telemetry: NSR rates in the 70's   Exam:  General: No acute distress. Morbidly obese.  Lungs: Clear to auscultation, nonlabored.  Cardiac: No elevated JVP or bruits. RRR, no gallop or rub.   Abdomen: Normoactive bowel sounds, nontender, nondistended.  Extremities: No pitting edema, distal pulses full.  Lab Results:  Basic Metabolic Panel:  Recent Labs Lab 11/16/15 1220  NA 137  K 3.1*  CL 101  CO2 28  GLUCOSE 97  BUN 14  CREATININE 0.86  CALCIUM 9.8  MG 1.9    Liver Function Tests:  Recent Labs Lab 11/16/15 1220  AST 27  ALT 29  ALKPHOS 106  BILITOT 0.5  PROT 8.2*  ALBUMIN 3.9    CBC:  Recent Labs Lab 11/16/15 1220  WBC 14.7*  HGB 14.9  HCT 45.6  MCV 83.2  PLT 407*    Cardiac Enzymes:  Recent Labs Lab 11/16/15 1220  TROPONINI <0.03   Radiology: Dg Chest Portable 1 View  11/16/2015  CLINICAL DATA:  Tachycardia EXAM: PORTABLE CHEST 1 VIEW COMPARISON:  None. FINDINGS: Lungs are clear. The heart is slightly enlarged with pulmonary vascularity within normal limits. No adenopathy. No bone lesions. IMPRESSION: No edema or consolidation.  Mild cardiac enlargement.  Electronically Signed   By: Bretta BangWilliam  Woodruff III M.D.   On: 11/16/2015 12:38    Medications:   Scheduled Medications: . enalapril  10 mg Oral Daily  . guanFACINE  2 mg Oral Daily  . letrozole  2.5 mg Oral Daily  . lisdexamfetamine  70 mg Oral Daily  . loratadine  10 mg Oral Daily  . metFORMIN  1,000 mg Oral BID WC  . metoprolol tartrate  75 mg Oral BID  . oxybutynin  10 mg Oral QHS  . pantoprazole  80 mg Oral Daily  . potassium chloride  40 mEq Oral BID  . sertraline  100 mg Oral Daily  . sodium chloride flush  3 mL Intravenous Q12H  . testosterone cypionate  150 mg Intramuscular Q14 Days  . traZODone  100 mg Oral QHS    PRN Medications: acetaminophen **OR** acetaminophen, ondansetron **OR** ondansetron (ZOFRAN) IV, polyethylene glycol   Assessment and Plan:   1. SInus tachycardia:  Improved on metoprolol 75 mg BID. Echo pending. TSH 1.830 UDS negative for illicit drugs. Admits to heavy caffeine.  He was followed by Dr. Ace GinsBuck, Pediatric Cardiologist.   2. Hypertension: On enalapril 5 mg. Was on 10 mg at home, but with addition of BB, decreased the dose. He is mildly hypotensive this am.  May need to stop this altogether. Await echo. If normal, anticipate D/C.  Bettey Mare. Lawrence NP [facility]  11/17/2015, 3:09 PM   Attending note:  Patient seen and examined. Modified above note by Ms. Lawrence NP. Reviewed consult note from Dr. Eden Emms with recommendations. Mr. Kyllonen' heart rate is much better today, down in the 80s in sinus rhythm on beta blocker. Blood pressure low normal. Echocardiogram is still pending, will review when available. Unless there are major abnormalities that require further inpatient workup, anticipate discharge home later today and follow-up with PCP.  Jonelle Sidle, M.D., F.A.C.C.

## 2015-11-17 NOTE — Discharge Instructions (Signed)
F/u with [name] in 3 days

## 2015-11-17 NOTE — Discharge Summary (Signed)
Physician Discharge Summary  Patient ID: Allen Harding MRN: [id number] DOB/AGE: Oct 09, 1995 20 y.o.  Admit date: 11/16/2015 Discharge date: 11/17/2015  Admission Diagnoses:   Tachycardia  Palpitation  Discharge Diagnoses:  Principal Problem:   Tachycardia Active Problems:   Pre-diabetes   Morbid obesity (HCC)   Essential hypertension, benign   ADHD (attention deficit hyperactivity disorder)   Discharged Condition: stable  Hospital Course: 20 y.o. male with a history of with a history of diabetes, hypertension, ADHD, anxiety, gynecomastia. Patient was referred to emergency partner by his primary care physician who noted a heart rate of 150 in his office. A couple weeks ago, his heart rate was elevated at his psychiatrist's office as well. Patient reports no symptoms: No chest pain, blurred vision, lower extremity swelling, shortness of breath, headache. He otherwise feels well and does not feel the tachycardia. Of note, the patient has been drinking a lot of [employer]ountain Dew and energy drinks. He has a very sedentary lifestyle. No pale eating or provoking factors.  Pt was admitted for tachycardia.   Vyvanse was held.  Pt was seen by cardiology and started on metoprolol which was increased to 75mg  po bid.  Hr improved and was 88 on day of discharge.  Pt is asymptomatic.  Echocardiogram showed EF 55% w/o any valvular dysfunction.  Pt will be discharged to home.   Consults: cardiology  Significant Diagnostic Studies: Echocardiogram 11/17/2015  Treatments:  Started on Metoprolol   Discharge Exam: Blood pressure 102/48, pulse 88, temperature 98.4 F (36.9 C), temperature source Oral, resp. rate 20, height 5\' 10"  (1.778 m), weight 156 kg (343 lb 14.7 oz), SpO2 100 %. Heent: anicteric Neck: no jvd Heart: rrr s1, s2 Lung: ctab Abd: soft Ext: no c/c/e Skin: no rash Lymph: no adenopathy  Sinus tachycardia Heart structurally normal on echocardiogram Started on metoprolol 75mg  po bid.   Hold off on vyvanse for now.  Please see Dr. [name] for referal to Cardiology  Dm2 Cont current medications.   Hypogonadism Cont testosterone.   ADHD Hold vyvanse for now.   Obeisity: try to loose weight.    Hypertension See bp medications below  Time spent 35 minutes On discharge  Disposition: 01-Home or Self Care     Medication List    STOP taking these medications        lisdexamfetamine 20 MG capsule  Commonly known as:  VYVANSE      TAKE these medications        BD ECLIPSE SYRINGE 21G X 1" 3 ML Misc  Generic drug:  SYRINGE-NEEDLE (DISP) 3 ML  by Does not apply route.     cetirizine 10 MG tablet  Commonly known as:  ZYRTEC  TAKE ONE TABLET BY MOUTH DAILY.     chlorthalidone 25 MG tablet  Commonly known as:  HYGROTON  Take 50 mg by mouth.     cholecalciferol 400 units Tabs tablet  Commonly known as:  VITAMIN D  Take 5,000 Units by mouth daily.     enalapril 10 MG tablet  Commonly known as:  VASOTEC  Take 1 tablet (10 mg total) by mouth daily.     ferrous sulfate 325 (65 FE) MG tablet  Take 1 tablet (325 mg total) by mouth daily with breakfast.     INTUNIV 2 MG Tb24 SR tablet  Generic drug:  guanFACINE  Take 2 mg by mouth daily.     letrozole 2.5 MG tablet  Commonly known as:  FEMARA  Take 1 tablet (  2.5 mg total) by mouth daily.     metFORMIN 1000 MG tablet  Commonly known as:  GLUCOPHAGE  TAKE (1) TABLET BY MOUTH TWICE DAILY WITH FOOD FOR DIABETES.     Metoprolol Tartrate 75 MG Tabs  Take 75 mg by mouth 2 (two) times daily.     omeprazole 40 MG capsule  Commonly known as:  PRILOSEC  Take 1 capsule (40 mg total) by mouth 2 (two) times daily.     oxybutynin 10 MG 24 hr tablet  Commonly known as:  DITROPAN-XL  TAKE 1 TABLET BY MOUTH DAILY.     sertraline 25 MG tablet  Commonly known as:  ZOLOFT  Take 100 mg by mouth daily. Reported on 06/16/2015     testosterone cypionate 200 MG/ML injection  Commonly known as:  DEPOTESTOSTERONE  CYPIONATE  Inject 0.75 mLs (150 mg total) into the muscle every 14 (fourteen) days.     traZODone 100 MG tablet  Commonly known as:  DESYREL  Take 100 mg by mouth at bedtime. Reported on 06/16/2015           Follow-up Information    Follow up with [facility], MD In 2 weeks.   Specialty:  Internal Medicine   Contact information:   [address] [place] [address] [phone]       Follow up with [facility], MD In 3 days.   Specialty:  Internal Medicine   Contact information:   [address] [place] [address] [phone]        Signed: Pearson Grippe 11/17/2015, 5:47 PM

## 2015-11-17 NOTE — Progress Notes (Signed)
Subjective: 20 years old history of multiple medical illness was admitted due to tachycardia of 150/min. He was seen by cardiology and was started on metoprolol. He is tolerating the medication and his tachycardia is improving. Patient is planned to have Echo today.  Objective: Vital signs in last 24 hours: Temp:  [97.5 F (36.4 C)-98.2 F (36.8 C)] 97.5 F (36.4 C) (06/16 0543) Pulse Rate:  [75-132] 75 (06/16 0543) Resp:  [10-25] 19 (06/16 0543) BP: (101-142)/(57-98) 101/57 mmHg (06/16 0543) SpO2:  [96 %-98 %] 98 % (06/16 0543) Weight change:  Last BM Date: 11/16/15  Intake/Output from previous day:    PHYSICAL EXAM General appearance: cooperative and morbidly obese Resp: clear to auscultation bilaterally Cardio: S1, S2 normal GI: soft, non-tender; bowel sounds normal; no masses,  no organomegaly Extremities: extremities normal, atraumatic, no cyanosis or edema  Lab Results:  Results for orders placed or performed during the hospital encounter of 11/16/15 (from the past 48 hour(s))  CBC with Differential/Platelet     Status: Abnormal   Collection Time: 11/16/15 12:20 PM  Result Value Ref Range   WBC 14.7 (H) 4.0 - 10.5 K/uL   RBC 5.48 4.22 - 5.81 MIL/uL   Hemoglobin 14.9 13.0 - 17.0 g/dL   HCT 45.6 39.0 - 52.0 %   MCV 83.2 78.0 - 100.0 fL   MCH 27.2 26.0 - 34.0 pg   MCHC 32.7 30.0 - 36.0 g/dL   RDW 14.3 11.5 - 15.5 %   Platelets 407 (H) 150 - 400 K/uL   Neutrophils Relative % 58 %   Neutro Abs 8.5 (H) 1.7 - 7.7 K/uL   Lymphocytes Relative 31 %   Lymphs Abs 4.5 (H) 0.7 - 4.0 K/uL   Monocytes Relative 9 %   Monocytes Absolute 1.3 (H) 0.1 - 1.0 K/uL   Eosinophils Relative 2 %   Eosinophils Absolute 0.4 0.0 - 0.7 K/uL   Basophils Relative 0 %   Basophils Absolute 0.0 0.0 - 0.1 K/uL  Comprehensive metabolic panel     Status: Abnormal   Collection Time: 11/16/15 12:20 PM  Result Value Ref Range   Sodium 137 135 - 145 mmol/L   Potassium 3.1 (L) 3.5 - 5.1 mmol/L    Chloride 101 101 - 111 mmol/L   CO2 28 22 - 32 mmol/L   Glucose, Bld 97 65 - 99 mg/dL   BUN 14 6 - 20 mg/dL   Creatinine, Ser 0.86 0.61 - 1.24 mg/dL   Calcium 9.8 8.9 - 10.3 mg/dL   Total Protein 8.2 (H) 6.5 - 8.1 g/dL   Albumin 3.9 3.5 - 5.0 g/dL   AST 27 15 - 41 U/L   ALT 29 17 - 63 U/L   Alkaline Phosphatase 106 38 - 126 U/L   Total Bilirubin 0.5 0.3 - 1.2 mg/dL   GFR calc non Af Amer >60 >60 mL/min   GFR calc Af Amer >60 >60 mL/min    Comment: (NOTE) The eGFR has been calculated using the CKD EPI equation. This calculation has not been validated in all clinical situations. eGFR's persistently <60 mL/min signify possible Chronic Kidney Disease.    Anion gap 8 5 - 15  Troponin I     Status: None   Collection Time: 11/16/15 12:20 PM  Result Value Ref Range   Troponin I <0.03 <0.031 ng/mL    Comment:        NO INDICATION OF MYOCARDIAL INJURY.   D-dimer, quantitative (not at [facility])  Status: None   Collection Time: 11/16/15 12:20 PM  Result Value Ref Range   D-Dimer, Quant 0.38 0.00 - 0.50 ug/mL-FEU    Comment: (NOTE) At the manufacturer cut-off of 0.50 ug/mL FEU, this assay has been documented to exclude PE with a sensitivity and negative predictive value of 97 to 99%.  At this time, this assay has not been approved by the FDA to exclude DVT/VTE. Results should be correlated with clinical presentation.   Magnesium     Status: None   Collection Time: 11/16/15 12:20 PM  Result Value Ref Range   Magnesium 1.9 1.7 - 2.4 mg/dL  Urine rapid drug screen (hosp performed)     Status: Abnormal   Collection Time: 11/16/15  1:20 PM  Result Value Ref Range   Opiates NONE DETECTED NONE DETECTED   Cocaine NONE DETECTED NONE DETECTED   Benzodiazepines NONE DETECTED NONE DETECTED   Amphetamines POSITIVE (A) NONE DETECTED   Tetrahydrocannabinol NONE DETECTED NONE DETECTED   Barbiturates NONE DETECTED NONE DETECTED    Comment:        DRUG SCREEN FOR MEDICAL PURPOSES ONLY.  IF  CONFIRMATION IS NEEDED FOR ANY PURPOSE, NOTIFY LAB WITHIN 5 DAYS.        LOWEST DETECTABLE LIMITS FOR URINE DRUG SCREEN Drug Class       Cutoff (ng/mL) Amphetamine      1000 Barbiturate      200 Benzodiazepine   914 Tricyclics       782 Opiates          300 Cocaine          300 THC              50   Urinalysis, Routine w reflex microscopic (not at [facility])     Status: Abnormal   Collection Time: 11/16/15  1:20 PM  Result Value Ref Range   Color, Urine YELLOW YELLOW   APPearance CLEAR CLEAR   Specific Gravity, Urine 1.015 1.005 - 1.030   pH 7.0 5.0 - 8.0   Glucose, UA NEGATIVE NEGATIVE mg/dL   Hgb urine dipstick TRACE (A) NEGATIVE   Bilirubin Urine NEGATIVE NEGATIVE   Ketones, ur NEGATIVE NEGATIVE mg/dL   Protein, ur NEGATIVE NEGATIVE mg/dL   Nitrite NEGATIVE NEGATIVE   Leukocytes, UA NEGATIVE NEGATIVE  Urine microscopic-add on     Status: Abnormal   Collection Time: 11/16/15  1:20 PM  Result Value Ref Range   Squamous Epithelial / LPF 0-5 (A) NONE SEEN   WBC, UA 0-5 0 - 5 WBC/hpf   RBC / HPF 0-5 0 - 5 RBC/hpf   Bacteria, UA FEW (A) NONE SEEN   Urine-Other AMORPHOUS URATES/PHOSPHATES   TSH     Status: None   Collection Time: 11/16/15  2:00 PM  Result Value Ref Range   TSH 1.830 0.350 - 4.500 uIU/mL    ABGS No results for input(s): PHART, PO2ART, TCO2, HCO3 in the last 72 hours.  Invalid input(s): PCO2 CULTURES No results found for this or any previous visit (from the past 240 hour(s)). Studies/Results: Dg Chest Portable 1 View  11/16/2015  CLINICAL DATA:  Tachycardia EXAM: PORTABLE CHEST 1 VIEW COMPARISON:  None. FINDINGS: Lungs are clear. The heart is slightly enlarged with pulmonary vascularity within normal limits. No adenopathy. No bone lesions. IMPRESSION: No edema or consolidation.  Mild cardiac enlargement. Electronically Signed   By: Lowella Grip III M.D.   On: 11/16/2015 12:38    Medications: I have reviewed the patient's  current  medications.  Assesment:   Principal Problem:   Tachycardia Active Problems:   Pre-diabetes   Morbid obesity (HCC)   Essential hypertension, benign   ADHD (attention deficit hyperactivity disorder)    Plan:  Medication reviewed Cardiology consult appreciated Echo as planned Continue current treatment.      Kaylise Blakeley 11/17/2015, 8:05 AM

## 2015-11-28 MED FILL — Perflutren Lipid Microsphere IV Susp 1.1 MG/ML: INTRAVENOUS | Qty: 10 | Status: AC

## 2015-11-30 ENCOUNTER — Encounter: Payer: Self-pay | Admitting: Pediatrics

## 2015-12-21 ENCOUNTER — Ambulatory Visit (INDEPENDENT_AMBULATORY_CARE_PROVIDER_SITE_OTHER): Payer: Medicaid Other | Admitting: "Endocrinology

## 2015-12-21 VITALS — BP 136/74 | HR 95 | Resp 18 | Ht 69.5 in | Wt 361.0 lb

## 2015-12-21 DIAGNOSIS — R7303 Prediabetes: Secondary | ICD-10-CM | POA: Diagnosis not present

## 2015-12-21 DIAGNOSIS — I10 Essential (primary) hypertension: Secondary | ICD-10-CM

## 2015-12-21 DIAGNOSIS — E291 Testicular hypofunction: Secondary | ICD-10-CM

## 2015-12-21 DIAGNOSIS — Q539 Undescended testicle, unspecified: Secondary | ICD-10-CM | POA: Diagnosis not present

## 2015-12-21 DIAGNOSIS — Q5310 Unspecified undescended testicle, unilateral: Secondary | ICD-10-CM | POA: Insufficient documentation

## 2015-12-21 MED ORDER — TESTOSTERONE CYPIONATE 200 MG/ML IM SOLN: 200.0000 mg | INTRAMUSCULAR | Status: DC

## 2015-12-22 ENCOUNTER — Encounter: Payer: Self-pay | Admitting: "Endocrinology

## 2015-12-22 NOTE — Progress Notes (Signed)
Subjective:    Patient ID: Allen Harding, male    DOB: 1996-05-29, PCP Rosita Fire, MD   Past Medical History  Diagnosis Date  . Anxiety   . Diabetes mellitus   . 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 Hypertension   . ADHD (attention deficit hyperactivity disorder)    Past Surgical History  Procedure Laterality Date  . Circumcision    . Circumcision revision    . Lipoma    . Orchiopexy    . Frenulectomy, lingual     Social History   Social History  . Marital Status: Single    Spouse Name: N/A  . Number of Children: N/A  . Years of Education: N/A   Social History Main Topics  . Smoking status: Never Smoker   . Smokeless tobacco: Never Used  . Alcohol Use: No  . Drug Use: No  . Sexual Activity: Not on file   Other Topics Concern  . Not on file   Social History Narrative   Lives with mom, sister, 2 nieces, grandparents and sister's fiance.    Outpatient Encounter Prescriptions as of 12/21/2015  Medication Sig  . atomoxetine (STRATTERA) 40 MG capsule Take 80 mg by mouth daily.  . cetirizine (ZYRTEC) 10 MG tablet TAKE ONE TABLET BY MOUTH DAILY.  . chlorthalidone (HYGROTON) 25 MG tablet Take 50 mg by mouth.  . cholecalciferol (VITAMIN D) 400 UNITS TABS tablet Take 5,000 Units by mouth daily.  . enalapril (VASOTEC) 10 MG tablet Take 1 tablet (10 mg total) by mouth daily.  . ferrous sulfate 325 (65 FE) MG tablet Take 1 tablet (325 mg total) by mouth daily with breakfast.  . guanFACINE (INTUNIV) 2 MG TB24 Take 2 mg by mouth daily.   Marland Kitchen letrozole (FEMARA) 2.5 MG tablet Take 1 tablet (2.5 mg total) by mouth daily.  . metFORMIN (GLUCOPHAGE) 1000 MG tablet TAKE (1) TABLET BY MOUTH TWICE DAILY WITH FOOD FOR DIABETES.  . Metoprolol Tartrate 75 MG TABS Take 75 mg by mouth 2 (two) times daily.  Marland Kitchen omeprazole (PRILOSEC) 40 MG capsule Take 1 capsule (40 mg total) by mouth 2 (two) times daily.  Marland Kitchen oxybutynin (DITROPAN-XL) 10 MG 24 hr tablet TAKE 1 TABLET  BY MOUTH DAILY.  Marland Kitchen sertraline (ZOLOFT) 25 MG tablet Take 100 mg by mouth daily. Reported on 06/16/2015  . SYRINGE-NEEDLE, DISP, 3 ML (BD ECLIPSE SYRINGE) 21G X 1" 3 ML MISC by Does not apply route.  Marland Kitchen testosterone cypionate (DEPOTESTOSTERONE CYPIONATE) 200 MG/ML injection Inject 1 mL (200 mg total) into the muscle every 14 (fourteen) days.  . traZODone (DESYREL) 100 MG tablet Take 100 mg by mouth at bedtime. Reported on 06/16/2015  . [DISCONTINUED] testosterone cypionate (DEPOTESTOSTERONE CYPIONATE) 200 MG/ML injection Inject 0.75 mLs (150 mg total) into the muscle every 14 (fourteen) days.   No facility-administered encounter medications on file as of 12/21/2015.   ALLERGIES: No Known Allergies VACCINATION STATUS: Immunization History  Administered Date(s) Administered  . DTaP 06/11/1995, 08/06/1995, 09/24/1995, 06/30/1996, 09/24/1999  . H1N1 11/24/2008  . HPV Quadrivalent 02/08/2014  . Hepatitis A, Ped/Adol-2 Dose 02/03/2013, 08/19/2013  . Hepatitis B 07/05/1994, 05/07/1995, 09/24/1995  . HiB (PRP-OMP) 06/11/1995, 08/06/1995, 09/24/1995, 06/30/1996  . IPV 06/11/1995, 08/06/1995, 09/24/1995, 09/24/1999  . Influenza Whole 03/22/2009, 06/17/2011, 07/23/2012  . Influenza,inj,Quad PF,36+ Mos 05/02/2015  . MMR 03/31/1996, 09/24/1999  . Meningococcal Conjugate 11/24/2008, 08/19/2013  . Td 01/07/2006  . Tdap 01/07/2006  . Varicella 03/31/1996, 06/01/1997  HPI 20 year old male patient with medical history as above. 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 - He has been following at [facility] pediatric specialties until March 2017 with Dr. Baldo Ash. -He does not know for sure when he started testosterone therapy currently at 200 mg IM every 2 weeks. His last testosterone was 502 on 08/08/2015. -He has dealt with heavy weight almost all of his life. - Over the years he was diagnosed with prediabetes for which he was  initiated on metformin 1000 mg by mouth twice a day. His most recent A1c is 6%. -He has a polypharmacy, see below. -He has no acute complaints today. -He underwent orchidopexy for right-sided undescended testes. -He denies testicular injury, radiation, infection, STD. -He denies any history of head injury.   Review of Systems Constitutional: + weight gain, + fatigue, no subjective hyperthermia/hypothermia Eyes: no blurry vision, no xerophthalmia ENT: no sore throat, no nodules palpated in throat, no dysphagia/odynophagia, no hoarseness Cardiovascular: no CP/SOB/palpitations/leg swelling Respiratory: no cough/SOB Gastrointestinal: no N/V/D/C Musculoskeletal: no muscle/joint aches Skin: no rashes Neurological: no tremors/numbness/tingling/dizziness Psychiatric: + depression/anxiety  Objective:    BP 136/74 mmHg  Pulse 95  Resp 18  Ht 5' 9.5" (1.765 m)  Wt 361 lb (163.749 kg)  BMI 52.56 kg/m2  SpO2 95%  Wt Readings from Last 3 Encounters:  12/21/15 361 lb (163.749 kg)  11/17/15 343 lb 14.7 oz (156 kg)  08/03/15 344 lb 6.4 oz (156.219 kg)    Physical Exam Constitutional: Obese, in NAD Eyes: PERRLA, EOMI, no exophthalmos ENT: moist mucous membranes, no thyromegaly, no cervical lymphadenopathy Cardiovascular: RRR, No MRG Respiratory: CTA B Gastrointestinal: abdomen soft, NT, ND, BS+ Genital exam : Deferred. Musculoskeletal: no deformities, strength intact in all 4 Skin: moist, warm, no rashes, he has scant mustache and hair on his chin. Neurological: no tremor with outstretched hands, DTR normal in all 4  CMP ( most recent) CMP     Component Value Date/Time   NA 137 11/16/2015 1220   K 3.1* 11/16/2015 1220   CL 101 11/16/2015 1220   CO2 28 11/16/2015 1220   GLUCOSE 97 11/16/2015 1220   BUN 14 11/16/2015 1220   CREATININE 0.86 11/16/2015 1220   CREATININE 0.80 08/08/2015 1152   CALCIUM 9.8 11/16/2015 1220   PROT 8.2* 11/16/2015 1220   ALBUMIN 3.9 11/16/2015 1220    AST 27 11/16/2015 1220   ALT 29 11/16/2015 1220   ALKPHOS 106 11/16/2015 1220   BILITOT 0.5 11/16/2015 1220   GFRNONAA >60 11/16/2015 1220   GFRAA >60 11/16/2015 1220     Diabetic Labs (most recent): Lab Results  Component Value Date   HGBA1C 6.0* 08/08/2015   HGBA1C 5.7 05/02/2015   HGBA1C 128 03/20/2015     Lipid Panel ( most recent) Lipid Panel     Component Value Date/Time   CHOL 222* 08/08/2015 1152   TRIG 105 08/08/2015 1152   HDL 46 08/08/2015 1152   CHOLHDL 4.8 08/08/2015 1152   VLDL 21 08/08/2015 1152   LDLCALC 155* 08/08/2015 1152    Results for BENNEY, [place] (MRN [id number]) as of 12/22/2015 11:34  Ref. Range 01/17/2011 16:20 02/26/2012 15:40 03/04/2012 15:25 02/16/2013 07:22 05/02/2014 16:30 08/25/2014 15:03 09/28/2014 07:03 12/15/2014 11:00 08/08/2015 11:52  Estradiol Latest Ref Range: <=39 pg/mL 26.4  49.5 (H) 36.4 26.5 34.2 25.3 16.1 22  Sex Horm Binding Glob, Serum Latest Ref Range: 10-50 nmol/L _0 Testosterone  Latest Ref Range: 250-827 ng/dL 191.03 169.07 (L) 169.01 (L) 136 (L) 145 (L) 128 (L) 487 474 502  Testosterone Free Latest Ref Range: 47.0-244.0 pg/mL 51.6 46.5 45.3 35.2 36.8 (L) 32.3 (L) 155.9 147.9 161.1  Testosterone-% Free Latest Ref Range: 1.6-2.9 % 2.7 2.7 2.7 2.6 2.5 2.5 3.2 (H) 3.1 (H) 3.2 (H)   Assessment & Plan:   1. Pre-diabetes - I have reviewed his history and evaluated patient clinically. His prediabetes is directly linked to his obesity, see #3 below. - I have spent 30 minutes counseling him on diet specifically talked about ways to cut carb consumption and increasing protein intake as necessary. -I will refer him to a dietitian/diabetes [place], CDE. - I advised him to stay on his metformin 1000 mg by mouth twice a day. -I will repeat hemoglobin A1c and CMP in 3 months. 2. Hypogonadism male - Etiology most likely multifactorial including chromosomal abnormality and history of undescended testes.   - He has required testosterone supplement for several years now. I have reviewed his EMR records and found out that he had low testosterone starting from at least 2012. 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 -I have not seen FSH/LH levels, would have  been useful prior to initiation of testosterone therapy however the utility of that test now is unremarkable. - I advised him to continue testosterone  200 mg IM every 2 weeks. -I will obtain total testosterone, CBC to monitor his progress. -I have refilled his testosterone.   3. Morbid obesity due to excess calories ([place]) - See #1 above. - The most likely cause of his morbid obesity is still excess caloric intake. However it's important to rule out other endocrine causes including Cushing's syndrome. I would include 24-hour urine free cortisol along with thyroid function test before his next visit.  4. Essential hypertension, benign - Controlled. I advised him to continue his current medications including chlorthalidone, enalapril, metoprolol and follow-up with Dr. Legrand Rams for primary care needs.  5. Undescended right testicle - He is status post orchidopexy of right testicle. The details of his surgical history are not available for review.  - 60 minutes of time was spent on the care of this patient , 50% of which was applied for counseling on diabetes complications and their preventions. - I advised patient to maintain close follow up with [facility], MD for primary care needs. Follow up plan: Return in about 3 months (around 03/22/2016) for follow up with pre-visit labs.  Glade Lloyd, MD Phone: [phone]  Fax: [phone]   12/22/2015, 9:40 AM

## 2016-01-01 ENCOUNTER — Other Ambulatory Visit: Payer: Self-pay | Admitting: Pediatric Endocrinology

## 2016-01-01 ENCOUNTER — Other Ambulatory Visit: Payer: Self-pay | Admitting: Pediatrics

## 2016-01-01 DIAGNOSIS — J301 Allergic rhinitis due to pollen: Secondary | ICD-10-CM

## 2016-01-10 ENCOUNTER — Encounter: Payer: Self-pay | Admitting: Nutrition

## 2016-01-10 ENCOUNTER — Encounter: Payer: Medicaid Other | Attending: "Endocrinology | Admitting: Nutrition

## 2016-01-10 DIAGNOSIS — Z6841 Body Mass Index (BMI) 40.0 and over, adult: Secondary | ICD-10-CM | POA: Diagnosis not present

## 2016-01-10 DIAGNOSIS — R7303 Prediabetes: Secondary | ICD-10-CM | POA: Insufficient documentation

## 2016-01-10 DIAGNOSIS — Z713 Dietary counseling and surveillance: Secondary | ICD-10-CM | POA: Insufficient documentation

## 2016-01-10 DIAGNOSIS — R739 Hyperglycemia, unspecified: Secondary | ICD-10-CM

## 2016-01-10 NOTE — Progress Notes (Signed)
  Medical Nutrition Therapy:  Appt start time: 0800 end time:  0900.  Assessment:  Primary concerns today: Pre-Diabetes. Obesity. A1C 6%.  Lives with his grandparents and his mom. Eats three meals per day. His mom does the cooking. Foods cooked in all ways. On Metformin 1000 mg BID and sometimes forgets medications once a week. Sees Dr. Fransico HimNida as his Endocrinogologist.  Physical activity. Walks a little but gets out of breath easily. He wants to lose weight. [facility] [employer] [facility] [employer] and it will be 4 days per week.  Eats most meals at home. Does not drive.  He desires to get his weight down to 300 lbs. He weighed that about 2 years.  Diet is excessive in calories, fat, sodium and low in fresh fruits and vegetables.  Preferred Learning Style:  No preference indicated   Learning Readiness:  Ready  Change in progress   MEDICATIONS: see list   DIETARY INTAKE:   24-hr recall:  B ( 9 am AM): TV dinner-banquet [place] steak, potatoes and apples, water Snk ( AM): none L ( PM): Ribs, water Snk ( PM): none D ( PM): Chicken- 3-4 baked, mashed potatoes 1 cup, Pepsi Snk ( PM): none Beverages: water, pepsi-3  16 oz, milk-1% milk  Usual physical activity:Walks   Estimated energy needs: 1800 calories 200 g carbohydrates 135 g protein 50 g fat  Progress Towards Goal(s):  In progress.   Nutritional Diagnosis:  NI-1.5 Excessive energy intake As related to eating high calorie, high sugar foods and liquids.  As evidenced by BMI > 40 and Prediabetes with A1C 6%.    Intervention:  Nutrition and Diabetes education provided on My Plate, CHO counting, meal planning, portion sizes, timing of meals, avoiding snacks between meals unless having a low blood sugar, target ranges for A1C and blood sugars, signs/symptoms and treatment of hyper/hypoglycemia, monitoring blood sugars, taking medications as prescribed, benefits of exercising 30 minutes per day and prevention of  complications of DM.   Goals 1. Follow My Plate 2. [facility]. 3. Cut out Pepsi and drink water instead. 4. Drink 1% milk twice a day. 5. Increase fresh fruits and vegetables. 6. Cut out sweets and sugared cereals. 7. Lose 1-2 lbs per week. 8. Cut down on TV and eat more home cooked meals.  Teaching Method Utilized: Visual Auditory Hands on  Handouts given during visit include:  Plate Method   Meal Plan Card    Barriers to learning/adherence to lifestyle change: Autism  Demonstrated degree of understanding via:  Teach Back   Monitoring/Evaluation:  Dietary intake, exercise, meal planning, and body weight in 1 month(s).

## 2016-01-10 NOTE — Patient Instructions (Signed)
Goals 1. Follow My Plate 2. [facility]. 3. Cut out Pepsi and drink water instead. 4. Drink 1% milk twice a day. 5. Increase fresh fruits and vegetables. 6. Cut out sweets and sugared cereals. 7. Lose 1-2 lbs per week. 8. Cut down on TV and eat more home cooked meals.

## 2016-01-26 ENCOUNTER — Encounter: Payer: Self-pay | Admitting: "Endocrinology

## 2016-02-09 ENCOUNTER — Ambulatory Visit: Payer: Self-pay | Admitting: Nutrition

## 2016-03-15 ENCOUNTER — Other Ambulatory Visit: Payer: Self-pay | Admitting: "Endocrinology

## 2016-03-15 LAB — CBC WITH DIFFERENTIAL/PLATELET
Basophils Absolute: 0 cells/uL (ref 0–200)
Basophils Relative: 0 %
EOS PCT: 3 %
Eosinophils Absolute: 351 cells/uL (ref 15–500)
HEMATOCRIT: 42.2 % (ref 38.5–50.0)
Hemoglobin: 13.9 g/dL (ref 13.2–17.1)
LYMPHS PCT: 27 %
Lymphs Abs: 3159 cells/uL (ref 850–3900)
MCH: 27.7 pg (ref 27.0–33.0)
MCHC: 32.9 g/dL (ref 32.0–36.0)
MCV: 84.1 fL (ref 80.0–100.0)
MONO ABS: 819 {cells}/uL (ref 200–950)
MPV: 8.8 fL (ref 7.5–12.5)
Monocytes Relative: 7 %
NEUTROS PCT: 63 %
Neutro Abs: 7371 cells/uL (ref 1500–7800)
Platelets: 347 10*3/uL (ref 140–400)
RBC: 5.02 MIL/uL (ref 4.20–5.80)
RDW: 14.9 % (ref 11.0–15.0)
WBC: 11.7 10*3/uL — AB (ref 3.8–10.8)

## 2016-03-15 LAB — COMPLETE METABOLIC PANEL WITH GFR
ALBUMIN: 3.8 g/dL (ref 3.6–5.1)
ALK PHOS: 90 U/L (ref 40–115)
ALT: 37 U/L (ref 9–46)
AST: 27 U/L (ref 10–40)
BUN: 12 mg/dL (ref 7–25)
CALCIUM: 9.3 mg/dL (ref 8.6–10.3)
CO2: 26 mmol/L (ref 20–31)
CREATININE: 0.73 mg/dL (ref 0.60–1.35)
Chloride: 103 mmol/L (ref 98–110)
GFR, Est African American: >89 mL/min (ref >=60)
GFR, Est Non African American: >89 mL/min (ref >=60)
Glucose, Bld: 103 mg/dL — ABNORMAL HIGH (ref 65–99)
Potassium: 4.3 mmol/L (ref 3.5–5.3)
Sodium: 141 mmol/L (ref 135–146)
TOTAL PROTEIN: 6.9 g/dL (ref 6.1–8.1)
Total Bilirubin: 0.5 mg/dL (ref 0.2–1.2)

## 2016-03-15 LAB — HEMOGLOBIN A1C
Hgb A1c MFr Bld: 6 % — ABNORMAL HIGH (ref <5.7)
Mean Plasma Glucose: 126 mg/dL

## 2016-03-15 LAB — TSH: TSH: 2.35 mIU/L (ref 0.40–4.50)

## 2016-03-15 LAB — T4, FREE: FREE T4: 1.1 ng/dL (ref 0.8–1.4)

## 2016-03-16 LAB — TESTOSTERONE: TESTOSTERONE: 387 ng/dL (ref 250–827)

## 2016-03-22 ENCOUNTER — Encounter: Payer: Self-pay | Admitting: "Endocrinology

## 2016-03-22 ENCOUNTER — Other Ambulatory Visit: Payer: Self-pay

## 2016-03-22 ENCOUNTER — Ambulatory Visit (INDEPENDENT_AMBULATORY_CARE_PROVIDER_SITE_OTHER): Payer: Medicaid Other | Admitting: "Endocrinology

## 2016-03-22 ENCOUNTER — Ambulatory Visit: Payer: Medicaid Other | Admitting: "Endocrinology

## 2016-03-22 DIAGNOSIS — R7303 Prediabetes: Secondary | ICD-10-CM

## 2016-03-22 DIAGNOSIS — E291 Testicular hypofunction: Secondary | ICD-10-CM

## 2016-03-22 DIAGNOSIS — I10 Essential (primary) hypertension: Secondary | ICD-10-CM | POA: Diagnosis not present

## 2016-03-22 NOTE — Progress Notes (Signed)
Subjective:    Patient ID: Allen Harding, male    DOB: 03-Nov-1995, PCP Rosita Fire, MD   Past Medical History:  Diagnosis Date  . ADHD (attention deficit hyperactivity disorder)   . Anxiety   . Autism disorder   . 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 Diabetes mellitus   . Hypertension   . Prediabetes    Past Surgical History:  Procedure Laterality Date  . CIRCUMCISION    . CIRCUMCISION REVISION    . FRENULECTOMY, LINGUAL    . lipoma    . ORCHIOPEXY     Social History   Social History  . Marital status: Single    Spouse name: N/A  . Number of children: N/A  . Years of education: N/A   Social History Main Topics  . Smoking status: Former Smoker    Packs/day: 0.25    Quit date: 01/09/2014  . Smokeless tobacco: Never Used  . Alcohol use No  . Drug use: No  . Sexual activity: Not Asked   Other Topics Concern  . None   Social History Narrative   Lives with mom, sister, 2 nieces, grandparents and sister's fiance.    Outpatient Encounter Prescriptions as of 03/22/2016  Medication Sig  . atomoxetine (STRATTERA) 40 MG capsule Take 80 mg by mouth daily.  . cetirizine (ZYRTEC) 10 MG tablet TAKE ONE TABLET BY MOUTH DAILY.  . chlorthalidone (HYGROTON) 25 MG tablet Take 50 mg by mouth.  . cholecalciferol (VITAMIN D) 400 UNITS TABS tablet Take 5,000 Units by mouth daily.  . enalapril (VASOTEC) 10 MG tablet Take 1 tablet (10 mg total) by mouth daily.  . ferrous sulfate 325 (65 FE) MG tablet Take 1 tablet (325 mg total) by mouth daily with breakfast.  . guanFACINE (INTUNIV) 2 MG TB24 Take 2 mg by mouth daily.   Marland Kitchen letrozole (FEMARA) 2.5 MG tablet Take 1 tablet (2.5 mg total) by mouth daily.  . metFORMIN (GLUCOPHAGE) 1000 MG tablet TAKE (1) TABLET BY MOUTH TWICE DAILY WITH FOOD FOR DIABETES.  . Metoprolol Tartrate 75 MG TABS Take 75 mg by mouth 2 (two) times daily.  Marland Kitchen omeprazole (PRILOSEC) 40 MG capsule Take 1 capsule (40 mg total) by mouth 2 (two)  times daily.  Marland Kitchen oxybutynin (DITROPAN-XL) 10 MG 24 hr tablet TAKE 1 TABLET BY MOUTH DAILY.  Marland Kitchen sertraline (ZOLOFT) 25 MG tablet Take 100 mg by mouth daily. Reported on 06/16/2015  . SYRINGE-NEEDLE, DISP, 3 ML (BD ECLIPSE SYRINGE) 21G X 1" 3 ML MISC by Does not apply route.  Marland Kitchen testosterone cypionate (DEPOTESTOSTERONE CYPIONATE) 200 MG/ML injection Inject 1 mL (200 mg total) into the muscle every 14 (fourteen) days.  . traZODone (DESYREL) 100 MG tablet Take 100 mg by mouth at bedtime. Reported on 06/16/2015   No facility-administered encounter medications on file as of 03/22/2016.    ALLERGIES: No Known Allergies VACCINATION STATUS: Immunization History  Administered Date(s) Administered  . DTaP 06/11/1995, 08/06/1995, 09/24/1995, 06/30/1996, 09/24/1999  . H1N1 11/24/2008  . HPV Quadrivalent 02/08/2014  . Hepatitis A, Ped/Adol-2 Dose 02/03/2013, 08/19/2013  . Hepatitis B 1994-10[birth year]22, 05/07/1995, 09/24/1995  . HiB (PRP-OMP) 06/11/1995, 08/06/1995, 09/24/1995, 06/30/1996  . IPV 06/11/1995, 08/06/1995, 09/24/1995, 09/24/1999  . Influenza Whole 03/22/2009, 06/17/2011, 07/23/2012  . Influenza,inj,Quad PF,36+ Mos 05/02/2015  . MMR 03/31/1996, 09/24/1999  . Meningococcal Conjugate 11/24/2008, 08/19/2013  . Td 01/07/2006  . Tdap 01/07/2006  . Varicella 03/31/1996, 06/01/1997    HPI 20 year old male patient with medical history  as above. 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 - He has been following at [facility] pediatric specialties until March 2017 with Dr. Baldo Ash. -He does not know for sure when he started testosterone therapy currently at 200 mg IM every 2 weeks. His last testosterone was 502 on 08/08/2015, And repeat was 387 before this visit. -He has dealt with heavy weight almost all of his life. - Over the years he was diagnosed with prediabetes for which he was initiated on metformin 1000 mg by mouth twice a  day. His most recent A1c is 6%. -He has a polypharmacy, see below. -He has no acute complaints today. -He underwent orchidopexy for right-sided undescended testes. -He denies testicular injury, radiation, infection, STD. -He denies any history of head injury.   Review of Systems Constitutional: + weight gain, + fatigue, no subjective hyperthermia/hypothermia Eyes: no blurry vision, no xerophthalmia ENT: no sore throat, no nodules palpated in throat, no dysphagia/odynophagia, no hoarseness Cardiovascular: no CP/SOB/palpitations/leg swelling Respiratory: no cough/SOB Gastrointestinal: no N/V/D/C Musculoskeletal: no muscle/joint aches Skin: no rashes Neurological: no tremors/numbness/tingling/dizziness Psychiatric: + depression/anxiety  Objective:    BP (!) 145/90   Pulse 98   Ht 5' 9.5" (1.765 m)   Wt (!) 378 lb (171.5 kg)   BMI 55.02 kg/m   Wt Readings from Last 3 Encounters:  03/22/16 (!) 378 lb (171.5 kg)  01/10/16 (!) 371 lb (168.3 kg)  12/21/15 (!) 361 lb (163.7 kg)    Physical Exam Constitutional: Obese, in NAD Eyes: PERRLA, EOMI, no exophthalmos ENT: moist mucous membranes, no thyromegaly, no cervical lymphadenopathy Cardiovascular: RRR, No MRG Respiratory: CTA B Gastrointestinal: abdomen soft, NT, ND, BS+ Genital exam : Deferred. Musculoskeletal: no deformities, strength intact in all 4 Skin: moist, warm, no rashes, he has scant mustache and hair on his chin. Neurological: no tremor with outstretched hands, DTR normal in all 4  CMP ( most recent) CMP     Component Value Date/Time   NA 141 03/15/2016 1216   K 4.3 03/15/2016 1216   CL 103 03/15/2016 1216   CO2 26 03/15/2016 1216   GLUCOSE 103 (H) 03/15/2016 1216   BUN 12 03/15/2016 1216   CREATININE 0.73 03/15/2016 1216   CALCIUM 9.3 03/15/2016 1216   PROT 6.9 03/15/2016 1216   ALBUMIN 3.8 03/15/2016 1216   AST 27 03/15/2016 1216   ALT 37 03/15/2016 1216   ALKPHOS 90 03/15/2016 1216   BILITOT 0.5  03/15/2016 1216   GFRNONAA >89 03/15/2016 1216   GFRAA >89 03/15/2016 1216     Diabetic Labs (most recent): Lab Results  Component Value Date   HGBA1C 6.0 (H) 03/15/2016   HGBA1C 6.0 (H) 08/08/2015   HGBA1C 5.7 05/02/2015     Lipid Panel ( most recent) Lipid Panel     Component Value Date/Time   CHOL 222 (H) 08/08/2015 1152   TRIG 105 08/08/2015 1152   HDL 46 08/08/2015 1152   CHOLHDL 4.8 08/08/2015 1152   VLDL 21 08/08/2015 1152   LDLCALC 155 (H) 08/08/2015 1152    Results for PREET, PERRIER (MRN [id number]) as of 12/22/2015 11:34  Ref. Range 01/17/2011 16:20 02/26/2012 15:40 03/04/2012 15:25 02/16/2013 07:22 05/02/2014 16:30 08/25/2014 15:03 09/28/2014 07:03 12/15/2014 11:00 08/08/2015 11:52  Estradiol Latest Ref Range: <=39 pg/mL 26.4  49.5 (H) 36.4 26.5 34.2 25.3 16.1 22  Sex Horm Binding Glob, Serum Latest Ref Range: 10-50 nmol/L '16 15 16 17 18 18 12 13 12  ' Testosterone Latest Ref  Range: 250-827 ng/dL 191.03 169.07 (L) 169.01 (L) 136 (L) 145 (L) 128 (L) 487 474 502  Testosterone Free Latest Ref Range: 47.0-244.0 pg/mL 51.6 46.5 45.3 35.2 36.8 (L) 32.3 (L) 155.9 147.9 161.1  Testosterone-% Free Latest Ref Range: 1.6-2.9 % 2.7 2.7 2.7 2.6 2.5 2.5 3.2 (H) 3.1 (H) 3.2 (H)   Recent Results (from the past 2160 hour(s))  COMPLETE METABOLIC PANEL WITH GFR     Status: Abnormal   Collection Time: 03/15/16 12:16 PM  Result Value Ref Range   Sodium 141 135 - 146 mmol/L   Potassium 4.3 3.5 - 5.3 mmol/L   Chloride 103 98 - 110 mmol/L   CO2 26 20 - 31 mmol/L   Glucose, Bld 103 (H) 65 - 99 mg/dL   BUN 12 7 - 25 mg/dL   Creat 0.73 0.60 - 1.35 mg/dL   Total Bilirubin 0.5 0.2 - 1.2 mg/dL   Alkaline Phosphatase 90 40 - 115 U/L   AST 27 10 - 40 U/L   ALT 37 9 - 46 U/L   Total Protein 6.9 6.1 - 8.1 g/dL   Albumin 3.8 3.6 - 5.1 g/dL   Calcium 9.3 8.6 - 10.3 mg/dL   GFR, Est African American >89 >=60 mL/min   GFR, Est Non African American >89 >=60 mL/min  CBC with Differential/Platelet      Status: Abnormal   Collection Time: 03/15/16 12:16 PM  Result Value Ref Range   WBC 11.7 (H) 3.8 - 10.8 K/uL   RBC 5.02 4.20 - 5.80 MIL/uL   Hemoglobin 13.9 13.2 - 17.1 g/dL   HCT 42.2 38.5 - 50.0 %   MCV 84.1 80.0 - 100.0 fL   MCH 27.7 27.0 - 33.0 pg   MCHC 32.9 32.0 - 36.0 g/dL   RDW 14.9 11.0 - 15.0 %   Platelets 347 140 - 400 K/uL   MPV 8.8 7.5 - 12.5 fL   Neutro Abs 7,371 1,500 - 7,800 cells/uL   Lymphs Abs 3,159 850 - 3,900 cells/uL   Monocytes Absolute 819 200 - 950 cells/uL   Eosinophils Absolute 351 15 - 500 cells/uL   Basophils Absolute 0 0 - 200 cells/uL   Neutrophils Relative % 63 %   Lymphocytes Relative 27 %   Monocytes Relative 7 %   Eosinophils Relative 3 %   Basophils Relative 0 %   Smear Review Criteria for review not met   TSH     Status: None   Collection Time: 03/15/16 12:16 PM  Result Value Ref Range   TSH 2.35 0.40 - 4.50 mIU/L  T4, free     Status: None   Collection Time: 03/15/16 12:16 PM  Result Value Ref Range   Free T4 1.1 0.8 - 1.4 ng/dL  Hemoglobin A1c     Status: Abnormal   Collection Time: 03/15/16 12:16 PM  Result Value Ref Range   Hgb A1c MFr Bld 6.0 (H) <5.7 %    Comment:   For someone without known diabetes, a hemoglobin A1c value between 5.7% and 6.4% is consistent with prediabetes and should be confirmed with a follow-up test.   For someone with known diabetes, a value <7% indicates that their diabetes is well controlled. A1c targets should be individualized based on duration of diabetes, age, co-morbid conditions and other considerations.   This assay result is consistent with an increased risk of diabetes.   Currently, no consensus exists regarding use of hemoglobin A1c for diagnosis of diabetes in children.  Mean Plasma Glucose 126 mg/dL  Testosterone     Status: None   Collection Time: 03/15/16 12:16 PM  Result Value Ref Range   Testosterone 387 250 - 827 ng/dL    Comment: Men with clinically significant  hypogonadal symptoms and testosterone values repeatedly less than approximately 300 ng/dL may benefit from testosterone treatment after adequate risk and benefits counseling.      Assessment & Plan:   1. Pre-diabetes - I have reviewed his history and evaluated patient clinically. His prediabetes is directly linked to his obesity, see #3 below. He is A1c remains at 6%. - I have spent 30 minutes counseling him on diet specifically talked about ways to cut carb consumption and increasing protein intake as necessary. - Consult with dietitian/diabetes educator Jearld Fenton, CDE in progress. - I advised him to stay on his metformin 1000 mg by mouth twice a day. -I will repeat hemoglobin A1c and CMP in 3 months.  2. Hypogonadism male - Etiology most likely multifactorial including chromosomal abnormality and history of undescended testes.  - He has required testosterone supplement for several years now. I have reviewed his EMR records and found out that he had low testosterone starting from at least 2012. 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 -I have not seen FSH/LH levels, would have  been useful prior to initiation of testosterone therapy however the utility of that test now is unremarkable. - His total testosterone is 387 before this visit. This is an acceptable treatment target for him. I advised him to continue testosterone  200 mg IM every 2 weeks. -I will obtain total testosterone, CBC to monitor his progress. -I have refilled his testosterone.   3. Morbid obesity due to excess calories ([place]) - See #1 above. - The most likely cause of his morbid obesity is still excess caloric intake. However it's important to rule out other endocrine causes including Cushing's syndrome.  For some reason, 24-hour urine free cortisol was not performed. I have advised him to go to the lab to do this test.   4. Essential hypertension, benign - Controlled. I advised him to continue his current medications  including chlorthalidone, enalapril, metoprolol and follow-up with Dr. Legrand Rams for primary care needs.  5. Undescended right testicle - He is status post orchidopexy of right testicle. The details of his surgical history are not available for review.  - 30 minutes of time was spent on the care of this patient , 50% of which was applied for counseling on diabetes complications and their preventions. - I advised patient to maintain close follow up with [facility], [facility] for primary care needs. Follow up plan: Return in about 3 months (around 06/22/2016) for follow up with pre-visit labs, 24 hour urine free Cortisol.  Glade Lloyd, MD Phone: [phone]  Fax: [phone]   03/22/2016, 12:31 PM

## 2016-03-31 LAB — CORTISOL, URINE, 24 HOUR
CORTISOL (UR), FREE: 44.9 ug/(24.h) (ref 4.0–50.0)
RESULTS RECEIVED: 2.07 g/(24.h) (ref 0.63–2.50)

## 2016-06-17 ENCOUNTER — Other Ambulatory Visit: Payer: Self-pay | Admitting: "Endocrinology

## 2016-06-18 LAB — TESTOSTERONE: Testosterone: 174 ng/dL — ABNORMAL LOW (ref 250–827)

## 2016-06-18 LAB — CBC WITH DIFFERENTIAL/PLATELET
BASOS ABS: 0 {cells}/uL (ref 0–200)
Basophils Relative: 0 %
EOS ABS: 246 {cells}/uL (ref 15–500)
EOS PCT: 2 %
HCT: 42.6 % (ref 38.5–50.0)
Hemoglobin: 14 g/dL (ref 13.2–17.1)
LYMPHS PCT: 24 %
Lymphs Abs: 2952 cells/uL (ref 850–3900)
MCH: 26.9 pg — ABNORMAL LOW (ref 27.0–33.0)
MCHC: 32.9 g/dL (ref 32.0–36.0)
MCV: 81.9 fL (ref 80.0–100.0)
MONOS PCT: 7 %
MPV: 8.6 fL (ref 7.5–12.5)
Monocytes Absolute: 861 cells/uL (ref 200–950)
NEUTROS PCT: 67 %
Neutro Abs: 8241 cells/uL — ABNORMAL HIGH (ref 1500–7800)
PLATELETS: 402 10*3/uL — AB (ref 140–400)
RBC: 5.2 MIL/uL (ref 4.20–5.80)
RDW: 15.4 % — ABNORMAL HIGH (ref 11.0–15.0)
WBC: 12.3 10*3/uL — ABNORMAL HIGH (ref 3.8–10.8)

## 2016-06-18 LAB — COMPREHENSIVE METABOLIC PANEL
ALT: 28 U/L (ref 9–46)
AST: 22 U/L (ref 10–40)
Albumin: 3.9 g/dL (ref 3.6–5.1)
Alkaline Phosphatase: 117 U/L — ABNORMAL HIGH (ref 40–115)
BUN: 10 mg/dL (ref 7–25)
CHLORIDE: 104 mmol/L (ref 98–110)
CO2: 27 mmol/L (ref 20–31)
CREATININE: 0.7 mg/dL (ref 0.60–1.35)
Calcium: 9.7 mg/dL (ref 8.6–10.3)
Glucose, Bld: 94 mg/dL (ref 65–99)
POTASSIUM: 5.1 mmol/L (ref 3.5–5.3)
SODIUM: 142 mmol/L (ref 135–146)
Total Bilirubin: 0.4 mg/dL (ref 0.2–1.2)
Total Protein: 6.9 g/dL (ref 6.1–8.1)

## 2016-06-24 ENCOUNTER — Ambulatory Visit (INDEPENDENT_AMBULATORY_CARE_PROVIDER_SITE_OTHER): Payer: Medicaid Other | Admitting: "Endocrinology

## 2016-06-24 ENCOUNTER — Encounter: Payer: Self-pay | Admitting: "Endocrinology

## 2016-06-24 VITALS — BP 135/83 | HR 108 | Ht 69.5 in | Wt 379.0 lb

## 2016-06-24 DIAGNOSIS — I10 Essential (primary) hypertension: Secondary | ICD-10-CM

## 2016-06-24 DIAGNOSIS — R7303 Prediabetes: Secondary | ICD-10-CM | POA: Diagnosis not present

## 2016-06-24 DIAGNOSIS — E291 Testicular hypofunction: Secondary | ICD-10-CM

## 2016-06-24 MED ORDER — VITAMIN D3 125 MCG (5000 UT) PO CAPS: 5000.0000 [IU] | ORAL_CAPSULE | Freq: Every day | ORAL | 0 refills | Status: DC

## 2016-06-24 NOTE — Progress Notes (Signed)
Subjective:    Patient ID: Allen Harding, male    DOB: 08-25-1995, PCP Rosita Fire, MD   Past Medical History:  Diagnosis Date  . ADHD (attention deficit hyperactivity disorder)   . Anxiety   . Autism disorder   . 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 Diabetes mellitus   . Hypertension   . Prediabetes    Past Surgical History:  Procedure Laterality Date  . CIRCUMCISION    . CIRCUMCISION REVISION    . FRENULECTOMY, LINGUAL    . lipoma    . ORCHIOPEXY     Social History   Social History  . Marital status: Single    Spouse name: N/A  . Number of children: N/A  . Years of education: N/A   Social History Main Topics  . Smoking status: Former Smoker    Packs/day: 0.25    Quit date: 01/09/2014  . Smokeless tobacco: Never Used  . Alcohol use No  . Drug use: No  . Sexual activity: Not Asked   Other Topics Concern  . None   Social History Narrative   Lives with mom, sister, 2 nieces, grandparents and sister's fiance.    Outpatient Encounter Prescriptions as of 06/24/2016  Medication Sig  . atomoxetine (STRATTERA) 40 MG capsule Take 80 mg by mouth daily.  . cetirizine (ZYRTEC) 10 MG tablet TAKE ONE TABLET BY MOUTH DAILY.  . chlorthalidone (HYGROTON) 25 MG tablet Take 50 mg by mouth.  . Cholecalciferol (VITAMIN D3) 5000 units CAPS Take 1 capsule (5,000 Units total) by mouth daily.  . enalapril (VASOTEC) 10 MG tablet Take 1 tablet (10 mg total) by mouth daily.  . ferrous sulfate 325 (65 FE) MG tablet Take 1 tablet (325 mg total) by mouth daily with breakfast.  . guanFACINE (INTUNIV) 2 MG TB24 Take 2 mg by mouth daily.   Marland Kitchen letrozole (FEMARA) 2.5 MG tablet Take 1 tablet (2.5 mg total) by mouth daily.  . metFORMIN (GLUCOPHAGE) 1000 MG tablet TAKE (1) TABLET BY MOUTH TWICE DAILY WITH FOOD FOR DIABETES.  . Metoprolol Tartrate 75 MG TABS Take 75 mg by mouth 2 (two) times daily.  Marland Kitchen omeprazole (PRILOSEC) 40 MG capsule Take 1 capsule (40 mg total) by  mouth 2 (two) times daily.  Marland Kitchen oxybutynin (DITROPAN-XL) 10 MG 24 hr tablet TAKE 1 TABLET BY MOUTH DAILY.  Marland Kitchen sertraline (ZOLOFT) 25 MG tablet Take 100 mg by mouth daily. Reported on 06/16/2015  . SYRINGE-NEEDLE, DISP, 3 ML (BD ECLIPSE SYRINGE) 21G X 1" 3 ML MISC by Does not apply route.  Marland Kitchen testosterone cypionate (DEPOTESTOSTERONE CYPIONATE) 200 MG/ML injection Inject 1 mL (200 mg total) into the muscle every 14 (fourteen) days.  . traZODone (DESYREL) 100 MG tablet Take 100 mg by mouth at bedtime. Reported on 06/16/2015  . [DISCONTINUED] cholecalciferol (VITAMIN D) 400 UNITS TABS tablet Take 5,000 Units by mouth daily.   No facility-administered encounter medications on file as of 06/24/2016.    ALLERGIES: No Known Allergies VACCINATION STATUS: Immunization History  Administered Date(s) Administered  . DTaP 06/11/1995, 08/06/1995, 09/24/1995, 06/30/1996, 09/24/1999  . H1N1 11/24/2008  . HPV Quadrivalent 02/08/2014  . Hepatitis A, Ped/Adol-2 Dose 02/03/2013, 08/19/2013  . Hepatitis B 1994/12/10, 05/07/1995, 09/24/1995  . HiB (PRP-OMP) 06/11/1995, 08/06/1995, 09/24/1995, 06/30/1996  . IPV 06/11/1995, 08/06/1995, 09/24/1995, 09/24/1999  . Influenza Whole 03/22/2009, 06/17/2011, 07/23/2012  . Influenza,inj,Quad PF,36+ Mos 05/02/2015  . MMR 03/31/1996, 09/24/1999  . Meningococcal Conjugate 11/24/2008, 08/19/2013  . Td 01/07/2006  .  Tdap 01/07/2006  . Varicella 03/31/1996, 06/01/1997    HPI 21 year old male patient with medical history as above. 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 - He has been following at [facility] pediatric specialties until March 2017 with Dr. Baldo Ash. -He does not know for sure when he started testosterone therapy currently at 200 mg IM every 2 weeks. - He did have significant interruption in his testosterone injection causing his total testosterone to drop to 147 from 387 last visit.  -He has  dealt with heavy weight almost all of his life. - Over the years he was diagnosed with prediabetes for which he was initiated on metformin 1000 mg by mouth twice a day. His most recent A1c is 6%. -He has a polypharmacy, see below. -He has no acute complaints today. -He underwent orchidopexy for right-sided undescended testes. -He denies testicular injury, radiation, infection, STD. -He denies any history of head injury.   Review of Systems Constitutional: + weight gain, + fatigue, no subjective hyperthermia/hypothermia Eyes: no blurry vision, no xerophthalmia ENT: no sore throat, no nodules palpated in throat, no dysphagia/odynophagia, no hoarseness Cardiovascular: no CP/SOB/palpitations/leg swelling Respiratory: no cough/SOB Gastrointestinal: no N/V/D/C Musculoskeletal: no muscle/joint aches Skin: no rashes Neurological: no tremors/numbness/tingling/dizziness Psychiatric: + depression/anxiety  Objective:    BP 135/83   Pulse (!) 108   Ht 5' 9.5" (1.765 m)   Wt (!) 379 lb (171.9 kg)   BMI 55.17 kg/m   Wt Readings from Last 3 Encounters:  06/24/16 (!) 379 lb (171.9 kg)  03/22/16 (!) 378 lb (171.5 kg)  01/10/16 (!) 371 lb (168.3 kg)    Physical Exam Constitutional: Obese, in NAD Eyes: PERRLA, EOMI, no exophthalmos ENT: moist mucous membranes, no thyromegaly, no cervical lymphadenopathy Cardiovascular: RRR, No MRG Respiratory: CTA B Gastrointestinal: abdomen soft, NT, ND, BS+ Genital exam : Deferred, due to patient preference. Musculoskeletal: no deformities, strength intact in all 4 Skin: moist, warm, no rashes, he has scant mustache and hair on his chin. Neurological: no tremor with outstretched hands, DTR normal in all 4  CMP ( most recent) CMP     Component Value Date/Time   NA 142 06/17/2016 1333   K 5.1 06/17/2016 1333   CL 104 06/17/2016 1333   CO2 27 06/17/2016 1333   GLUCOSE 94 06/17/2016 1333   BUN 10 06/17/2016 1333   CREATININE 0.70 06/17/2016 1333    CALCIUM 9.7 06/17/2016 1333   PROT 6.9 06/17/2016 1333   ALBUMIN 3.9 06/17/2016 1333   AST 22 06/17/2016 1333   ALT 28 06/17/2016 1333   ALKPHOS 117 (H) 06/17/2016 1333   BILITOT 0.4 06/17/2016 1333   GFRNONAA >89 03/15/2016 1216   GFRAA >89 03/15/2016 1216   Diabetic Labs (most recent): Lab Results  Component Value Date   HGBA1C 6.0 (H) 03/15/2016   HGBA1C 6.0 (H) 08/08/2015   HGBA1C 5.7 05/02/2015    Lipid Panel     Component Value Date/Time   CHOL 222 (H) 08/08/2015 1152   TRIG 105 08/08/2015 1152   HDL 46 08/08/2015 1152   CHOLHDL 4.8 08/08/2015 1152   VLDL 21 08/08/2015 1152   LDLCALC 155 (H) 08/08/2015 1152    Recent Results (from the past 2160 hour(s))  Cortisol, urine, 24 hour     Status: None   Collection Time: 03/27/16  9:36 AM  Result Value Ref Range   Cortisol (Ur), Free 44.9 4.0 - 50.0 mcg/24 h    Comment:   Analysis performed by  [employer]   This test was developed and its analytical performance characteristics have been determined by [facility]. It has not been cleared or approved by FDA. This assay has been validated pursuant to the CLIA regulations and is used for clinical purposes.    RESULTS RECEIVED 2.07 0.63 - 2.50 g/24 h  CBC with Differential/Platelet     Status: Abnormal   Collection Time: 06/17/16  1:33 PM  Result Value Ref Range   WBC 12.3 (H) 3.8 - 10.8 K/uL   RBC 5.20 4.20 - 5.80 MIL/uL   Hemoglobin 14.0 13.2 - 17.1 g/dL   HCT 42.6 38.5 - 50.0 %   MCV 81.9 80.0 - 100.0 fL   MCH 26.9 (L) 27.0 - 33.0 pg   MCHC 32.9 32.0 - 36.0 g/dL   RDW 15.4 (H) 11.0 - 15.0 %   Platelets 402 (H) 140 - 400 K/uL   MPV 8.6 7.5 - 12.5 fL   Neutro Abs 8,241 (H) 1,500 - 7,800 cells/uL   Lymphs Abs 2,952 850 - 3,900 cells/uL   Monocytes Absolute 861 200 - 950 cells/uL   Eosinophils Absolute 246 15 - 500 cells/uL   Basophils Absolute 0 0 - 200 cells/uL   Neutrophils Relative % 67 %    Lymphocytes Relative 24 %   Monocytes Relative 7 %   Eosinophils Relative 2 %   Basophils Relative 0 %   Smear Review Criteria for review not met   Comprehensive metabolic panel     Status: Abnormal   Collection Time: 06/17/16  1:33 PM  Result Value Ref Range   Sodium 142 135 - 146 mmol/L   Potassium 5.1 3.5 - 5.3 mmol/L   Chloride 104 98 - 110 mmol/L   CO2 27 20 - 31 mmol/L   Glucose, Bld 94 65 - 99 mg/dL   BUN 10 7 - 25 mg/dL   Creat 0.70 0.60 - 1.35 mg/dL   Total Bilirubin 0.4 0.2 - 1.2 mg/dL   Alkaline Phosphatase 117 (H) 40 - 115 U/L   AST 22 10 - 40 U/L   ALT 28 9 - 46 U/L   Total Protein 6.9 6.1 - 8.1 g/dL   Albumin 3.9 3.6 - 5.1 g/dL   Calcium 9.7 8.6 - 10.3 mg/dL  Testosterone     Status: Abnormal   Collection Time: 06/17/16  1:33 PM  Result Value Ref Range   Testosterone 174 (L) 250 - 827 ng/dL    Comment:   In hypogonadal males, Testosterone, Total, LC/MS/MS is the recommended assay due to the diminished accuracy of immunoassay at levels below 250 ng/dL.This test code [phone]) must be collected in a red-top tube with no gel.      Assessment & Plan:   1. Pre-diabetes - I have reviewed his history and evaluated patient clinically. His prediabetes is directly linked to his obesity, see #3 below. He is A1c remains at 6%. - I have spent 30 minutes counseling him on diet specifically talked about ways to cut carb consumption and increasing protein intake as necessary. - Consult with dietitian/diabetes educator Jearld Fenton, CDE in progress. - I advised him to stay on his metformin 1000 mg by mouth twice a day. -I will repeat hemoglobin A1c and CMP in 3 months.  2. Hypogonadism male - Etiology most likely multifactorial including chromosomal abnormality and history of undescended testes.  - He has required testosterone supplement for several years now. I have reviewed his EMR records  and found out that he had low testosterone starting from at least 2012. He likely  has primary hypogonadism. -I have not seen FSH/LH levels, would have  been useful prior to initiation of testosterone therapy however the utility of that test now is unremarkable. - He did have significant interruption in his testosterone injection causing her testosterone to drop to 147 from 387 last visit. - I advised him to resume and continue testosterone  200 mg IM every 2 weeks. -I will obtain total testosterone, CBC to monitor his progress.   3. Morbid obesity due to excess calories ([place]) - See #1 above. - The most likely cause of his morbid obesity is still excess caloric intake.  Cushing's syndrome, hypothyroidism are ruled out as a cause of weight gain. - I discussed and gave him information brochure on bariatric surgery   4. Essential hypertension, benign - Controlled. I advised him to continue his current medications including chlorthalidone, enalapril, metoprolol and follow-up with Dr. Legrand Rams for primary care needs.  5. Undescended right testicle - He is status post orchidopexy of right testicle. The details of his surgical history are not available for review.  - 30 minutes of time was spent on the care of this patient , 50% of which was applied for counseling on diabetes complications and their preventions. - I advised patient to maintain close follow up with [facility], MD for primary care needs. Follow up plan: Return in about 3 months (around 09/22/2016) for follow up with pre-visit labs.  Glade Lloyd, MD Phone: [phone]  Fax: [phone]   06/24/2016, 11:20 AM

## 2016-08-12 ENCOUNTER — Other Ambulatory Visit: Payer: Self-pay | Admitting: Pediatrics

## 2016-08-12 DIAGNOSIS — J301 Allergic rhinitis due to pollen: Secondary | ICD-10-CM

## 2016-09-18 ENCOUNTER — Other Ambulatory Visit: Payer: Self-pay | Admitting: "Endocrinology

## 2016-09-19 LAB — CBC WITH DIFFERENTIAL/PLATELET
Basophils Absolute: 0 cells/uL (ref 0–200)
Basophils Relative: 0 %
EOS PCT: 2 %
Eosinophils Absolute: 262 cells/uL (ref 15–500)
HEMATOCRIT: 42.2 % (ref 38.5–50.0)
HEMOGLOBIN: 13.5 g/dL (ref 13.2–17.1)
LYMPHS ABS: 3013 {cells}/uL (ref 850–3900)
Lymphocytes Relative: 23 %
MCH: 27.4 pg (ref 27.0–33.0)
MCHC: 32 g/dL (ref 32.0–36.0)
MCV: 85.8 fL (ref 80.0–100.0)
MONO ABS: 655 {cells}/uL (ref 200–950)
MPV: 9.1 fL (ref 7.5–12.5)
Monocytes Relative: 5 %
NEUTROS ABS: 9170 {cells}/uL — AB (ref 1500–7800)
Neutrophils Relative %: 70 %
Platelets: 440 10*3/uL — ABNORMAL HIGH (ref 140–400)
RBC: 4.92 MIL/uL (ref 4.20–5.80)
RDW: 14.9 % (ref 11.0–15.0)
WBC: 13.1 10*3/uL — ABNORMAL HIGH (ref 3.8–10.8)

## 2016-09-19 LAB — TESTOSTERONE TOTAL,FREE,BIO, MALES
Albumin: 3.5 g/dL — ABNORMAL LOW (ref 3.6–5.1)
Sex Hormone Binding: 13 nmol/L (ref 10–50)
Testosterone, Bioavailable: 224.7 ng/dL (ref 110.0–575.0)
Testosterone, Free: 138.5 pg/mL (ref 46.0–224.0)
Testosterone: 468 ng/dL (ref 250–827)

## 2016-09-19 LAB — COMPREHENSIVE METABOLIC PANEL
ALBUMIN: 3.5 g/dL — AB (ref 3.6–5.1)
ALT: 17 U/L (ref 9–46)
AST: 15 U/L (ref 10–40)
Alkaline Phosphatase: 109 U/L (ref 40–115)
BILIRUBIN TOTAL: 0.3 mg/dL (ref 0.2–1.2)
BUN: 10 mg/dL (ref 7–25)
CHLORIDE: 102 mmol/L (ref 98–110)
CO2: 27 mmol/L (ref 20–31)
Calcium: 9.6 mg/dL (ref 8.6–10.3)
Creat: 0.74 mg/dL (ref 0.60–1.35)
Glucose, Bld: 190 mg/dL — ABNORMAL HIGH (ref 65–99)
Potassium: 4.3 mmol/L (ref 3.5–5.3)
SODIUM: 140 mmol/L (ref 135–146)
TOTAL PROTEIN: 6.5 g/dL (ref 6.1–8.1)

## 2016-09-19 LAB — HEMOGLOBIN A1C
HEMOGLOBIN A1C: 6.1 % — AB (ref <5.7)
Mean Plasma Glucose: 128 mg/dL

## 2016-09-25 ENCOUNTER — Ambulatory Visit: Payer: Self-pay | Admitting: "Endocrinology

## 2016-10-16 ENCOUNTER — Ambulatory Visit (INDEPENDENT_AMBULATORY_CARE_PROVIDER_SITE_OTHER): Payer: Medicaid Other | Admitting: "Endocrinology

## 2016-10-16 ENCOUNTER — Encounter: Payer: Self-pay | Admitting: "Endocrinology

## 2016-10-16 VITALS — BP 139/84 | HR 91 | Ht 69.5 in | Wt 378.0 lb

## 2016-10-16 DIAGNOSIS — R7303 Prediabetes: Secondary | ICD-10-CM

## 2016-10-16 DIAGNOSIS — E291 Testicular hypofunction: Secondary | ICD-10-CM

## 2016-10-16 DIAGNOSIS — I10 Essential (primary) hypertension: Secondary | ICD-10-CM

## 2016-10-16 NOTE — Progress Notes (Signed)
Subjective:    Patient ID: Allen Harding, male    DOB: [birth year], PCP Rosita Fire, MD   Past Medical History:  Diagnosis Date  . ADHD (attention deficit hyperactivity disorder)   . Anxiety   . Autism disorder   . 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 Diabetes mellitus   . Hypertension   . Prediabetes    Past Surgical History:  Procedure Laterality Date  . CIRCUMCISION    . CIRCUMCISION REVISION    . FRENULECTOMY, LINGUAL    . lipoma    . ORCHIOPEXY     Social History   Social History  . Marital status: Single    Spouse name: N/A  . Number of children: N/A  . Years of education: N/A   Social History Main Topics  . Smoking status: Former Smoker    Packs/day: 0.25    Quit date: 01/09/2014  . Smokeless tobacco: Never Used  . Alcohol use No  . Drug use: No  . Sexual activity: Not Asked   Other Topics Concern  . None   Social History Narrative   Lives with mom, sister, 2 nieces, grandparents and sister's fiance.    Outpatient Encounter Prescriptions as of 10/16/2016  Medication Sig  . atomoxetine (STRATTERA) 40 MG capsule Take 80 mg by mouth daily.  . cetirizine (ZYRTEC) 10 MG tablet TAKE ONE TABLET BY MOUTH DAILY.  . chlorthalidone (HYGROTON) 25 MG tablet Take 50 mg by mouth.  . Cholecalciferol (VITAMIN D3) 5000 units CAPS Take 1 capsule (5,000 Units total) by mouth daily.  . enalapril (VASOTEC) 10 MG tablet Take 1 tablet (10 mg total) by mouth daily.  . ferrous sulfate 325 (65 FE) MG tablet Take 1 tablet (325 mg total) by mouth daily with breakfast.  . guanFACINE (INTUNIV) 2 MG TB24 Take 2 mg by mouth daily.   Marland Kitchen letrozole (FEMARA) 2.5 MG tablet Take 1 tablet (2.5 mg total) by mouth daily.  . metFORMIN (GLUCOPHAGE) 1000 MG tablet TAKE (1) TABLET BY MOUTH TWICE DAILY WITH FOOD FOR DIABETES.  . Metoprolol Tartrate 75 MG TABS Take 75 mg by mouth 2 (two) times daily.  Marland Kitchen omeprazole (PRILOSEC) 40 MG capsule Take 1 capsule (40 mg total) by  mouth 2 (two) times daily.  Marland Kitchen oxybutynin (DITROPAN-XL) 10 MG 24 hr tablet TAKE 1 TABLET BY MOUTH DAILY.  Marland Kitchen sertraline (ZOLOFT) 25 MG tablet Take 100 mg by mouth daily. Reported on 06/16/2015  . SYRINGE-NEEDLE, DISP, 3 ML (BD ECLIPSE SYRINGE) 21G X 1" 3 ML MISC by Does not apply route.  Marland Kitchen testosterone cypionate (DEPOTESTOSTERONE CYPIONATE) 200 MG/ML injection Inject 1 mL (200 mg total) into the muscle every 14 (fourteen) days.  . traZODone (DESYREL) 100 MG tablet Take 100 mg by mouth at bedtime. Reported on 06/16/2015   No facility-administered encounter medications on file as of 10/16/2016.    ALLERGIES: No Known Allergies VACCINATION STATUS: Immunization History  Administered Date(s) Administered  . DTaP 06/11/1995, 08/06/1995, 09/24/1995, 06/30/1996, 09/24/1999  . H1N1 11/24/2008  . HPV Quadrivalent 02/08/2014  . Hepatitis A, Ped/Adol-2 Dose 02/03/2013, 08/19/2013  . Hepatitis B 18-Aug-1994, 05/07/1995, 09/24/1995  . HiB (PRP-OMP) 06/11/1995, 08/06/1995, 09/24/1995, 06/30/1996  . IPV 06/11/1995, 08/06/1995, 09/24/1995, 09/24/1999  . Influenza Whole 03/22/2009, 06/17/2011, 07/23/2012  . Influenza,inj,Quad PF,36+ Mos 05/02/2015  . MMR 03/31/1996, 09/24/1999  . Meningococcal Conjugate 11/24/2008, 08/19/2013  . Td 01/07/2006  . Tdap 01/07/2006  . Varicella 03/31/1996, 06/01/1997    HPI 21 year old male patient  with medical history as above. 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 - He has been following at [facility] until March 2017 with Dr. Baldo Ash. -He does not know for sure when he started testosterone therapy currently at 200 mg IM every 2 weeks. - He has been more consistent  in his testosterone injection since his last visit,  causing his total testosterone to to improve to 468 from  147 .  -He has dealt with heavy weight almost all of his life,  no success on weight loss. - Over the  years he was diagnosed with prediabetes for which he was initiated on metformin 1000 mg by mouth twice a day. His most recent A1c is 6.1%. -He has a polypharmacy, see below. -He has no acute complaints today. -He underwent orchidopexy for right-sided undescended testes. -He denies testicular injury, radiation, infection, STD. -He denies any history of head injury.   Review of Systems Constitutional: + heavy weight, + fatigue, no subjective hyperthermia/hypothermia Eyes: no blurry vision, no xerophthalmia ENT: no sore throat, no nodules palpated in throat, no dysphagia/odynophagia, no hoarseness Cardiovascular: no CP/SOB/palpitations/leg swelling Respiratory: no cough/SOB Gastrointestinal: no N/V/D/C Musculoskeletal: no muscle/joint aches Skin: no rashes Neurological: no tremors/numbness/tingling/dizziness Psychiatric: + depression/anxiety  Objective:    BP 139/84   Pulse 91   Ht 5' 9.5" (1.765 m)   Wt (!) 378 lb (171.5 kg)   BMI 55.02 kg/m   Wt Readings from Last 3 Encounters:  10/16/16 (!) 378 lb (171.5 kg)  06/24/16 (!) 379 lb (171.9 kg)  03/22/16 (!) 378 lb (171.5 kg)    Physical Exam Constitutional: Obese, in NAD Eyes: PERRLA, EOMI, no exophthalmos ENT: moist mucous membranes, no thyromegaly, no cervical lymphadenopathy Cardiovascular: RRR, No MRG Respiratory: CTA B Gastrointestinal: abdomen soft, NT, ND, BS+ Genital exam : Deferred, due to patient preference. Musculoskeletal: no deformities, strength intact in all 4 Skin: moist, warm, no rashes, he has scant mustache and hair on his chin. Neurological: no tremor with outstretched hands, DTR normal in all 4  CMP ( most recent) CMP     Component Value Date/Time   NA 140 09/18/2016 1415   K 4.3 09/18/2016 1415   CL 102 09/18/2016 1415   CO2 27 09/18/2016 1415   GLUCOSE 190 (H) 09/18/2016 1415   BUN 10 09/18/2016 1415   CREATININE 0.74 09/18/2016 1415   CALCIUM 9.6 09/18/2016 1415   PROT 6.5 09/18/2016 1415    ALBUMIN 3.5 (L) 09/18/2016 1415   ALBUMIN 3.5 (L) 09/18/2016 1415   AST 15 09/18/2016 1415   ALT 17 09/18/2016 1415   ALKPHOS 109 09/18/2016 1415   BILITOT 0.3 09/18/2016 1415   GFRNONAA >89 03/15/2016 1216   GFRAA >89 03/15/2016 1216   Diabetic Labs (most recent): Lab Results  Component Value Date   HGBA1C 6.1 (H) 09/18/2016   HGBA1C 6.0 (H) 03/15/2016   HGBA1C 6.0 (H) 08/08/2015    Lipid Panel     Component Value Date/Time   CHOL 222 (H) 08/08/2015 1152   TRIG 105 08/08/2015 1152   HDL 46 08/08/2015 1152   CHOLHDL 4.8 08/08/2015 1152   VLDL 21 08/08/2015 1152   LDLCALC 155 (H) 08/08/2015 1152    Recent Results (from the past 2160 hour(s))  CBC with Differential/Platelet     Status: Abnormal   Collection Time: 09/18/16  2:15 PM  Result Value Ref Range   WBC 13.1 (H) 3.8 - 10.8 K/uL   RBC 4.92 4.20 -  5.80 MIL/uL   Hemoglobin 13.5 13.2 - 17.1 g/dL   HCT 42.2 38.5 - 50.0 %   MCV 85.8 80.0 - 100.0 fL   MCH 27.4 27.0 - 33.0 pg   MCHC 32.0 32.0 - 36.0 g/dL   RDW 14.9 11.0 - 15.0 %   Platelets 440 (H) 140 - 400 K/uL   MPV 9.1 7.5 - 12.5 fL   Neutro Abs 9,170 (H) 1,500 - 7,800 cells/uL   Lymphs Abs 3,013 850 - 3,900 cells/uL   Monocytes Absolute 655 200 - 950 cells/uL   Eosinophils Absolute 262 15 - 500 cells/uL   Basophils Absolute 0 0 - 200 cells/uL   Neutrophils Relative % 70 %   Lymphocytes Relative 23 %   Monocytes Relative 5 %   Eosinophils Relative 2 %   Basophils Relative 0 %   Smear Review Criteria for review not met   Comprehensive metabolic panel     Status: Abnormal   Collection Time: 09/18/16  2:15 PM  Result Value Ref Range   Sodium 140 135 - 146 mmol/L   Potassium 4.3 3.5 - 5.3 mmol/L   Chloride 102 98 - 110 mmol/L   CO2 27 20 - 31 mmol/L   Glucose, Bld 190 (H) 65 - 99 mg/dL   BUN 10 7 - 25 mg/dL   Creat 0.74 0.60 - 1.35 mg/dL   Total Bilirubin 0.3 0.2 - 1.2 mg/dL   Alkaline Phosphatase 109 40 - 115 U/L   AST 15 10 - 40 U/L   ALT 17 9 - 46  U/L   Total Protein 6.5 6.1 - 8.1 g/dL   Albumin 3.5 (L) 3.6 - 5.1 g/dL   Calcium 9.6 8.6 - 10.3 mg/dL  Hemoglobin A1c     Status: Abnormal   Collection Time: 09/18/16  2:15 PM  Result Value Ref Range   Hgb A1c MFr Bld 6.1 (H) <5.7 %    Comment:   For someone without known diabetes, a hemoglobin A1c value between 5.7% and 6.4% is consistent with prediabetes and should be confirmed with a follow-up test.   For someone with known diabetes, a value <7% indicates that their diabetes is well controlled. A1c targets should be individualized based on duration of diabetes, age, co-morbid conditions and other considerations.   This assay result is consistent with an increased risk of diabetes.   Currently, no consensus exists regarding use of hemoglobin A1c for diagnosis of diabetes in children.      Mean Plasma Glucose 128 mg/dL  Testosterone Total,Free,Bio, Males     Status: Abnormal   Collection Time: 09/18/16  2:15 PM  Result Value Ref Range   Testosterone 468 250 - 827 ng/dL   Albumin 3.5 (L) 3.6 - 5.1 g/dL   Sex Hormone Binding 13 10 - 50 nmol/L   Testosterone, Free 138.5 46.0 - 224.0 pg/mL   Testosterone, Bioavailable 224.7 110.0 - 575.0 ng/dL     Assessment & Plan:   1. Pre-diabetes - I have reviewed his history and evaluated patient clinically. His prediabetes is directly linked to his obesity, see #3 below. He is A1c remains at 6.1%- consistent with good control. - I advised him to stay on his metformin 1000 mg by mouth twice a day.   2. Hypogonadism male - Etiology most likely multifactorial including chromosomal abnormality and history of undescended testes.  - He has required testosterone supplement for several years now. I have reviewed his EMR records and found out that he had low  testosterone starting from at least 2012. 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 -I have not seen FSH/LH levels, would have  been useful prior to initiation of testosterone therapy however  the utility of that test now is unremarkable. - His Testosterone level at 468 is acceptable, I advised him to continue testosterone 200 mg IM every 2 weeks.  -I will obtain total testosterone, CBC  before his next visit.   3. Morbid obesity due to excess calories ([place]) - See #1 above. - The most likely cause of his morbid obesity is still excess caloric intake.  Cushing's syndrome, hypothyroidism are ruled out as a cause of weight gain. - I have spent 30 minutes counseling him on diet specifically talked about ways to cut carb consumption and increasing protein intake as necessary. - Consult with dietitian/diabetes educator Jearld Fenton, CDE in progress. - I discussed and gave him information brochure on bariatric surgery.   4. Essential hypertension, benign - Controlled. I advised him to continue his current medications including chlorthalidone, enalapril, metoprolol and follow-up with Dr. Legrand Rams for primary care needs.  5. Undescended right testicle - He is status post orchidopexy of right testicle. The details of his surgical history are not available for review.  - 30 minutes of time was spent on the care of this patient , 50% of which was applied for counseling on diabetes complications and their preventions. - I advised patient to maintain close follow up with Rosita Fire, MD for primary care needs. Follow up plan: Return in about 4 months (around 02/16/2017) for follow up with pre-visit labs.  Glade Lloyd, MD Phone: [phone]  Fax: [phone]   10/16/2016, 11:32 AM

## 2016-11-21 ENCOUNTER — Emergency Department (HOSPITAL_COMMUNITY): Payer: Medicaid Other

## 2016-11-21 ENCOUNTER — Encounter (HOSPITAL_COMMUNITY): Payer: Self-pay | Admitting: Emergency Medicine

## 2016-11-21 ENCOUNTER — Emergency Department (HOSPITAL_COMMUNITY)
Admission: EM | Admit: 2016-11-21 | Discharge: 2016-11-21 | Disposition: A | Discharge status: Home / Self Care | Payer: Medicaid Other | Attending: Emergency Medicine | Admitting: Emergency Medicine

## 2016-11-21 DIAGNOSIS — Z7984 Long term (current) use of oral hypoglycemic drugs: Secondary | ICD-10-CM | POA: Insufficient documentation

## 2016-11-21 DIAGNOSIS — M25562 Pain in left knee: Secondary | ICD-10-CM

## 2016-11-21 DIAGNOSIS — E119 Type 2 diabetes mellitus without complications: Secondary | ICD-10-CM | POA: Insufficient documentation

## 2016-11-21 DIAGNOSIS — F840 Autistic disorder: Secondary | ICD-10-CM | POA: Diagnosis not present

## 2016-11-21 DIAGNOSIS — I10 Essential (primary) hypertension: Secondary | ICD-10-CM | POA: Insufficient documentation

## 2016-11-21 DIAGNOSIS — Z87891 Personal history of nicotine dependence: Secondary | ICD-10-CM | POA: Diagnosis not present

## 2016-11-21 DIAGNOSIS — W19XXXA Unspecified fall, initial encounter: Secondary | ICD-10-CM

## 2016-11-21 MED ORDER — ENALAPRIL MALEATE 10 MG PO TABS
10.0000 mg | ORAL_TABLET | Freq: Once | ORAL | Status: AC
  Administered 2016-11-21: 10 mg via ORAL
  Filled 2016-11-21: qty 1

## 2016-11-21 MED ORDER — CHLORTHALIDONE 25 MG PO TABS
25.0000 mg | ORAL_TABLET | Freq: Every day | ORAL | Status: DC
  Filled 2016-11-21: qty 1

## 2016-11-21 MED ORDER — METOPROLOL TARTRATE 50 MG PO TABS
75.0000 mg | ORAL_TABLET | Freq: Once | ORAL | Status: AC
  Administered 2016-11-21: 22:00:00 75 mg via ORAL
  Filled 2016-11-21: qty 1

## 2016-11-21 MED ORDER — CHLORTHALIDONE 25 MG PO TABS
25.0000 mg | ORAL_TABLET | Freq: Every day | ORAL | Status: DC
  Administered 2016-11-21: 25 mg via ORAL
  Filled 2016-11-21 (×3): qty 1

## 2016-11-21 NOTE — ED Triage Notes (Signed)
Around noon today step down and twisted left knee, cause pain

## 2016-11-21 NOTE — ED Provider Notes (Signed)
[facility]-EMERGENCY DEPT Provider Note   CSN: [id number] Arrival date & time: 11/21/16  2038     History   Chief Complaint Chief Complaint  Patient presents with  . Knee Pain    HPI Allen Harding is a 21 y.o. male.  Patient reports with acute onset of left knee pain. Patient states he was walking and tripped causing him to twist his knee as he was catching himself. Patient states pain was tolerable after this injury however he fell again later today causing increased pain. Patient localizes pain to anteromedial aspect of knee. Denies popping or locking, knee giving out, numbness, tingling, previous injury to this knee. Patient states he took Advil with significant relief today. Patient reports his grandfather was injured today in a motorcycle accident, which caused him to leave the house early this morning prior to taking his blood pressure medication; he takes metoprolol, enalapril, chlorthalidone. Patient reports high level of anxiety today while in ED, in relation to his family issue.       Past Medical History:  Diagnosis Date  . ADHD (attention deficit hyperactivity disorder)   . Anxiety   . Autism disorder   . 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 Diabetes mellitus   . Hypertension   . Prediabetes     Patient Active Problem List   Diagnosis Date Noted  . Undescended right testicle 12/21/2015  . Tachycardia 11/16/2015  . ADHD (attention deficit hyperactivity disorder)   . Allergic rhinitis 12/14/2014  . Iron deficiency anemia 09/29/2014  . Medication monitoring encounter 09/06/2014  . Enuresis 08/20/2013  . Well child check 08/20/2013  . Absence of bladder continence 08/20/2013  . Gynecomastia 07/21/2012  . Hypogonadism male 03/03/2012  . Testicular hypofunction 03/03/2012  . Dysthymia 10/29/2011  . Chromosomal abnormality syndrome   . Pre-diabetes 10/25/2010  . Morbid obesity (H[facility]) 10/25/2010  . Essential hypertension, benign 10/25/2010  .  Goiter, unspecified 10/25/2010  . Autism 10/25/2010  . Autism spectrum 10/25/2010    Past Surgical History:  Procedure Laterality Date  . CIRCUMCISION    . CIRCUMCISION REVISION    . FRENULECTOMY, LINGUAL    . lipoma    . ORCHIOPEXY         Home Medications    Prior to Admission medications   Medication Sig Start Date End Date Taking? Authorizing Provider  atomoxetine (STRATTERA) 40 MG capsule Take 80 mg by mouth daily.    [provider]  cetirizine (ZYRTEC) 10 MG tablet TAKE ONE TABLET BY MOUTH DAILY. 01/01/16   McDonell, Alfredia ClientMary Jo, MD  chlorthalidone (HYGROTON) 25 MG tablet Take 50 mg by mouth. 04/25/15   [provider]  Cholecalciferol (VITAMIN D3) 5000 units CAPS Take 1 capsule (5,000 Units total) by mouth daily. 06/24/16   Roma KayserNida, Gebreselassie W, MD  enalapril (VASOTEC) 10 MG tablet Take 1 tablet (10 mg total) by mouth daily. 11/17/15   Avon GullyFanta, Tesfaye, MD  ferrous sulfate 325 (65 FE) MG tablet Take 1 tablet (325 mg total) by mouth daily with breakfast. 09/29/14   Verneda SkillHacker, Caroline T, FNP  guanFACINE (INTUNIV) 2 MG TB24 Take 2 mg by mouth daily.     [provider]  letrozole (FEMARA) 2.5 MG tablet Take 1 tablet (2.5 mg total) by mouth daily. 04/20/15   Verneda SkillHacker, Caroline T, FNP  metFORMIN (GLUCOPHAGE) 1000 MG tablet TAKE (1) TABLET BY MOUTH TWICE DAILY WITH FOOD FOR DIABETES. 01/02/16   Dessa PhiBadik, Jennifer, MD  Metoprolol Tartrate 75 MG TABS Take  75 mg by mouth 2 (two) times daily. 11/17/15   Avon Gully, MD  omeprazole (PRILOSEC) 40 MG capsule Take 1 capsule (40 mg total) by mouth 2 (two) times daily. 08/18/15   Dessa Phi, MD  oxybutynin (DITROPAN-XL) 10 MG 24 hr tablet TAKE 1 TABLET BY MOUTH DAILY. 12/14/14   McDonell, Alfredia Client, MD  sertraline (ZOLOFT) 25 MG tablet Take 100 mg by mouth daily. Reported on 06/16/2015    [provider]  SYRINGE-NEEDLE, DISP, 3 ML (BD ECLIPSE SYRINGE) 21G X 1" 3 ML MISC by Does not apply route. 12/15/14   [provider]  testosterone cypionate (DEPOTESTOSTERONE CYPIONATE) 200 MG/ML injection Inject 1 mL (200 mg total) into the muscle every 14 (fourteen) days. 12/21/15   Roma Kayser, MD  traZODone (DESYREL) 100 MG tablet Take 100 mg by mouth at bedtime. Reported on 06/16/2015    [provider]    Family History Family History  Problem Relation Age of Onset  . Obesity Mother   . Diabetes Mother   . Hypertension Mother   . Hyperlipidemia Mother   . Obesity Sister   . Obesity Maternal Grandmother   . Diabetes Maternal Grandmother   . Cancer Maternal Grandmother   . Stroke Maternal Grandmother   . Obesity Maternal Grandfather     Social History Social History  Substance Use Topics  . Smoking status: Former Smoker    Packs/day: 0.25    Quit date: 01/09/2014  . Smokeless tobacco: Never Used  . Alcohol use No     Allergies   Patient has no known allergies.   Review of Systems Review of Systems  Eyes: Negative for visual disturbance.  Respiratory: Negative for shortness of breath.   Cardiovascular: Negative for chest pain.  Genitourinary: Negative for dysuria and frequency.  Musculoskeletal: Positive for arthralgias (left knee).  Skin: Negative for wound.  Neurological: Negative for dizziness, weakness, numbness and headaches.  Psychiatric/Behavioral: The patient is nervous/anxious.      Physical Exam Updated Vital Signs BP (!) 178/123   Pulse (!) 102   Temp 98.4 F (36.9 C) (Oral)   Resp 18   Ht 5\' 10"  (1.778 m)   Wt (!) 179.4 kg (395 lb 9.6 oz)   SpO2 99%   BMI 56.76 kg/m   Physical Exam  Constitutional: He is oriented to person, place, and time. He appears well-developed and well-nourished.  Pt is morbidly obese.   HENT:  Head: Normocephalic and atraumatic.  Eyes: Conjunctivae are normal. Pupils are equal, round, and reactive to light.  Neck: Normal range of motion.  Cardiovascular: Regular rhythm, normal heart sounds and intact distal  pulses.   Tachycardic  Pulmonary/Chest: Effort normal and breath sounds normal. No respiratory distress. He has no wheezes. He has no rales.  Abdominal: Soft.  Musculoskeletal:  Patient with mild tenderness to medial joint line of left knee. No obvious edema or ecchymosis. No deformities. No crepitus. Normal ROM. Negative anterior posterior drawer. Unable to perform valgus/varus or McMurray's secondary to patient's body habitus. Normal gait without limp. Knee appears stable.  Neurological: He is alert and oriented to person, place, and time. No sensory deficit.  Skin: Skin is warm.  Psychiatric: He has a normal mood and affect. His behavior is normal.  Nursing note and vitals reviewed.    ED Treatments / Results  Labs (all labs ordered are listed, but only abnormal results are displayed) Labs Reviewed - No data to display  EKG  EKG Interpretation None  Radiology Dg Knee Complete 4 Views Left  Result Date: 11/21/2016 CLINICAL DATA:  Twisted knee, fell today. EXAM: LEFT KNEE - COMPLETE 4+ VIEW COMPARISON:  None. FINDINGS: No evidence of fracture, dislocation, or joint effusion. No evidence of arthropathy or other focal bone abnormality. Soft tissues are unremarkable. IMPRESSION: Negative. Electronically Signed   By: Awilda Metro M.D.   On: 11/21/2016 22:24    Procedures Procedures (including critical care time)  Medications Ordered in ED Medications  chlorthalidone (HYGROTON) tablet 25 mg (25 mg Oral Given 11/21/16 2311)  metoprolol tartrate (LOPRESSOR) tablet 75 mg (75 mg Oral Given 11/21/16 2130)  enalapril (VASOTEC) tablet 10 mg (10 mg Oral Given 11/21/16 2311)     Initial Impression / Assessment and Plan / ED Course  I have reviewed the triage vital signs and the nursing notes.  Pertinent labs & imaging results that were available during my care of the patient were reviewed by me and considered in my medical decision making (see chart for details).      Patient with left knee pain. Exam unconcerning for internal derangement. X-ray negative for acute pathology. Patient hypertensive while in ED due to missing his doses of HTN medications today; though asymptomatic. Patient given his PTA dose of metoprolol while in ED with some improvement in blood pressure. Patient given his morning dose of enalapril prior to discharge and instructed to take his blood pressure medications as prescribed in the morning. Knee placed in Ace wrap for comfort. Patient is ambulatory without limp. RICE therapy. Pt is to follow up with PCP. Patient is well-appearing, safe for discharge home.   Patient discussed with Dr. Estell Harpin, who agrees with care plan.  Discussed results, findings, treatment and follow up. Patient advised of return precautions. Patient verbalized understanding and agreed with plan.  Final Clinical Impressions(s) / ED Diagnoses   Final diagnoses:  Acute pain of left knee    New Prescriptions Discharge Medication List as of 11/21/2016 11:14 PM       Russo, [place] N, PA-C 11/22/16 1610    Bethann Berkshire, MD 11/22/16 1159

## 2016-11-21 NOTE — ED Triage Notes (Signed)
Pt has not taking his medication today for HR and BP

## 2016-11-21 NOTE — Discharge Instructions (Signed)
Please read instructions below. Take your blood pressure medications as prescribed in the morning. Apply ice to your knee for 20 minutes at a time. You can take advil every 6 hours as needed for pain. Schedule an appointment with your PCP follow-up on your injury if symptoms persist. Return to the ER for new or concerning symptoms.

## 2017-01-10 ENCOUNTER — Other Ambulatory Visit (HOSPITAL_COMMUNITY): Payer: Self-pay | Admitting: Internal Medicine

## 2017-01-10 ENCOUNTER — Ambulatory Visit (HOSPITAL_COMMUNITY)
Admission: RE | Admit: 2017-01-10 | Discharge: 2017-01-10 | Disposition: A | Discharge status: Home / Self Care | Payer: Medicaid Other | Source: Ambulatory Visit | Attending: Internal Medicine | Admitting: Internal Medicine

## 2017-01-10 DIAGNOSIS — M545 Low back pain: Secondary | ICD-10-CM | POA: Diagnosis present

## 2017-02-13 LAB — CBC WITH DIFFERENTIAL/PLATELET
BASOS PCT: 0.4 %
Basophils Absolute: 66 cells/uL (ref 0–200)
EOS ABS: 266 {cells}/uL (ref 15–500)
Eosinophils Relative: 1.6 %
HEMATOCRIT: 43.4 % (ref 38.5–50.0)
HEMOGLOBIN: 14.1 g/dL (ref 13.2–17.1)
Lymphs Abs: 3353 cells/uL (ref 850–3900)
MCH: 26.6 pg — AB (ref 27.0–33.0)
MCHC: 32.5 g/dL (ref 32.0–36.0)
MCV: 81.9 fL (ref 80.0–100.0)
MONOS PCT: 6.1 %
MPV: 9 fL (ref 7.5–12.5)
NEUTROS ABS: 11902 {cells}/uL — AB (ref 1500–7800)
Neutrophils Relative %: 71.7 %
Platelets: 467 10*3/uL — ABNORMAL HIGH (ref 140–400)
RBC: 5.3 10*6/uL (ref 4.20–5.80)
RDW: 13.9 % (ref 11.0–15.0)
Total Lymphocyte: 20.2 %
WBC mixed population: 1013 cells/uL — ABNORMAL HIGH (ref 200–950)
WBC: 16.6 10*3/uL — ABNORMAL HIGH (ref 3.8–10.8)

## 2017-02-13 LAB — TESTOS,TOTAL,FREE AND SHBG (FEMALE)
FREE TESTOSTERONE: 103.7 pg/mL (ref 35.0–155.0)
SEX HORMONE BINDING: 16 nmol/L (ref 10–50)
TESTOSTERONE, TOTAL, LC-MS-MS: 532 ng/dL (ref 250–1100)

## 2017-02-17 ENCOUNTER — Ambulatory Visit (INDEPENDENT_AMBULATORY_CARE_PROVIDER_SITE_OTHER): Payer: Medicaid Other | Admitting: "Endocrinology

## 2017-02-17 ENCOUNTER — Encounter: Payer: Self-pay | Admitting: "Endocrinology

## 2017-02-17 VITALS — BP 173/108 | HR 106 | Ht 69.5 in | Wt 396.0 lb

## 2017-02-17 DIAGNOSIS — D72829 Elevated white blood cell count, unspecified: Secondary | ICD-10-CM | POA: Diagnosis not present

## 2017-02-17 DIAGNOSIS — E291 Testicular hypofunction: Secondary | ICD-10-CM | POA: Diagnosis not present

## 2017-02-17 DIAGNOSIS — R7303 Prediabetes: Secondary | ICD-10-CM

## 2017-02-17 NOTE — Progress Notes (Signed)
Subjective:    Patient ID: Allen Harding, male    DOB: [birth year], PCP Rosita Fire, MD   Past Medical History:  Diagnosis Date  . ADHD (attention deficit hyperactivity disorder)   . Anxiety   . Autism disorder   . 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 Diabetes mellitus   . Hypertension   . Prediabetes    Past Surgical History:  Procedure Laterality Date  . CIRCUMCISION    . CIRCUMCISION REVISION    . FRENULECTOMY, LINGUAL    . lipoma    . ORCHIOPEXY     Social History   Social History  . Marital status: Single    Spouse name: N/A  . Number of children: N/A  . Years of education: N/A   Social History Main Topics  . Smoking status: Former Smoker    Packs/day: 0.25    Quit date: 01/09/2014  . Smokeless tobacco: Never Used  . Alcohol use No  . Drug use: No  . Sexual activity: Not Asked   Other Topics Concern  . None   Social History Narrative   Lives with mom, sister, 2 nieces, grandparents and sister's fiance.    Outpatient Encounter Prescriptions as of 02/17/2017  Medication Sig  . atomoxetine (STRATTERA) 40 MG capsule Take 80 mg by mouth daily.  . cetirizine (ZYRTEC) 10 MG tablet TAKE ONE TABLET BY MOUTH DAILY.  . chlorthalidone (HYGROTON) 25 MG tablet Take 50 mg by mouth.  . Cholecalciferol (VITAMIN D3) 5000 units CAPS Take 1 capsule (5,000 Units total) by mouth daily.  . enalapril (VASOTEC) 10 MG tablet Take 1 tablet (10 mg total) by mouth daily.  . ferrous sulfate 325 (65 FE) MG tablet Take 1 tablet (325 mg total) by mouth daily with breakfast.  . guanFACINE (INTUNIV) 2 MG TB24 Take 2 mg by mouth daily.   Marland Kitchen letrozole (FEMARA) 2.5 MG tablet Take 1 tablet (2.5 mg total) by mouth daily.  . metFORMIN (GLUCOPHAGE) 1000 MG tablet TAKE (1) TABLET BY MOUTH TWICE DAILY WITH FOOD FOR DIABETES.  . Metoprolol Tartrate 75 MG TABS Take 75 mg by mouth 2 (two) times daily.  Marland Kitchen omeprazole (PRILOSEC) 40 MG capsule Take 1 capsule (40 mg total) by  mouth 2 (two) times daily.  Marland Kitchen oxybutynin (DITROPAN-XL) 10 MG 24 hr tablet TAKE 1 TABLET BY MOUTH DAILY.  Marland Kitchen sertraline (ZOLOFT) 25 MG tablet Take 100 mg by mouth daily. Reported on 06/16/2015  . SYRINGE-NEEDLE, DISP, 3 ML (BD ECLIPSE SYRINGE) 21G X 1" 3 ML MISC by Does not apply route.  Marland Kitchen testosterone cypionate (DEPOTESTOSTERONE CYPIONATE) 200 MG/ML injection Inject 1 mL (200 mg total) into the muscle every 14 (fourteen) days.  . traZODone (DESYREL) 100 MG tablet Take 100 mg by mouth at bedtime. Reported on 06/16/2015   No facility-administered encounter medications on file as of 02/17/2017.    ALLERGIES: No Known Allergies VACCINATION STATUS: Immunization History  Administered Date(s) Administered  . DTaP 06/11/1995, 08/06/1995, 09/24/1995, 06/30/1996, 09/24/1999  . H1N1 11/24/2008  . HPV Quadrivalent 02/08/2014  . Hepatitis A, Ped/Adol-2 Dose 02/03/2013, 08/19/2013  . Hepatitis B 23-Jul-1994, 05/07/1995, 09/24/1995  . HiB (PRP-OMP) 06/11/1995, 08/06/1995, 09/24/1995, 06/30/1996  . IPV 06/11/1995, 08/06/1995, 09/24/1995, 09/24/1999  . Influenza Whole 03/22/2009, 06/17/2011, 07/23/2012  . Influenza,inj,Quad PF,6+ Mos 05/02/2015  . MMR 03/31/1996, 09/24/1999  . Meningococcal Conjugate 11/24/2008, 08/19/2013  . Td 01/07/2006  . Tdap 01/07/2006  . Varicella 03/31/1996, 06/01/1997    HPI 21 year old male patient  with medical history as above. 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 - He has been following at [facility] until March 2017 with Dr. Baldo Ash. -He does not know for sure when he started testosterone therapy currently at 200 mg IM every 2 weeks. - He has been more consistent  in his testosterone injection since his last visit,  causing his total testosterone to to improve to 532 from  147 .  -He has dealt with heavy weight almost all of his life,  no success on weight loss. - Over the  years he was diagnosed with prediabetes for which he was initiated on metformin 1000 mg by mouth twice a day. His most recent A1c is 6.1%. -He has a polypharmacy, see below. -He has no acute complaints today. -He underwent orchidopexy for right-sided undescended testes. -He denies testicular injury, radiation, infection, STD. -He denies any history of head injury.   Review of Systems Constitutional: + heavy weight, + fatigue, no subjective hyperthermia/hypothermia Eyes: no blurry vision, no xerophthalmia ENT: no sore throat, no nodules palpated in throat, no dysphagia/odynophagia, no hoarseness Cardiovascular: no CP/SOB/palpitations/leg swelling Respiratory: no cough/SOB Gastrointestinal: no N/V/D/C Musculoskeletal: no muscle/joint aches Skin: no rashes Neurological: no tremors/numbness/tingling/dizziness Psychiatric: + depression/anxiety  Objective:    BP (!) 173/108   Pulse (!) 106   Ht 5' 9.5" (1.765 m)   Wt (!) 396 lb (179.6 kg)   BMI 57.64 kg/m   Wt Readings from Last 3 Encounters:  02/17/17 (!) 396 lb (179.6 kg)  11/21/16 (!) 395 lb 9.6 oz (179.4 kg)  10/16/16 (!) 378 lb (171.5 kg)    Physical Exam Constitutional: Obese, in NAD Eyes: PERRLA, EOMI, no exophthalmos ENT: moist mucous membranes, no thyromegaly, no cervical lymphadenopathy Cardiovascular: RRR, No MRG Respiratory: CTA B Gastrointestinal: abdomen soft, NT, ND, BS+ Genital exam : Deferred, due to patient preference. Musculoskeletal: no deformities, strength intact in all 4 Skin: moist, warm, no rashes, he has scant mustache and hair on his chin. Neurological: no tremor with outstretched hands, DTR normal in all 4  CMP ( most recent) CMP     Component Value Date/Time   NA 140 09/18/2016 1415   K 4.3 09/18/2016 1415   CL 102 09/18/2016 1415   CO2 27 09/18/2016 1415   GLUCOSE 190 (H) 09/18/2016 1415   BUN 10 09/18/2016 1415   CREATININE 0.74 09/18/2016 1415   CALCIUM 9.6 09/18/2016 1415   PROT 6.5  09/18/2016 1415   ALBUMIN 3.5 (L) 09/18/2016 1415   ALBUMIN 3.5 (L) 09/18/2016 1415   AST 15 09/18/2016 1415   ALT 17 09/18/2016 1415   ALKPHOS 109 09/18/2016 1415   BILITOT 0.3 09/18/2016 1415   GFRNONAA >89 03/15/2016 1216   GFRAA >89 03/15/2016 1216   Diabetic Labs (most recent): Lab Results  Component Value Date   HGBA1C 6.1 (H) 09/18/2016   HGBA1C 6.0 (H) 03/15/2016   HGBA1C 6.0 (H) 08/08/2015    Lipid Panel     Component Value Date/Time   CHOL 222 (H) 08/08/2015 1152   TRIG 105 08/08/2015 1152   HDL 46 08/08/2015 1152   CHOLHDL 4.8 08/08/2015 1152   VLDL 21 08/08/2015 1152   LDLCALC 155 (H) 08/08/2015 1152    Recent Results (from the past 2160 hour(s))  CBC with Differential/Platelet     Status: Abnormal   Collection Time: 02/10/17 11:24 AM  Result Value Ref Range   WBC 16.6 (H) 3.8 - 10.8 Thousand/uL  RBC 5.30 4.20 - 5.80 Million/uL   Hemoglobin 14.1 13.2 - 17.1 g/dL   HCT 43.4 38.5 - 50.0 %   MCV 81.9 80.0 - 100.0 fL   MCH 26.6 (L) 27.0 - 33.0 pg   MCHC 32.5 32.0 - 36.0 g/dL   RDW 13.9 11.0 - 15.0 %   Platelets 467 (H) 140 - 400 Thousand/uL   MPV 9.0 7.5 - 12.5 fL   Neutro Abs 11,902 (H) 1,500 - 7,800 cells/uL   Lymphs Abs 3,353 850 - 3,900 cells/uL   WBC mixed population 1,013 (H) 200 - 950 cells/uL   Eosinophils Absolute 266 15 - 500 cells/uL   Basophils Absolute 66 0 - 200 cells/uL   Neutrophils Relative % 71.7 %   Total Lymphocyte 20.2 %   Monocytes Relative 6.1 %   Eosinophils Relative 1.6 %   Basophils Relative 0.4 %  Testos,Total,Free and SHBG (Male)     Status: None   Collection Time: 02/10/17 11:24 AM  Result Value Ref Range   Testosterone, Total, LC-MS-MS 532 250 - 1,100 ng/dL    Comment: . For additional information, please refer to http://education.questdiagnostics.com/faq/ TotalTestosteroneLCMSMSFAQ165 (This link is being provided for informational/ educational purposes only.) . This test was developed and its analytical  performance characteristics have been determined by [place], [place]. It has not been cleared or approved by the U.S. Food and Drug Administration. This assay has been validated pursuant to the CLIA regulations and is used for clinical purposes. .    Free Testosterone 103.7 35.0 - 155.0 pg/mL    Comment: . This test was developed and its analytical performance characteristics have been determined by [place], [place]. It has not been cleared or approved by the U.S. Food and Drug Administration. This assay has been validated pursuant to the CLIA regulations and is used for clinical purposes. .    Sex Hormone Binding 16 10 - 50 nmol/L     Assessment & Plan:   1. Pre-diabetes - I have reviewed his history and evaluated patient clinically. His prediabetes is directly linked to his obesity, see #3 below. He is A1c remains at 6.1%- consistent with good control. - I advised him to stay on his metformin 1000 mg by mouth twice a day.   2. Hypogonadism male - Etiology most likely multifactorial including chromosomal abnormality and history of undescended testes.  - He has required testosterone supplement for several years now. I have reviewed his EMR records and found out that he had low testosterone starting from at least 2012. 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 -I have not seen FSH/LH levels, would have  been useful prior to initiation of testosterone therapy however the utility of that test now is unremarkable. - His Testosterone level at  532 is acceptable, I advised him to continue testosterone 200 mg IM every 2 weeks.  -I will obtain total testosterone, CBC  before his next visit. - Review of his CBC showed persistent leukocytosis since at least July 2017, most recent 16600 . he is associated hematocrit is normal at 43.4. He may need evaluation by hematology /oncology for unexplained leukocytosis. I advised him to seek  referral to Dr. Tressie Stalker for his PMD.  3. Morbid obesity due to excess calories ([place]) - See #1 above. - The most likely cause of his morbid obesity is still excess caloric intake.  Cushing's syndrome, hypothyroidism are ruled out as a cause of weight gain. - I have spent 30 minutes counseling  him on diet specifically talked about ways to cut carb consumption and increasing protein intake as necessary. - Consult with dietitian/diabetes educator Jearld Fenton, CDE in progress. - His mother gathered information on bariatric surgery, however his insurance would not cover.   4. Essential hypertension, benign - unontrolled.  He did not take his blood pressure medications this morning. I advised him to continue his current medications including chlorthalidone, enalapril, metoprolol and follow-up with Dr. Legrand Rams for primary care needs.  5. Undescended right testicle - He is status post orchidopexy of right testicle. The details of his surgical history are not available for review.  - 30 minutes of time was spent on the care of this patient , 50% of which was applied for counseling on diabetes complications and their preventions. - I advised patient to maintain close follow up with Rosita Fire, MD for primary care needs. Follow up plan: Return in about 3 months (around 05/19/2017) for follow up with pre-visit labs.  Glade Lloyd, MD Phone: [phone]  Fax: [phone]  This note was partially dictated with voice recognition software. Similar sounding words can be transcribed inadequately or may not  be corrected upon review.  02/17/2017, 12:43 PM

## 2017-02-17 NOTE — Patient Instructions (Signed)

## 2017-03-17 ENCOUNTER — Encounter (HOSPITAL_COMMUNITY): Payer: Medicaid Other | Attending: Oncology | Admitting: Oncology

## 2017-03-17 ENCOUNTER — Encounter (HOSPITAL_COMMUNITY): Payer: Medicaid Other

## 2017-03-17 ENCOUNTER — Encounter (HOSPITAL_COMMUNITY): Payer: Self-pay | Admitting: Oncology

## 2017-03-17 VITALS — BP 171/89 | HR 92 | Resp 18 | Ht 69.0 in | Wt 394.0 lb

## 2017-03-17 DIAGNOSIS — D72829 Elevated white blood cell count, unspecified: Secondary | ICD-10-CM | POA: Insufficient documentation

## 2017-03-17 LAB — CBC WITH DIFFERENTIAL/PLATELET
Basophils Absolute: 0 10*3/uL (ref 0.0–0.1)
Basophils Relative: 0 %
Eosinophils Absolute: 0.3 10*3/uL (ref 0.0–0.7)
Eosinophils Relative: 2 %
HEMATOCRIT: 44 % (ref 39.0–52.0)
HEMOGLOBIN: 14 g/dL (ref 13.0–17.0)
LYMPHS ABS: 3 10*3/uL (ref 0.7–4.0)
LYMPHS PCT: 20 %
MCH: 26.9 pg (ref 26.0–34.0)
MCHC: 31.8 g/dL (ref 30.0–36.0)
MCV: 84.5 fL (ref 78.0–100.0)
Monocytes Absolute: 0.9 10*3/uL (ref 0.1–1.0)
Monocytes Relative: 6 %
NEUTROS PCT: 72 %
Neutro Abs: 10.5 10*3/uL — ABNORMAL HIGH (ref 1.7–7.7)
Platelets: 394 10*3/uL (ref 150–400)
RBC: 5.21 MIL/uL (ref 4.22–5.81)
RDW: 14.1 % (ref 11.5–15.5)
WBC: 14.7 10*3/uL — AB (ref 4.0–10.5)

## 2017-03-17 LAB — COMPREHENSIVE METABOLIC PANEL
ALK PHOS: 108 U/L (ref 38–126)
ALT: 22 U/L (ref 17–63)
AST: 18 U/L (ref 15–41)
Albumin: 3.5 g/dL (ref 3.5–5.0)
Anion gap: 9 (ref 5–15)
BUN: 8 mg/dL (ref 6–20)
CHLORIDE: 101 mmol/L (ref 101–111)
CO2: 29 mmol/L (ref 22–32)
CREATININE: 0.83 mg/dL (ref 0.61–1.24)
Calcium: 9.2 mg/dL (ref 8.9–10.3)
GFR calc non Af Amer: >60 mL/min (ref >60)
GLUCOSE: 121 mg/dL — AB (ref 65–99)
POTASSIUM: 3.7 mmol/L (ref 3.5–5.1)
Sodium: 139 mmol/L (ref 135–145)
Total Bilirubin: 0.4 mg/dL (ref 0.3–1.2)
Total Protein: 7.5 g/dL (ref 6.5–8.1)

## 2017-03-17 NOTE — Patient Instructions (Signed)
[facility] at [facility] Discharge Instructions  RECOMMENDATIONS MADE BY THE CONSULTANT AND ANY TEST RESULTS WILL BE SENT TO YOUR REFERRING PHYSICIAN.  You were seen today by Dr Doylene Canning. US abdomen to be scheduled. Return in 2 weeks for follow up and results.   Thank you for choosing [facility] at [facility] to provide your oncology and hematology care.  To afford each patient quality time with our provider, please arrive at least 15 minutes before your scheduled appointment time.    If you have a lab appointment with the Cancer Center please come in thru the  Main Entrance and check in at the main information desk  You need to re-schedule your appointment should you arrive 10 or more minutes late.  We strive to give you quality time with our providers, and arriving late affects you and other patients whose appointments are after yours.  Also, if you no show three or more times for appointments you may be dismissed from the clinic at the providers discretion.     Again, thank you for choosing [facility].  Our hope is that these requests will decrease the amount of time that you wait before being seen by our physicians.       _____________________________________________________________  Should you have questions after your visit to [facility], please contact our office at [phone] between the hours of 8:30 a.m. and 4:30 p.m.  Voicemails left after 4:30 p.m. will not be returned until the following business day.  For prescription refill requests, have your pharmacy contact our office.       Resources For Cancer Patients and their Caregivers ? American Cancer Society: Can assist with transportation, wigs, general needs, runs Look Good Feel Better.        873-247-3879 ? Cancer Care: Provides financial assistance, online support groups, medication/co-pay assistance.  1-800-813-HOPE 431 425 1008) ? [facility] Assists [place] cancer patients and their families through emotional , educational and financial support.  226-431-9328 ? [place] DSS Where to apply for food stamps, Medicaid and utility assistance. 571-053-7401 ? [facility]: Transportation to medical appointments. 603-092-2465 ? Social Security Administration: May apply for disability if have a Stage IV cancer. ([phone] [phone] 2134864096 ? [employer], Disability and Transit Services: Assists with nutrition, care and transit needs. 8193839060  Cancer Center Support Programs: @ > Cancer Support Group  2nd Tuesday of the month 1pm-2pm, Journey Room  > Creative Journey  3rd Tuesday of the month 1130am-1pm, Journey Room  > Look Good Feel Better  1st Wednesday of the month 10am-12 noon, Journey Room (Call American Cancer Society to register [phone])

## 2017-03-17 NOTE — Progress Notes (Signed)
[facility] Initial Visit:  Patient Care Team: Avon Gully, MD as PCP - General (Internal Medicine)  CHIEF COMPLAINTS/PURPOSE OF CONSULTATION: Leukocytosis   No history exists.    HISTORY OF PRESENTING ILLNESS: Allen Harding 21 y.o. male is here because of  Persistent leukocytosis. 21 year old gentleman with multiple medical problem and on multiple medications as slowly rising leukocytosis. In April of 2018 white count was 13,000 as increase in September to 16.6 thousand.  Patient's hemoglobin and platelet count remains normal and differential count remains without any abnormal   Cells  Patient is on the multiple medication including testosterone as well as is morbidly obese.  Also continues to gain weight For his chromosomal  abnormality patient is being followed by endocrinologist  Review of Systems - Oncology GENERAL: Morbidly obese individual in any acute distress PERFORMANCE STATUS (ECOG): 0 HEENT:  No visual changes, runny nose, sore throat, mouth sores or tenderness. Lungs: No shortness of breath or cough.  No hemoptysis. Cardiac:  No chest pain, palpitations, orthopnea, or PND. GI:  No nausea, vomiting, diarrhea, constipation, melena or hematochezia. GU:  No urgency, frequency, dysuria, or hematuria. Musculoskeletal:  No back pain.  No joint pain.  No muscle tenderness. Extremities:  No pain or swelling. Skin:  No rashes or skin changes. Neuro:  No headache, numbness or weakness, balance or coordination issues. Endocrine:  No diabetes, thyroid issues, hot flashes or night sweats. Psych:  No mood changes, depression or anxiety. Pain:  No focal pain. Review of systems:  All other systems reviewed and found to be negative. MEDICAL HISTORY: Past Medical History:  Diagnosis Date  . ADHD (attention deficit hyperactivity disorder)   . Anxiety   . Autism disorder   . 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 Diabetes mellitus   .  Hypertension   . Prediabetes     SURGICAL HISTORY: Past Surgical History:  Procedure Laterality Date  . CIRCUMCISION    . CIRCUMCISION REVISION    . FRENULECTOMY, LINGUAL    . lipoma    . ORCHIOPEXY      SOCIAL HISTORY: Social History   Social History  . Marital status: Single    Spouse name: N/A  . Number of children: N/A  . Years of education: N/A   Occupational History  . Not on file.   Social History Main Topics  . Smoking status: Former Smoker    Packs/day: 0.25    Quit date: 01/09/2014  . Smokeless tobacco: Never Used  . Alcohol use No  . Drug use: No  . Sexual activity: Not on file   Other Topics Concern  . Not on file   Social History Narrative   Lives with mom, sister, 2 nieces, grandparents and sister's fiance.     FAMILY HISTORY Family History  Problem Relation Age of Onset  . Obesity Mother   . Diabetes Mother   . Hypertension Mother   . Hyperlipidemia Mother   . Obesity Sister   . Obesity Maternal Grandmother   . Diabetes Maternal Grandmother   . Cancer Maternal Grandmother   . Stroke Maternal Grandmother   . Obesity Maternal Grandfather     ALLERGIES:  has No Known Allergies.  MEDICATIONS:  Current Outpatient Prescriptions  Medication Sig Dispense Refill  . atomoxetine (STRATTERA) 40 MG capsule Take 80 mg by mouth daily.    . cetirizine (ZYRTEC) 10 MG tablet TAKE ONE TABLET BY MOUTH DAILY. 30 tablet 0  . chlorthalidone (HYGROTON)  25 MG tablet Take 50 mg by mouth.    . Cholecalciferol (VITAMIN D3) 5000 units CAPS Take 1 capsule (5,000 Units total) by mouth daily. 90 capsule 0  . enalapril (VASOTEC) 10 MG tablet Take 1 tablet (10 mg total) by mouth daily. 30 tablet 3  . ferrous sulfate 325 (65 FE) MG tablet Take 1 tablet (325 mg total) by mouth daily with breakfast. 30 tablet 3  . guanFACINE (INTUNIV) 2 MG TB24 Take 2 mg by mouth daily.     Marland Kitchen letrozole (FEMARA) 2.5 MG tablet Take 1 tablet (2.5 mg total) by mouth daily. 30 tablet 3  .  metFORMIN (GLUCOPHAGE) 1000 MG tablet TAKE (1) TABLET BY MOUTH TWICE DAILY WITH FOOD FOR DIABETES. 60 tablet 0  . Metoprolol Tartrate 75 MG TABS Take 75 mg by mouth 2 (two) times daily. 60 tablet 3  . omeprazole (PRILOSEC) 40 MG capsule Take 1 capsule (40 mg total) by mouth 2 (two) times daily. 60 capsule 6  . oxybutynin (DITROPAN-XL) 10 MG 24 hr tablet TAKE 1 TABLET BY MOUTH DAILY. 30 tablet 3  . sertraline (ZOLOFT) 25 MG tablet Take 100 mg by mouth daily. Reported on 06/16/2015    . SYRINGE-NEEDLE, DISP, 3 ML (BD ECLIPSE SYRINGE) 21G X 1" 3 ML MISC by Does not apply route.    Marland Kitchen testosterone cypionate (DEPOTESTOSTERONE CYPIONATE) 200 MG/ML injection Inject 1 mL (200 mg total) into the muscle every 14 (fourteen) days. 10 mL 2  . traZODone (DESYREL) 100 MG tablet Take 100 mg by mouth at bedtime. Reported on 06/16/2015     No current facility-administered medications for this visit.     PHYSICAL EXAMINATION:  ECOG PERFORMANCE STATUS: 0 - Asymptomatic  A blood pressure is 1 71 x 89.  Pulse is 92.  Respiration 18.  Temperature is normal.  Oxygen saturation 100%.  Weight is 394 pounds height 5 feet 9 inches   Physical Exam GENERAL:  Well developed, well nourished, sitting comfortably in the exam room in no acute distress. He s morbidly obese MENTAL STATUS:  Alert and oriented to person, place and time. HEAD:   Normocephalic, atraumatic, face symmetric, no Cushingoid features. EYES:  Pupils equal round and reactive to light and accomodation.  No conjunctivitis or scleral icterus. ENT:  Oropharynx clear without lesion.  Tongue normal. Mucous membranes moist.  RESPIRATORY:  Clear to auscultation without rales, wheezes or rhonchi. CARDIOVASCULAR:  Regular rate and rhythm without murmur, rub or gallop.  ABDOMEN:  Soft, non-tender, with active bowel sounds, and no hepatosplenomegaly.  No masses. BACK:  No CVA tenderness.  No tenderness on percussion of the back or rib cage. SKIN:  Rash in your  right arm.  He had maculopapular EXTREMITIES: No edema, no skin discoloration or tenderness.  No palpable cords. LYMPH NODES: No palpable cervical, supraclavicular, axillary or inguinal adenopathy  NEUROLOGICAL: Unremarkable. P  LABORATORY DATA: I have personally reviewed the data as listed:  No visits with results within 1 Month(s) from this visit.  Latest known visit with results is:  Office Visit on 10/16/2016  Component Date Value Ref Range Status  . WBC 02/10/2017 16.6* 3.8 - 10.8 Thousand/uL Final  . RBC 02/10/2017 5.30  4.20 - 5.80 Million/uL Final  . Hemoglobin 02/10/2017 14.1  13.2 - 17.1 g/dL Final  . HCT 16/03/9603 43.4  38.5 - 50.0 % Final  . MCV 02/10/2017 81.9  80.0 - 100.0 fL Final  . MCH 02/10/2017 26.6* 27.0 - 33.0 pg Final  .  MCHC 02/10/2017 32.5  32.0 - 36.0 g/dL Final  . RDW 16/03/9603 13.9  11.0 - 15.0 % Final  . Platelets 02/10/2017 467* 140 - 400 Thousand/uL Final  . MPV 02/10/2017 9.0  7.5 - 12.5 fL Final  . Neutro Abs 02/10/2017 11902* 1,500 - 7,800 cells/uL Final  . Lymphs Abs 02/10/2017 3353  850 - 3,900 cells/uL Final  . WBC mixed population 02/10/2017 1013* 200 - 950 cells/uL Final  . Eosinophils Absolute 02/10/2017 266  15 - 500 cells/uL Final  . Basophils Absolute 02/10/2017 66  0 - 200 cells/uL Final  . Neutrophils Relative % 02/10/2017 71.7  % Final  . Total Lymphocyte 02/10/2017 20.2  % Final  . Monocytes Relative 02/10/2017 6.1  % Final  . Eosinophils Relative 02/10/2017 1.6  % Final  . Basophils Relative 02/10/2017 0.4  % Final  . Testosterone, Total, LC-MS-MS 02/10/2017 532  250 - 1,100 ng/dL Final   Comment: . For additional information, please refer to http://education.questdiagnostics.com/faq/ TotalTestosteroneLCMSMSFAQ165 (This link is being provided for informational/ educational purposes only.) . This test was developed and its analytical performance characteristics have been determined by [facility]  [place], [place]. It has not been cleared or approved by the U.S. Food and Drug Administration. This assay has been validated pursuant to the CLIA regulations and is used for clinical purposes. .   . Free Testosterone 02/10/2017 103.7  35.0 - 155.0 pg/mL Final   Comment: . This test was developed and its analytical performance characteristics have been determined by [facility] [place], [place]. It has not been cleared or approved by the U.S. Food and Drug Administration. This assay has been validated pursuant to the CLIA regulations and is used for clinical purposes. .   . Sex Hormone Binding 02/10/2017 16  10 - 50 nmol/L Final    RADIOGRAPHIC STUDIES: I have personally reviewed the radiological images as listed and agree with the findings in the report  No results found.  ASSESSMENT/PLAN Leukocytosis which is persistent over . of 2 years with high neutrophil count. We will recheck CBC.  BCR ABL.  Ultrasound of abdomen to rule out any splenomegaly Reevaluate patient in 4 weeks. Barring any abnormal cells visible on peripheral review smear for any chromosomal abnormality like BCR ABL abnormality leukocyte doses may be related to multiple medication testosterone therapy or morbid obesity. Follow this patient in 3-4 weeks after all information is available      All questions were answered. The patient knows to call the clinic with any problems, questions or concerns. Mother grandfather was present during the discussion.  This note was electronically signed.    Johney Maine, MD  03/17/2017 8:35 AM

## 2017-03-21 LAB — BCR-ABL1, CML/ALL, PCR, QUANT

## 2017-03-24 ENCOUNTER — Ambulatory Visit (HOSPITAL_COMMUNITY)
Admission: RE | Admit: 2017-03-24 | Discharge: 2017-03-24 | Disposition: A | Discharge status: Home / Self Care | Payer: Medicaid Other | Source: Ambulatory Visit | Attending: Oncology | Admitting: Oncology

## 2017-03-24 DIAGNOSIS — D72829 Elevated white blood cell count, unspecified: Secondary | ICD-10-CM

## 2017-03-24 DIAGNOSIS — K760 Fatty (change of) liver, not elsewhere classified: Secondary | ICD-10-CM | POA: Diagnosis not present

## 2017-04-01 ENCOUNTER — Encounter (HOSPITAL_COMMUNITY): Payer: Medicaid Other | Attending: Oncology | Admitting: Oncology

## 2017-04-01 ENCOUNTER — Encounter (HOSPITAL_COMMUNITY): Payer: Self-pay | Admitting: Oncology

## 2017-04-01 DIAGNOSIS — D72829 Elevated white blood cell count, unspecified: Secondary | ICD-10-CM | POA: Diagnosis present

## 2017-04-01 NOTE — Progress Notes (Signed)
[facility] Follow up:    Rosita Fire, MD [place] [address]   DIAGNOSIS: Leukocytosis  SUMMARY OF HEMATOLOGIC HISTORY: Chronic leukocytosis with neutrophilic predominance  CURRENT THERAPY: Observation  INTERVAL HISTORY: Allen Harding 21 y.o. male returns for follow-up of his leukocytosis. He states he has been doing well and has not had any new complaints. He denies any recent infections. He denies any smoking. He denies any chronic steroid use. He is on testosterone replacement for hypogonadism. He denies any chest pain, fatigue, shortness of breath, abdominal pain, focal weakness.    Patient Active Problem List   Diagnosis Date Noted  . Leukocytosis 04/01/2017  . Undescended right testicle 12/21/2015  . Tachycardia 11/16/2015  . ADHD (attention deficit hyperactivity disorder)   . Allergic rhinitis 12/14/2014  . Iron deficiency anemia 09/29/2014  . Medication monitoring encounter 09/06/2014  . Enuresis 08/20/2013  . Well child check 08/20/2013  . Absence of bladder continence 08/20/2013  . Gynecomastia 07/21/2012  . Hypogonadism male 03/03/2012  . Testicular hypofunction 03/03/2012  . Dysthymia 10/29/2011  . Chromosomal abnormality syndrome   . Pre-diabetes 10/25/2010  . Morbid obesity ([place]) 10/25/2010  . Essential hypertension, benign 10/25/2010  . Goiter, unspecified 10/25/2010  . Autism 10/25/2010  . Autism spectrum 10/25/2010    has No Known Allergies.  MEDICAL HISTORY: Past Medical History:  Diagnosis Date  . ADHD (attention deficit hyperactivity disorder)   . Anxiety   . Autism disorder   . 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 Diabetes mellitus   . Hypertension   . Prediabetes     SURGICAL HISTORY: Past Surgical History:  Procedure Laterality Date  . CIRCUMCISION    . CIRCUMCISION REVISION    . FRENULECTOMY, LINGUAL    . lipoma    . ORCHIOPEXY      SOCIAL  HISTORY: Social History   Social History  . Marital status: Single    Spouse name: N/A  . Number of children: N/A  . Years of education: N/A   Occupational History  . Not on file.   Social History Main Topics  . Smoking status: Former Smoker    Packs/day: 0.25    Quit date: 01/09/2014  . Smokeless tobacco: Never Used  . Alcohol use No  . Drug use: No  . Sexual activity: Not on file   Other Topics Concern  . Not on file   Social History Narrative   Lives with mom, sister, 2 nieces, grandparents and sister's fiance.     FAMILY HISTORY: Family History  Problem Relation Age of Onset  . Obesity Mother   . Diabetes Mother   . Hypertension Mother   . Hyperlipidemia Mother   . Obesity Sister   . Obesity Maternal Grandmother   . Diabetes Maternal Grandmother   . Cancer Maternal Grandmother   . Stroke Maternal Grandmother   . Obesity Maternal Grandfather     Review of Systems - Oncology  ROS as per HPI otherwise 12 point ROS is negative.   PHYSICAL EXAMINATION Constitutional: Well-developed, well-nourished, morbidly obese male and in no distress.   HENT:  Head: Normocephalic and atraumatic.  Mouth/Throat: No oropharyngeal exudate. Mucosa moist. Eyes: Pupils are equal, round, and reactive to light. Conjunctivae are normal. No scleral icterus.  Neck: Normal range of motion. Neck supple. No JVD present.  Cardiovascular: Normal rate, regular rhythm and normal heart sounds.  Exam reveals no gallop and no friction rub.  No murmur heard. Pulmonary/Chest: Effort normal and breath sounds normal. No respiratory distress. No wheezes.No rales.  Abdominal: Soft. Bowel sounds are normal. No distension. There is no tenderness. There is no guarding.  Musculoskeletal: No edema or tenderness.  Lymphadenopathy:    No cervical or supraclavicular adenopathy.  Neurological: Alert and oriented to person, place, and time. No cranial nerve deficit.  Skin: Skin is warm and dry. No rash  noted. No erythema. No pallor.  Psychiatric: Affect and judgment normal.     Vitals:   04/01/17 1014  BP: (!) 178/100  Pulse: (!) 110  Resp: 16  Temp: 98.3 F (36.8 C)  SpO2: 98%    Physical Exam  LABORATORY DATA:  CBC    Component Value Date/Time   WBC 14.7 (H) 03/17/2017 0923   RBC 5.21 03/17/2017 0923   HGB 14.0 03/17/2017 0923   HCT 44.0 03/17/2017 0923   PLT 394 03/17/2017 0923   MCV 84.5 03/17/2017 0923   MCH 26.9 03/17/2017 0923   MCHC 31.8 03/17/2017 0923   RDW 14.1 03/17/2017 0923   LYMPHSABS 3.0 03/17/2017 0923   MONOABS 0.9 03/17/2017 0923   EOSABS 0.3 03/17/2017 0923   BASOSABS 0.0 03/17/2017 0923    CMP     Component Value Date/Time   NA 139 03/17/2017 0923   K 3.7 03/17/2017 0923   CL 101 03/17/2017 0923   CO2 29 03/17/2017 0923   GLUCOSE 121 (H) 03/17/2017 0923   BUN 8 03/17/2017 0923   CREATININE 0.83 03/17/2017 0923   CREATININE 0.74 09/18/2016 1415   CALCIUM 9.2 03/17/2017 0923   PROT 7.5 03/17/2017 0923   ALBUMIN 3.5 03/17/2017 0923   AST 18 03/17/2017 0923   ALT 22 03/17/2017 0923   ALKPHOS 108 03/17/2017 0923   BILITOT 0.4 03/17/2017 0923   GFRNONAA >60 03/17/2017 0923   GFRNONAA >89 03/15/2016 1216   GFRAA >60 03/17/2017 0923   GFRAA >89 03/15/2016 1216    RADIOGRAPHIC STUDIES:  US Abdomen Complete (Accession [id number]) (Order [id number])  Imaging  Date: 03/24/2017 Department: [facility] Released By: Alanda Slim Authorizing: Forest Gleason, MD  Exam Information   Status Exam Begun  Exam Ended   Final [99] 03/24/2017 9:09 AM 03/24/2017 9:35 AM  PACS Images   Show images for US Abdomen Complete  Study Result   CLINICAL DATA:  Leukocytosis.  EXAM: ABDOMEN ULTRASOUND COMPLETE  COMPARISON:  Renal ultrasound dated August 30, 2013.  FINDINGS: Gallbladder: No gallstones or wall thickening visualized. No sonographic Murphy sign noted by sonographer.  Common bile duct: Diameter: 4 mm,  normal.  Liver: No focal lesion identified. Increased parenchymal echogenicity. Portal vein is patent on color Doppler imaging with normal direction of blood flow towards the liver.  IVC: No abnormality visualized.  Pancreas: Not well visualized due to overlying bowel gas.  Spleen: Size and appearance within normal limits.  Right Kidney: Length: 12.3 cm. Echogenicity within normal limits. No mass or hydronephrosis visualized.  Left Kidney: Length: 13.7 cm. Echogenicity within normal limits. No mass or hydronephrosis visualized.  Abdominal aorta: Not well visualized due to overlying bowel gas.  Other findings: None.  IMPRESSION: Hepatic steatosis.  No acute abnormality.   Electronically Signed   By: Titus Dubin M.D.   On: 03/24/2017 09:45     PATHOLOGY:  BCR-ABL1, CML/ALL, PCR, QUANT  Order: [id number]  Status:  Edited Result - FINAL Visible to patient:  No (Not Released) Next appt:  05/20/2017 at 11:00 AM in Endocrinology [facility]  Nida, MD) Dx:  Leukocytosis, unspecified type   Ref Range & Units 2wk ago  b2a2 transcript % Comment   Comment: (NOTE)       <0.0032 %  (sensitivity limit of assay)   b3a2 transcript % Comment   Comment: (NOTE)       <0.0032 %  (sensitivity limit of assay)   E1A2 Transcript % Comment   Comment: (NOTE)       <0.0032 %  (sensitivity limit of assay)   Interpretation (BCRAL):  Comment   Comment: (NOTE)  NEGATIVE for the BCR-ABL1 e1a2 (p190), e13a2 (b2a2, p210) and e14a2  (b3a2, p210) fusion transcripts. These results do not rule out the  presence of rare BCR-ABL1 transcripts not detected by this assay.   Director Review [facility]):  Comment   Comment: (NOTE)  Katina Degree, MD, PhD  Director, [facility] [place], [place] [address]  [phone]   Background:  CommentVC   Comment: (NOTE)  This assay can detect three different types of  BCR-ABL1 fusion  transcripts associated with CML, ALL, and AML: e13a2 (previously  b2a2) and e14a2 (previously b3a2) (major breakpoint, p210), as  well as e1a2 (minor breakpoint, p190). The e13a2 and e14a2  transcript values are titrated to the current International Scale  (IS). The standardized baseline is 100% BCR-ABL1 (IS) and major  molecular response (MMR) is equivalent to 0.1% BCR-ABL1 (IS)  corresponding to a 3-log reduction. Results should be correlated with  appropriate clinical and laboratory information as indicated.   Methodology  CommentVC   Comment: (NOTE)  Total RNA is isolated from the sample and subject to a real-time,  reverse transcriptase polymerase chain reaction (RT-PCR). The PCR  primers and probes are specific for BCR-ABL1 e13a2, e14a2 and e1a2  fusion transcripts. The ABL1 transcript is amplified as the control  for cDNA quantity and quality. Serial dilutions of a validated  positive control RNA with known t(9;22) BCR-ABL1 are used as  reference for quantification of BCR-ABL1 relative to ABL1. The  numeric BCR-ABL1 level is reportd as % BCR-ABL1/ABL1 and the  detection sensitivity is 4.5 log below the standard baseline.  This test was developed and its performance characteristics  determined by [facility]. It has not been cleared or approved by the  Food and Drug Administration.  References:   1. Anastasia Fiedler and Branford S: Seminars in Hematology 2003;     40 (suppl2):62-68.   2. White HE, et al. Blood 2010; 116: e111-117.   3. NCCN Clinical Practice Guidelines in Oncology, Chronic     Myeloid Leukemia. V2. 2017.  Performed At: [facility]  [address] [place], [place] [id number]  Nechama Guard MD TD:[id number]  Performed At: [facility]  [place], [place] [id number]  Nechama Guard MD CB:[id number]   Resulting Agency  [id number]    Specimen Collected: 03/17/17 09:23 Last Resulted: 03/21/17 15:31             VC=Value has a corrected status               ASSESSMENT and THERAPY PLAN:  1. Chronic leukocytosis with neutrophilic predominance  -Reviewed patient's abdominal US in detail today, it was negative except for hepatic steatosis. -He has been ruled out for CML. He was found NEGATIVE for the BCR-ABL1 e1a2 (p190), e13a2 (b2a2, p210) and e14a2, (b3a2, p210) fusion transcripts. -Reviewed his medication list and did not find a cause for his  neutrophilia there. 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 He has had mild neutrophilia for over 2 years now. -Recommend to continue observation of his blood counts. -RTC in 6 months with CBC, CMP.   All questions were answered. The patient knows to call the clinic with any problems, questions or concerns. We can certainly see the patient much sooner if necessary.  This note was electronically signed.  Twana First, MD 04/01/2017

## 2017-05-15 LAB — CBC WITH DIFFERENTIAL/PLATELET
Basophils Absolute: 72 cells/uL (ref 0–200)
Basophils Relative: 0.5 %
Eosinophils Absolute: 302 cells/uL (ref 15–500)
Eosinophils Relative: 2.1 %
HEMATOCRIT: 40.9 % (ref 38.5–50.0)
HEMOGLOBIN: 13.5 g/dL (ref 13.2–17.1)
LYMPHS ABS: 3614 {cells}/uL (ref 850–3900)
MCH: 26.1 pg — ABNORMAL LOW (ref 27.0–33.0)
MCHC: 33 g/dL (ref 32.0–36.0)
MCV: 79 fL — ABNORMAL LOW (ref 80.0–100.0)
MONOS PCT: 6.5 %
MPV: 9 fL (ref 7.5–12.5)
NEUTROS ABS: 9475 {cells}/uL — AB (ref 1500–7800)
NEUTROS PCT: 65.8 %
Platelets: 408 10*3/uL — ABNORMAL HIGH (ref 140–400)
RBC: 5.18 10*6/uL (ref 4.20–5.80)
RDW: 14.6 % (ref 11.0–15.0)
Total Lymphocyte: 25.1 %
WBC mixed population: 936 cells/uL (ref 200–950)
WBC: 14.4 10*3/uL — AB (ref 3.8–10.8)

## 2017-05-15 LAB — HEMOGLOBIN A1C
HEMOGLOBIN A1C: 6 %{Hb} — AB (ref <5.7)
MEAN PLASMA GLUCOSE: 126 (calc)
eAG (mmol/L): 7 (calc)

## 2017-05-18 LAB — TESTOS,TOTAL,FREE AND SHBG (FEMALE)
Free Testosterone: 133.8 pg/mL (ref 35.0–155.0)
SEX HORMONE BINDING: 13 nmol/L (ref 10–50)
Testosterone, Total, LC-MS-MS: 544 ng/dL (ref 250–1100)

## 2017-05-18 LAB — PSA: PSA: 0.4 ng/mL (ref <=4.0)

## 2017-05-20 ENCOUNTER — Ambulatory Visit (INDEPENDENT_AMBULATORY_CARE_PROVIDER_SITE_OTHER): Payer: Medicaid Other | Admitting: "Endocrinology

## 2017-05-20 ENCOUNTER — Encounter: Payer: Self-pay | Admitting: "Endocrinology

## 2017-05-20 VITALS — BP 150/90 | HR 109 | Ht 69.0 in | Wt 394.0 lb

## 2017-05-20 DIAGNOSIS — R7303 Prediabetes: Secondary | ICD-10-CM

## 2017-05-20 DIAGNOSIS — I10 Essential (primary) hypertension: Secondary | ICD-10-CM

## 2017-05-20 DIAGNOSIS — E291 Testicular hypofunction: Secondary | ICD-10-CM | POA: Diagnosis not present

## 2017-05-20 MED ORDER — METFORMIN HCL 500 MG PO TABS: 500.0000 mg | ORAL_TABLET | Freq: Two times a day (BID) | ORAL | 2 refills | Status: DC

## 2017-05-20 MED ORDER — ENALAPRIL MALEATE 20 MG PO TABS: 20.0000 mg | ORAL_TABLET | Freq: Every day | ORAL | 5 refills | Status: DC

## 2017-05-20 NOTE — Progress Notes (Signed)
Subjective:    Patient ID: Allen Harding, male    DOB: 11/27/1995, PCP Rosita Fire, MD   Past Medical History:  Diagnosis Date  . ADHD (attention deficit hyperactivity disorder)   . Anxiety   . Autism disorder   . 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 Diabetes mellitus   . Hypertension   . Prediabetes    Past Surgical History:  Procedure Laterality Date  . CIRCUMCISION    . CIRCUMCISION REVISION    . FRENULECTOMY, LINGUAL    . lipoma    . ORCHIOPEXY     Social History   Socioeconomic History  . Marital status: Single    Spouse name: None  . Number of children: None  . Years of education: None  . Highest education level: None  Social Needs  . Financial resource strain: None  . Food insecurity - worry: None  . Food insecurity - inability: None  . Transportation needs - medical: None  . Transportation needs - non-medical: None  Occupational History  . None  Tobacco Use  . Smoking status: Former Smoker    Packs/day: 0.25    Last attempt to quit: 01/09/2014    Years since quitting: 3.3  . Smokeless tobacco: Never Used  Substance and Sexual Activity  . Alcohol use: No  . Drug use: No  . Sexual activity: None  Other Topics Concern  . None  Social History Narrative   Lives with mom, sister, 2 nieces, grandparents and sister's fiance.    Outpatient Encounter Medications as of 05/20/2017  Medication Sig  . atomoxetine (STRATTERA) 40 MG capsule Take 80 mg by mouth daily.  . cetirizine (ZYRTEC) 10 MG tablet TAKE ONE TABLET BY MOUTH DAILY.  . chlorthalidone (HYGROTON) 25 MG tablet Take 50 mg by mouth.  . Cholecalciferol (VITAMIN D3) 5000 units CAPS Take 1 capsule (5,000 Units total) by mouth daily.  . enalapril (VASOTEC) 20 MG tablet Take 1 tablet (20 mg total) by mouth daily.  . ferrous sulfate 325 (65 FE) MG tablet Take 1 tablet (325 mg total) by mouth daily with breakfast.  . guanFACINE (INTUNIV) 2 MG TB24 Take 2 mg by mouth daily.   Marland Kitchen  letrozole (FEMARA) 2.5 MG tablet Take 1 tablet (2.5 mg total) by mouth daily.  . metFORMIN (GLUCOPHAGE) 500 MG tablet Take 1 tablet (500 mg total) by mouth 2 (two) times daily with a meal.  . Metoprolol Tartrate 75 MG TABS Take 75 mg by mouth 2 (two) times daily.  Marland Kitchen omeprazole (PRILOSEC) 40 MG capsule Take 1 capsule (40 mg total) by mouth 2 (two) times daily.  Marland Kitchen oxybutynin (DITROPAN-XL) 10 MG 24 hr tablet TAKE 1 TABLET BY MOUTH DAILY.  Marland Kitchen sertraline (ZOLOFT) 25 MG tablet Take 100 mg by mouth daily. Reported on 06/16/2015  . SYRINGE-NEEDLE, DISP, 3 ML (BD ECLIPSE SYRINGE) 21G X 1" 3 ML MISC by Does not apply route.  Marland Kitchen testosterone cypionate (DEPOTESTOSTERONE CYPIONATE) 200 MG/ML injection Inject 1 mL (200 mg total) into the muscle every 14 (fourteen) days.  . traZODone (DESYREL) 100 MG tablet Take 100 mg by mouth at bedtime. Reported on 06/16/2015  . [DISCONTINUED] enalapril (VASOTEC) 10 MG tablet Take 1 tablet (10 mg total) by mouth daily.  . [DISCONTINUED] metFORMIN (GLUCOPHAGE) 1000 MG tablet TAKE (1) TABLET BY MOUTH TWICE DAILY WITH FOOD FOR DIABETES.   No facility-administered encounter medications on file as of 05/20/2017.    ALLERGIES: No Known Allergies VACCINATION STATUS:  Immunization History  Administered Date(s) Administered  . DTaP 06/11/1995, 08/06/1995, 09/24/1995, 06/30/1996, 09/24/1999  . H1N1 11/24/2008  . HPV Quadrivalent 02/08/2014  . Hepatitis A, Ped/Adol-2 Dose 02/03/2013, 08/19/2013  . Hepatitis B 02/04/1995, 05/07/1995, 09/24/1995  . HiB (PRP-OMP) 06/11/1995, 08/06/1995, 09/24/1995, 06/30/1996  . IPV 06/11/1995, 08/06/1995, 09/24/1995, 09/24/1999  . Influenza Whole 03/22/2009, 06/17/2011, 07/23/2012  . Influenza,inj,Quad PF,6+ Mos 05/02/2015  . MMR 03/31/1996, 09/24/1999  . Meningococcal Conjugate 11/24/2008, 08/19/2013  . Td 01/07/2006  . Tdap 01/07/2006  . Varicella 03/31/1996, 06/01/1997    HPI 21 year old male patient with medical history as above. 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 - He has been following at [facility] until March 2017 with Dr. Baldo Ash. -He does not know for sure when he started testosterone therapy currently at 200 mg IM every 2 weeks. - He has been more consistent  in his testosterone injection since his last visit,  causing his total testosterone to to improve to 544 from  147 .  -He has dealt with heavy weight almost all of his life,  no success on weight loss. - Over the years he was diagnosed with prediabetes for which he was initiated on metformin 1000 mg by mouth twice a day. His most recent A1c is 6.1%. -He has a polypharmacy, see below. -He has no acute complaints today. -He underwent orchidopexy for right-sided undescended testes. -He denies testicular injury, radiation, infection, STD. -He denies any history of head injury.   Review of Systems Constitutional: + heavy weight, + fatigue, no subjective hyperthermia/hypothermia Eyes: no blurry vision, no xerophthalmia ENT: no sore throat, no nodules palpated in throat, no dysphagia/odynophagia, no hoarseness Cardiovascular: no CP/SOB/palpitations/leg swelling Respiratory: no cough/SOB Gastrointestinal: no N/V/D/C Musculoskeletal: no muscle/joint aches Skin: no rashes Neurological: no tremors/numbness/tingling/dizziness Psychiatric: + depression/anxiety  Objective:    BP (!) 150/90   Pulse (!) 109   Ht '5\' 9"'  (1.753 m)   Wt (!) 394 lb (178.7 kg)   BMI 58.18 kg/m   Wt Readings from Last 3 Encounters:  05/20/17 (!) 394 lb (178.7 kg)  04/01/17 (!) 395 lb 12.8 oz (179.5 kg)  03/17/17 (!) 394 lb (178.7 kg)    Physical Exam Constitutional: Obese, in NAD Eyes: PERRLA, EOMI, no exophthalmos ENT: moist mucous membranes, no thyromegaly, no cervical lymphadenopathy Cardiovascular: RRR, No MRG Respiratory: CTA B Gastrointestinal: abdomen soft, NT, ND,  BS+ Genital exam : Deferred, due to patient preference. Musculoskeletal: no deformities, strength intact in all 4 Skin: moist, warm, no rashes, he has scant mustache and hair on his chin. Neurological: no tremor with outstretched hands, DTR normal in all 4  CMP ( most recent) CMP     Component Value Date/Time   NA 139 03/17/2017 0923   K 3.7 03/17/2017 0923   CL 101 03/17/2017 0923   CO2 29 03/17/2017 0923   GLUCOSE 121 (H) 03/17/2017 0923   BUN 8 03/17/2017 0923   CREATININE 0.83 03/17/2017 0923   CREATININE 0.74 09/18/2016 1415   CALCIUM 9.2 03/17/2017 0923   PROT 7.5 03/17/2017 0923   ALBUMIN 3.5 03/17/2017 0923   AST 18 03/17/2017 0923   ALT 22 03/17/2017 0923   ALKPHOS 108 03/17/2017 0923   BILITOT 0.4 03/17/2017 0923   GFRNONAA >60 03/17/2017 0923   GFRNONAA >89 03/15/2016 1216   GFRAA >60 03/17/2017 0923   GFRAA >89 03/15/2016 1216   Diabetic Labs (most recent): Lab Results  Component Value Date   HGBA1C  6.0 (H) 05/14/2017   HGBA1C 6.1 (H) 09/18/2016   HGBA1C 6.0 (H) 03/15/2016    Lipid Panel     Component Value Date/Time   CHOL 222 (H) 08/08/2015 1152   TRIG 105 08/08/2015 1152   HDL 46 08/08/2015 1152   CHOLHDL 4.8 08/08/2015 1152   VLDL 21 08/08/2015 1152   LDLCALC 155 (H) 08/08/2015 1152    Recent Results (from the past 2160 hour(s))  CBC with Differential     Status: Abnormal   Collection Time: 03/17/17  9:23 AM  Result Value Ref Range   WBC 14.7 (H) 4.0 - 10.5 K/uL   RBC 5.21 4.22 - 5.81 MIL/uL   Hemoglobin 14.0 13.0 - 17.0 g/dL   HCT 44.0 39.0 - 52.0 %   MCV 84.5 78.0 - 100.0 fL   MCH 26.9 26.0 - 34.0 pg   MCHC 31.8 30.0 - 36.0 g/dL   RDW 14.1 11.5 - 15.5 %   Platelets 394 150 - 400 K/uL   Neutrophils Relative % 72 %   Neutro Abs 10.5 (H) 1.7 - 7.7 K/uL   Lymphocytes Relative 20 %   Lymphs Abs 3.0 0.7 - 4.0 K/uL   Monocytes Relative 6 %   Monocytes Absolute 0.9 0.1 - 1.0 K/uL   Eosinophils Relative 2 %   Eosinophils Absolute 0.3 0.0 -  0.7 K/uL   Basophils Relative 0 %   Basophils Absolute 0.0 0.0 - 0.1 K/uL  Comprehensive metabolic panel     Status: Abnormal   Collection Time: 03/17/17  9:23 AM  Result Value Ref Range   Sodium 139 135 - 145 mmol/L   Potassium 3.7 3.5 - 5.1 mmol/L   Chloride 101 101 - 111 mmol/L   CO2 29 22 - 32 mmol/L   Glucose, Bld 121 (H) 65 - 99 mg/dL   BUN 8 6 - 20 mg/dL   Creatinine, Ser 0.83 0.61 - 1.24 mg/dL   Calcium 9.2 8.9 - 10.3 mg/dL   Total Protein 7.5 6.5 - 8.1 g/dL   Albumin 3.5 3.5 - 5.0 g/dL   AST 18 15 - 41 U/L   ALT 22 17 - 63 U/L   Alkaline Phosphatase 108 38 - 126 U/L   Total Bilirubin 0.4 0.3 - 1.2 mg/dL   GFR calc non Af Amer >60 >60 mL/min   GFR calc Af Amer >60 >60 mL/min    Comment: (NOTE) The eGFR has been calculated using the CKD EPI equation. This calculation has not been validated in all clinical situations. eGFR's persistently <60 mL/min signify possible Chronic Kidney Disease.    Anion gap 9 5 - 15  BCR-ABL1, CML/ALL, PCR, QUANT     Status: None   Collection Time: 03/17/17  9:23 AM  Result Value Ref Range   b2a2 transcript Comment %    Comment: (NOTE)           <0.0032 % (sensitivity limit of assay)    b3a2 transcript Comment %    Comment: (NOTE)           <0.0032 % (sensitivity limit of assay)    E1A2 Transcript Comment %    Comment: (NOTE)           <0.0032 % (sensitivity limit of assay)    Interpretation (BCRAL): Comment     Comment: (NOTE) NEGATIVE for the BCR-ABL1 e1a2 (p190), e13a2 (b2a2, p210) and e14a2 (b3a2, p210) fusion transcripts. These results do not rule out the presence of rare BCR-ABL1 transcripts not detected  by this assay.    [facility]): Comment     Comment: (NOTE) Katina Degree, MD, PhD Director, [facility] [place],  [phone]    Background: Comment     Comment: (NOTE) This assay can detect three different types of  BCR-ABL1 fusion transcripts associated with CML, ALL, and AML: e13a2 (previously b2a2) and e14a2 (previously b3a2) (major breakpoint, p210), as well as e1a2 (minor breakpoint, p190). The e13a2 and e14a2 transcript values are titrated to the current International Scale (IS). The standardized baseline is 100% BCR-ABL1 (IS) and major molecular response (MMR) is equivalent to 0.1% BCR-ABL1 (IS) corresponding to a 3-log reduction. Results should be correlated with appropriate clinical and laboratory information as indicated.    Methodology Comment     Comment: (NOTE) Total RNA is isolated from the sample and subject to a real-time, reverse transcriptase polymerase chain reaction (RT-PCR). The PCR primers and probes are specific for BCR-ABL1 e13a2, e14a2 and e1a2 fusion transcripts. The ABL1 transcript is amplified as the control for cDNA quantity and quality. Serial dilutions of a validated positive control RNA with known t(9;22) BCR-ABL1 are used as reference for quantification of BCR-ABL1 relative to ABL1. The numeric BCR-ABL1 level is reportd as % BCR-ABL1/ABL1 and the detection sensitivity is 4.5 log below the standard baseline. This test was developed and its performance characteristics determined by [facility]. It has not been cleared or approved by the Food and Drug Administration. References:    1. Anastasia Fiedler and Branford S: Seminars in Hematology 2003;       40 (suppl2):62-68.    2. White HE, et al. Blood 2010; 116: e111-117.    3. NCCN Clinical Practice Guidelines in Oncology, Chronic       Myeloid Leukemia. V2. 2017.  Performed At: [facility] [address] [place], [place] [id number] Nechama Guard MD JJ:[id number] Performed At: [facility] [address] [place], [place] [id number] Nechama Guard MD FT:[id number]   PSA     Status: None   Collection Time: 05/13/17 12:23 PM  Result Value Ref Range   PSA 0.4 < OR = 4.0 ng/mL    Comment: The total PSA  value from this assay system is  standardized against the WHO standard. The test  result will be approximately 20% lower when compared  to the equimolar-standardized total PSA (Beckman  Coulter). Comparison of serial PSA results should be  interpreted with this fact in mind. . This test was performed using the Siemens  chemiluminescent method. Values obtained from  different assay methods cannot be used interchangeably. PSA levels, regardless of value, should not be interpreted as absolute evidence of the presence or absence of disease.   Testos,Total,Free and SHBG (Male)     Status: None   Collection Time: 05/13/17 12:23 PM  Result Value Ref Range   Testosterone, Total, LC-MS-MS 544 250 - 1,100 ng/dL    Comment: . For additional information, please refer to http://education.questdiagnostics.com/faq/ TotalTestosteroneLCMSMSFAQ165 (This link is being provided for informational/ educational purposes only.) . This test was developed and its analytical performance characteristics have been determined by [place], [place]. It has not been cleared or approved by the U.S. Food and Drug Administration. This assay has been validated pursuant to the CLIA regulations and is used for clinical purposes. .    Free Testosterone 133.8 35.0 - 155.0 pg/mL    Comment: . This test was developed and its analytical  performance characteristics have been determined by [place], [place]. It has not been cleared or approved by the U.S. Food and Drug Administration. This assay has been validated pursuant to the CLIA regulations and is used for clinical purposes. .    Sex Hormone Binding 13 10 - 50 nmol/L  Hemoglobin A1c     Status: Abnormal   Collection Time: 05/14/17 11:04 AM  Result Value Ref Range   Hgb A1c MFr Bld 6.0 (H) <5.7 % of total Hgb    Comment: For someone without known diabetes, a hemoglobin  A1c value between  5.7% and 6.4% is consistent with prediabetes and should be confirmed with a  follow-up test. . For someone with known diabetes, a value <7% indicates that their diabetes is well controlled. A1c targets should be individualized based on duration of diabetes, age, comorbid conditions, and other considerations. . This assay result is consistent with an increased risk of diabetes. . Currently, no consensus exists regarding use of hemoglobin A1c for diagnosis of diabetes for children. .    Mean Plasma Glucose 126 (calc)   eAG (mmol/L) 7.0 (calc)  CBC with Differential/Platelet     Status: Abnormal   Collection Time: 05/14/17 11:04 AM  Result Value Ref Range   WBC 14.4 (H) 3.8 - 10.8 Thousand/uL   RBC 5.18 4.20 - 5.80 Million/uL   Hemoglobin 13.5 13.2 - 17.1 g/dL   HCT 40.9 38.5 - 50.0 %   MCV 79.0 (L) 80.0 - 100.0 fL   MCH 26.1 (L) 27.0 - 33.0 pg   MCHC 33.0 32.0 - 36.0 g/dL   RDW 14.6 11.0 - 15.0 %   Platelets 408 (H) 140 - 400 Thousand/uL   MPV 9.0 7.5 - 12.5 fL   Neutro Abs 9,475 (H) 1,500 - 7,800 cells/uL   Lymphs Abs 3,614 850 - 3,900 cells/uL   WBC mixed population 936 200 - 950 cells/uL   Eosinophils Absolute 302 15 - 500 cells/uL   Basophils Absolute 72 0 - 200 cells/uL   Neutrophils Relative % 65.8 %   Total Lymphocyte 25.1 %   Monocytes Relative 6.5 %   Eosinophils Relative 2.1 %   Basophils Relative 0.5 %     Assessment & Plan:   1. Pre-diabetes - I have reviewed his history and evaluated patient clinically. His prediabetes is directly linked to his obesity, see #3 below. He is A1c remains at 6%- consistent with good control. - I advised him to  lower metformin to 500 mg by mouth twice a day.   2. Hypogonadism male - Etiology most likely multifactorial including chromosomal abnormality and history of undescended testes.  - He has required testosterone supplement for several years now. I have reviewed his EMR records and found out that he had low testosterone  starting from at least 2012.  -I have not seen FSH/LH levels, would have  been useful prior to initiation of testosterone therapy however the utility of that test now is unremarkable. 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 - His Testosterone level at  544 is acceptable, I advised him to continue testosterone 200 mg IM every 2 weeks.  -I will obtain total testosterone before his next visit.   3. Morbid obesity due to excess calories ([place]) - See #1 above. - The most likely cause of his morbid obesity is still excess caloric intake.  Cushing's syndrome, hypothyroidism are ruled out as a cause of weight gain. - I have spent 30 minutes counseling him on  diet specifically talked about ways to cut carb consumption and increasing protein intake as necessary. - Consult with dietitian/diabetes educator Jearld Fenton, CDE in progress. - His mother gathered information on bariatric surgery, however his insurance would not cover.   4. Essential hypertension, benign - unontrolled.  He has 3 medications for blood pressure including enalapril 10 mg by mouth daily, metoprolol 75 mg by mouth daily, chlorthalidone 25 mg by mouth daily. He could not confirm consistency of taking his medications. I will proceed to increase enalapril to 20 minute grams by mouth daily. I advised him to be consistent taking his medications and follow-up with Dr. Legrand Rams for primary care needs.  5. Undescended right testicle - He is status post orchidopexy of right testicle. The details of his surgical history are not available for review.  Follow up plan: Return in about 4 months (around 09/18/2017) for follow up with pre-visit labs.  Glade Lloyd, MD Phone: [phone]  Fax: [phone]   This note was partially dictated with voice recognition software. Similar sounding words can be transcribed inadequately or may not  be corrected upon review.  05/20/2017, 12:15 PM

## 2017-05-20 NOTE — Patient Instructions (Signed)

## 2017-09-11 ENCOUNTER — Other Ambulatory Visit: Payer: Self-pay | Admitting: "Endocrinology

## 2017-09-12 LAB — TESTOSTERONE TOTAL,FREE,BIO, MALES
Albumin: 3.9 g/dL (ref 3.6–5.1)
Sex Hormone Binding: 16 nmol/L (ref 10–50)
Testosterone, Bioavailable: 135.6 ng/dL (ref >=110.0)
Testosterone, Free: 75.5 pg/mL (ref 46.0–224.0)
Testosterone: 327 ng/dL (ref 250–827)

## 2017-09-18 ENCOUNTER — Encounter: Payer: Self-pay | Admitting: "Endocrinology

## 2017-09-18 ENCOUNTER — Ambulatory Visit: Payer: Medicaid Other | Admitting: "Endocrinology

## 2017-09-18 ENCOUNTER — Other Ambulatory Visit (HOSPITAL_COMMUNITY): Payer: Self-pay | Admitting: *Deleted

## 2017-09-18 VITALS — BP 158/99 | HR 92 | Ht 69.0 in | Wt 396.0 lb

## 2017-09-18 DIAGNOSIS — I10 Essential (primary) hypertension: Secondary | ICD-10-CM | POA: Diagnosis not present

## 2017-09-18 DIAGNOSIS — E291 Testicular hypofunction: Secondary | ICD-10-CM

## 2017-09-18 DIAGNOSIS — D72829 Elevated white blood cell count, unspecified: Secondary | ICD-10-CM

## 2017-09-18 DIAGNOSIS — R7303 Prediabetes: Secondary | ICD-10-CM

## 2017-09-18 NOTE — Progress Notes (Signed)
Subjective:    Patient ID: Allen Harding, male    DOB: 1995/10/19, PCP Rosita Fire, MD   Past Medical History:  Diagnosis Date  . ADHD (attention deficit hyperactivity disorder)   . Anxiety   . Autism disorder   . 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 Diabetes mellitus   . Hypertension   . Prediabetes    Past Surgical History:  Procedure Laterality Date  . CIRCUMCISION    . CIRCUMCISION REVISION    . FRENULECTOMY, LINGUAL    . lipoma    . ORCHIOPEXY     Social History   Socioeconomic History  . Marital status: Single    Spouse name: Not on file  . Number of children: Not on file  . Years of education: Not on file  . Highest education level: Not on file  Occupational History  . Not on file  Social Needs  . Financial resource strain: Not on file  . Food insecurity:    Worry: Not on file    Inability: Not on file  . Transportation needs:    Medical: Not on file    Non-medical: Not on file  Tobacco Use  . Smoking status: Former Smoker    Packs/day: 0.25    Last attempt to quit: 01/09/2014    Years since quitting: 3.6  . Smokeless tobacco: Never Used  Substance and Sexual Activity  . Alcohol use: No  . Drug use: No  . Sexual activity: Not on file  Lifestyle  . Physical activity:    Days per week: Not on file    Minutes per session: Not on file  . Stress: Not on file  Relationships  . Social connections:    Talks on phone: Not on file    Gets together: Not on file    Attends religious service: Not on file    Active member of club or organization: Not on file    Attends meetings of clubs or organizations: Not on file    Relationship status: Not on file  Other Topics Concern  . Not on file  Social History Narrative   Lives with mom, sister, 2 nieces, grandparents and sister's fiance.    Outpatient Encounter Medications as of 09/18/2017  Medication Sig  . escitalopram (LEXAPRO) 10 MG tablet Take 10 mg by mouth daily.  Marland Kitchen  atomoxetine (STRATTERA) 40 MG capsule Take 80 mg by mouth daily.  . cetirizine (ZYRTEC) 10 MG tablet TAKE ONE TABLET BY MOUTH DAILY.  . chlorthalidone (HYGROTON) 25 MG tablet Take 50 mg by mouth.  . Cholecalciferol (VITAMIN D3) 5000 units CAPS Take 1 capsule (5,000 Units total) by mouth daily.  . enalapril (VASOTEC) 20 MG tablet Take 1 tablet (20 mg total) by mouth daily.  . ferrous sulfate 325 (65 FE) MG tablet Take 1 tablet (325 mg total) by mouth daily with breakfast.  . guanFACINE (INTUNIV) 2 MG TB24 Take 2 mg by mouth daily.   Marland Kitchen letrozole (FEMARA) 2.5 MG tablet Take 1 tablet (2.5 mg total) by mouth daily.  . metFORMIN (GLUCOPHAGE) 500 MG tablet Take 1 tablet (500 mg total) by mouth 2 (two) times daily with a meal.  . Metoprolol Tartrate 75 MG TABS Take 75 mg by mouth 2 (two) times daily.  Marland Kitchen omeprazole (PRILOSEC) 40 MG capsule Take 1 capsule (40 mg total) by mouth 2 (two) times daily.  Marland Kitchen oxybutynin (DITROPAN-XL) 10 MG 24 hr tablet TAKE 1 TABLET BY MOUTH DAILY.  Marland Kitchen  sertraline (ZOLOFT) 25 MG tablet Take 100 mg by mouth daily. Reported on 06/16/2015  . SYRINGE-NEEDLE, DISP, 3 ML (BD ECLIPSE SYRINGE) 21G X 1" 3 ML MISC by Does not apply route.  Marland Kitchen testosterone cypionate (DEPOTESTOSTERONE CYPIONATE) 200 MG/ML injection Inject 1 mL (200 mg total) into the muscle every 14 (fourteen) days.  . traZODone (DESYREL) 100 MG tablet Take 100 mg by mouth at bedtime. Reported on 06/16/2015  . [DISCONTINUED] oxybutynin (DITROPAN) 5 MG tablet Take 10 mg by mouth Nightly.    No facility-administered encounter medications on file as of 09/18/2017.    ALLERGIES: No Known Allergies VACCINATION STATUS: Immunization History  Administered Date(s) Administered  . DTaP 06/11/1995, 08/06/1995, 09/24/1995, 06/30/1996, 09/24/1999  . H1N1 11/24/2008  . HPV Quadrivalent 02/08/2014  . Hepatitis A, Ped/Adol-2 Dose 02/03/2013, 08/19/2013  . Hepatitis B 08-Apr-1995, 05/07/1995, 09/24/1995  . HiB (PRP-OMP) 06/11/1995,  08/06/1995, 09/24/1995, 06/30/1996  . IPV 06/11/1995, 08/06/1995, 09/24/1995, 09/24/1999  . Influenza Whole 03/22/2009, 06/17/2011, 07/23/2012  . Influenza,inj,Quad PF,6+ Mos 05/02/2015  . MMR 03/31/1996, 09/24/1999  . Meningococcal Conjugate 11/24/2008, 08/19/2013  . Td 01/07/2006  . Tdap 01/07/2006  . Varicella 03/31/1996, 06/01/1997    HPI 22 year old male patient with medical history as above. 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 - He has been following at [facility] until March 2017 with Dr. Baldo Ash. -He does not know for sure when he started testosterone therapy currently at 200 mg IM every 2 weeks. -Lately, he is more consistent in his testosterone injection.  -He has dealt with heavy weight almost all of his life,  no success on weight loss.  Admits to dietary indiscretion including consumption of large quantities of soda. - Over the years he was diagnosed with prediabetes for which he was initiated on metformin , currently on 500 mg p.o. twice daily.    His most recent A1c is 6%. -He has a polypharmacy, see below. -He has no acute complaints today. -He underwent orchidopexy for right-sided undescended testes. -He denies testicular injury, radiation, infection, STD. -He denies any history of head injury.   Review of Systems Constitutional: + heavy weight, + fatigue, no subjective hyperthermia/hypothermia Eyes: no blurry vision, no xerophthalmia ENT: no sore throat, no nodules palpated in throat, no dysphagia/odynophagia, no hoarseness Cardiovascular: no chest pain, no palpitations.   Respiratory: no cough/SOB Gastrointestinal: no N/V/D/C Musculoskeletal: no muscle/joint aches Skin: no rashes Neurological: no tremors/numbness/tingling/dizziness Psychiatric: + depression/anxiety  Objective:    BP (!) 158/99   Pulse 92   Ht '5\' 9"'  (1.753 m)   Wt (!) 396 lb (179.6 kg)    BMI 58.48 kg/m   Wt Readings from Last 3 Encounters:  09/18/17 (!) 396 lb (179.6 kg)  05/20/17 (!) 394 lb (178.7 kg)  04/01/17 (!) 395 lb 12.8 oz (179.5 kg)    Physical Exam Constitutional: Morbidly obese, alert and oriented x3, not in acute distress, poor personal hygiene. Eyes: PERRLA, EOMI, no exophthalmos ENT: moist mucous membranes, no thyromegaly, no cervical lymphadenopathy Genital exam : Deferred, due to patient preference. Musculoskeletal: no deformities, strength intact in all 4 Skin: moist, warm, no rashes, he has scant mustache and hair on his chin. Neurological: no tremor with outstretched hands, DTR normal in all 4  CMP     Component Value Date/Time   NA 139 03/17/2017 0923   K 3.7 03/17/2017 0923   CL 101 03/17/2017 0923   CO2 29 03/17/2017 0923   GLUCOSE 121 (H) 03/17/2017  0923   BUN 8 03/17/2017 0923   CREATININE 0.83 03/17/2017 0923   CREATININE 0.74 09/18/2016 1415   CALCIUM 9.2 03/17/2017 0923   PROT 7.5 03/17/2017 0923   ALBUMIN 3.5 03/17/2017 0923   AST 18 03/17/2017 0923   ALT 22 03/17/2017 0923   ALKPHOS 108 03/17/2017 0923   BILITOT 0.4 03/17/2017 0923   GFRNONAA >60 03/17/2017 0923   GFRNONAA >89 03/15/2016 1216   GFRAA >60 03/17/2017 0923   GFRAA >89 03/15/2016 1216   Diabetic Labs (most recent): Lab Results  Component Value Date   HGBA1C 6.0 (H) 05/14/2017   HGBA1C 6.1 (H) 09/18/2016   HGBA1C 6.0 (H) 03/15/2016    Lipid Panel     Component Value Date/Time   CHOL 222 (H) 08/08/2015 1152   TRIG 105 08/08/2015 1152   HDL 46 08/08/2015 1152   CHOLHDL 4.8 08/08/2015 1152   VLDL 21 08/08/2015 1152   LDLCALC 155 (H) 08/08/2015 1152    Recent Results (from the past 2160 hour(s))  Testosterone Total,Free,Bio, Males     Status: None   Collection Time: 09/11/17 12:45 PM  Result Value Ref Range   Testosterone 327 250 - 827 ng/dL   Albumin 3.9 3.6 - 5.1 g/dL   Sex Hormone Binding 16 10 - 50 nmol/L   Testosterone, Free 75.5 46.0 - 224.0  pg/mL   Testosterone, Bioavailable 135.6 110.0 - 575 ng/dL     Assessment & Plan:   1. Pre-diabetes - I have reviewed his history and evaluated patient clinically. His prediabetes is directly linked to his obesity, see #3 below. He is A1c remains at 6%- consistent with good control. - I advised him to continue  metformin to 500 mg by mouth twice a day.   2. Hypogonadism male - Etiology most likely multifactorial including chromosomal abnormality and history of undescended testes.  - He has required testosterone supplement for several years now. I have reviewed his EMR records and found out that he had low testosterone starting from at least 2012.  -I have not seen FSH/LH levels, would have  been useful prior to initiation of testosterone therapy however the utility of that test now is unremarkable. 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 - His previsit testosterone level is 327.  This is an acceptable level, I advised him to continue testosterone 200 mg IM every 2 weeks.   -I will obtain total testosterone before his next visit.   3. Morbid obesity due to excess calories ([place]) - See #1 above. - The most likely cause of his morbid obesity is still excess caloric intake.  Cushing's syndrome, hypothyroidism are ruled out as a cause of weight gain. -  Suggestion is made for him to avoid simple carbohydrates  from his diet including Cakes, Sweet Desserts / Pastries, Ice Cream, Soda (diet and regular), Sweet Tea, Candies, Chips, Cookies, Store Bought Juices, Alcohol in Excess of  1-2 drinks a day, Artificial Sweeteners, and "Sugar-free" Products. This will help patient to have stable blood glucose profile and potentially avoid unintended weight gain.  - Consult with dietitian/diabetes educator Jearld Fenton, CDE in progress. - His mother gathered information on bariatric surgery, however his insurance would not cover.   4. Essential hypertension, benign - uncontrolled.  He has 3  medications for blood pressure including enalapril 10 mg by mouth daily, metoprolol 75 mg by mouth daily, chlorthalidone 25 mg by mouth daily. He could not confirm consistency of taking his medications. I will proceed to increase enalapril to  20 minute grams by mouth daily. I advised him to be consistent taking his medications and follow-up with Dr. Legrand Rams for primary care needs.  5. Undescended right testicle - He is status post orchidopexy of right testicle. The details of his surgical history are not available for review.  Follow up plan: Return in about 4 months (around 01/18/2018) for follow up with pre-visit labs.  Glade Lloyd, MD Phone: [phone]  Fax: [phone]   This note was partially dictated with voice recognition software. Similar sounding words can be transcribed inadequately or may not  be corrected upon review.  09/18/2017, 5:16 PM

## 2017-09-18 NOTE — Patient Instructions (Signed)

## 2017-09-19 ENCOUNTER — Ambulatory Visit: Payer: Self-pay | Admitting: "Endocrinology

## 2017-09-22 ENCOUNTER — Inpatient Hospital Stay (HOSPITAL_COMMUNITY): Payer: Medicaid Other | Attending: Hematology

## 2017-09-22 DIAGNOSIS — D72829 Elevated white blood cell count, unspecified: Secondary | ICD-10-CM

## 2017-09-22 DIAGNOSIS — I10 Essential (primary) hypertension: Secondary | ICD-10-CM | POA: Insufficient documentation

## 2017-09-22 DIAGNOSIS — K760 Fatty (change of) liver, not elsewhere classified: Secondary | ICD-10-CM | POA: Diagnosis not present

## 2017-09-22 DIAGNOSIS — E119 Type 2 diabetes mellitus without complications: Secondary | ICD-10-CM | POA: Insufficient documentation

## 2017-09-22 DIAGNOSIS — E669 Obesity, unspecified: Secondary | ICD-10-CM | POA: Insufficient documentation

## 2017-09-22 LAB — COMPREHENSIVE METABOLIC PANEL
ALK PHOS: 90 U/L (ref 38–126)
ALT: 26 U/L (ref 17–63)
ANION GAP: 13 (ref 5–15)
AST: 23 U/L (ref 15–41)
Albumin: 3.5 g/dL (ref 3.5–5.0)
BUN: 9 mg/dL (ref 6–20)
CALCIUM: 9.1 mg/dL (ref 8.9–10.3)
CHLORIDE: 100 mmol/L — AB (ref 101–111)
CO2: 26 mmol/L (ref 22–32)
CREATININE: 0.76 mg/dL (ref 0.61–1.24)
GFR calc non Af Amer: >60 mL/min (ref >60)
Glucose, Bld: 121 mg/dL — ABNORMAL HIGH (ref 65–99)
Potassium: 3.5 mmol/L (ref 3.5–5.1)
Sodium: 139 mmol/L (ref 135–145)
Total Bilirubin: 0.7 mg/dL (ref 0.3–1.2)
Total Protein: 7.9 g/dL (ref 6.5–8.1)

## 2017-09-22 LAB — CBC WITH DIFFERENTIAL/PLATELET
Basophils Absolute: 0 10*3/uL (ref 0.0–0.1)
Basophils Relative: 0 %
EOS ABS: 0.3 10*3/uL (ref 0.0–0.7)
EOS PCT: 2 %
HCT: 44.6 % (ref 39.0–52.0)
Hemoglobin: 14.2 g/dL (ref 13.0–17.0)
LYMPHS ABS: 2.7 10*3/uL (ref 0.7–4.0)
LYMPHS PCT: 19 %
MCH: 26.5 pg (ref 26.0–34.0)
MCHC: 31.8 g/dL (ref 30.0–36.0)
MCV: 83.4 fL (ref 78.0–100.0)
MONOS PCT: 6 %
Monocytes Absolute: 0.8 10*3/uL (ref 0.1–1.0)
Neutro Abs: 10.2 10*3/uL — ABNORMAL HIGH (ref 1.7–7.7)
Neutrophils Relative %: 73 %
PLATELETS: 389 10*3/uL (ref 150–400)
RBC: 5.35 MIL/uL (ref 4.22–5.81)
RDW: 14.5 % (ref 11.5–15.5)
WBC: 14.1 10*3/uL — AB (ref 4.0–10.5)

## 2017-09-29 ENCOUNTER — Ambulatory Visit (HOSPITAL_COMMUNITY): Payer: Self-pay

## 2017-09-29 ENCOUNTER — Inpatient Hospital Stay (HOSPITAL_BASED_OUTPATIENT_CLINIC_OR_DEPARTMENT_OTHER): Payer: Medicaid Other | Admitting: Internal Medicine

## 2017-09-29 ENCOUNTER — Encounter (HOSPITAL_COMMUNITY): Payer: Self-pay | Admitting: Internal Medicine

## 2017-09-29 ENCOUNTER — Other Ambulatory Visit: Payer: Self-pay

## 2017-09-29 VITALS — BP 187/120 | HR 110 | Resp 18 | Wt 396.3 lb

## 2017-09-29 DIAGNOSIS — I10 Essential (primary) hypertension: Secondary | ICD-10-CM | POA: Diagnosis not present

## 2017-09-29 DIAGNOSIS — E119 Type 2 diabetes mellitus without complications: Secondary | ICD-10-CM | POA: Diagnosis not present

## 2017-09-29 DIAGNOSIS — D72829 Elevated white blood cell count, unspecified: Secondary | ICD-10-CM | POA: Diagnosis not present

## 2017-09-29 DIAGNOSIS — K760 Fatty (change of) liver, not elsewhere classified: Secondary | ICD-10-CM

## 2017-09-29 NOTE — Progress Notes (Signed)
Diagnosis Leukocytosis, unspecified type - Plan: CBC with Differential/Platelet, Comprehensive metabolic panel, Lactate dehydrogenase, Sedimentation rate, C-reactive protein, JAK2 genotypr  Staging Cancer Staging No matching staging information was found for the patient.  Assessment and Plan:  1. Chronic leukocytosis with neutrophilic predominance.  Patient was previously followed by Dr. Talbert Cage.  He underwent Abdominal US on 03/24/2017 that was negative except for hepatic steatosis. He has been ruled out for CML. He was NEGATIVE for the BCR-ABL1 e1a2 (p190), e13a2 (b2a2, p210) and e14a2, (b3a2, p210) fusion transcripts.  Patient is not on steroids and no cause for neutrophilia has been found on his medication list.  He will return to clinic in 6 months with labs.  Suspect reactive process.  Pt denies smoking history.  If white count increases and approaches 20,000 bone marrow biopsy will be recommended for definitive diagnosis.  2.  Hypertension.  Blood pressure is 187/120. Pt should follow-up with his PCP for evaluation.  3.  Fatty liver.  This was seen on ultrasound done 03/24/2017.  Liver function tests on recent labs are within normal limits.  Likely related to obesity.  4.  Obesity.  Follow-up with PCP for recommendations.  Current Status: Pt seen today for follow-up.  He denies smoking.  Denies any new medications.  He has not taken steroids.   Problem List Patient Active Problem List   Diagnosis Date Noted  . Leukocytosis [D72.829] 04/01/2017  . Undescended right testicle [Q53.10] 12/21/2015  . Tachycardia [R00.0] 11/16/2015  . ADHD (attention deficit hyperactivity disorder) [F90.9]   . Allergic rhinitis [J30.9] 12/14/2014  . Iron deficiency anemia [D50.9] 09/29/2014  . Medication monitoring encounter [Z51.81] 09/06/2014  . Enuresis [R32] 08/20/2013  . Well child check [Z00.129] 08/20/2013  . Absence of bladder continence [R32] 08/20/2013  . Gynecomastia [N62] 07/21/2012  .  Hypogonadism male [E29.1] 03/03/2012  . 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 Dysthymia [F34.1] 10/29/2011  . 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 Pre-diabetes [R73.03] 10/25/2010  . Morbid obesity due to excess calories ([place]) [E66.01] 10/25/2010  . Essential hypertension, benign [I10] 10/25/2010  . Goiter, unspecified [E04.9] 10/25/2010  . Autism [F84.0] 10/25/2010  . Autism spectrum [F84.0] 10/25/2010    Past Medical History Past Medical History:  Diagnosis Date  . ADHD (attention deficit hyperactivity disorder)   . Anxiety   . Autism disorder   . 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 Diabetes mellitus   . Hypertension   . Prediabetes     Past Surgical History Past Surgical History:  Procedure Laterality Date  . CIRCUMCISION    . CIRCUMCISION REVISION    . FRENULECTOMY, LINGUAL    . lipoma    . ORCHIOPEXY      Family History Family History  Problem Relation Age of Onset  . Obesity Mother   . Diabetes Mother   . Hypertension Mother   . Hyperlipidemia Mother   . Obesity Sister   . Obesity Maternal Grandmother   . Diabetes Maternal Grandmother   . Cancer Maternal Grandmother   . Stroke Maternal Grandmother   . Obesity Maternal Grandfather      Social History  reports that he quit smoking about 3 years ago. He smoked 0.25 packs per day. He has never used smokeless tobacco. He reports that he does not drink alcohol or use drugs.  Medications  Current Outpatient Medications:  .  atomoxetine (STRATTERA) 40 MG capsule, Take 80 mg by mouth daily., Disp: , Rfl:  .  cetirizine (ZYRTEC)  10 MG tablet, TAKE ONE TABLET BY MOUTH DAILY., Disp: 30 tablet, Rfl: 0 .  chlorthalidone (HYGROTON) 25 MG tablet, Take 50 mg by mouth., Disp: , Rfl:  .  Cholecalciferol (VITAMIN D3) 5000 units CAPS, Take 1 capsule (5,000 Units total) by mouth daily., Disp: 90 capsule, Rfl: 0 .  enalapril (VASOTEC) 20 MG tablet, Take 1 tablet (20 mg total) by mouth  daily., Disp: 30 tablet, Rfl: 5 .  escitalopram (LEXAPRO) 10 MG tablet, Take 10 mg by mouth daily., Disp: , Rfl:  .  ferrous sulfate 325 (65 FE) MG tablet, Take 1 tablet (325 mg total) by mouth daily with breakfast., Disp: 30 tablet, Rfl: 3 .  guanFACINE (INTUNIV) 2 MG TB24, Take 2 mg by mouth daily. , Disp: , Rfl:  .  letrozole (FEMARA) 2.5 MG tablet, Take 1 tablet (2.5 mg total) by mouth daily., Disp: 30 tablet, Rfl: 3 .  metFORMIN (GLUCOPHAGE) 500 MG tablet, Take 1 tablet (500 mg total) by mouth 2 (two) times daily with a meal., Disp: 60 tablet, Rfl: 2 .  Metoprolol Tartrate 75 MG TABS, Take 75 mg by mouth 2 (two) times daily., Disp: 60 tablet, Rfl: 3 .  omeprazole (PRILOSEC) 40 MG capsule, Take 1 capsule (40 mg total) by mouth 2 (two) times daily., Disp: 60 capsule, Rfl: 6 .  oxybutynin (DITROPAN-XL) 10 MG 24 hr tablet, TAKE 1 TABLET BY MOUTH DAILY., Disp: 30 tablet, Rfl: 3 .  sertraline (ZOLOFT) 25 MG tablet, Take 100 mg by mouth daily. Reported on 06/16/2015, Disp: , Rfl:  .  SYRINGE-NEEDLE, DISP, 3 ML (BD ECLIPSE SYRINGE) 21G X 1" 3 ML MISC, by Does not apply route., Disp: , Rfl:  .  testosterone cypionate (DEPOTESTOSTERONE CYPIONATE) 200 MG/ML injection, Inject 1 mL (200 mg total) into the muscle every 14 (fourteen) days., Disp: 10 mL, Rfl: 2 .  traZODone (DESYREL) 100 MG tablet, Take 100 mg by mouth at bedtime. Reported on 06/16/2015, Disp: , Rfl:   Allergies Patient has no known allergies.  Review of Systems Review of Systems - Oncology ROS as per HPI otherwise 12 point ROS is negative.   Physical Exam  Vitals Wt Readings from Last 3 Encounters:  09/29/17 (!) 396 lb 4.8 oz (179.8 kg)  09/18/17 (!) 396 lb (179.6 kg)  05/20/17 (!) 394 lb (178.7 kg)   Temp Readings from Last 3 Encounters:  04/01/17 98.3 F (36.8 C) (Oral)  11/21/16 98.4 F (36.9 C) (Oral)  11/17/15 98.4 F (36.9 C) (Oral)   BP Readings from Last 3 Encounters:  09/29/17 (!) 187/120  09/18/17 (!) 158/99   05/20/17 (!) 150/90   Pulse Readings from Last 3 Encounters:  09/29/17 (!) 110  09/18/17 92  05/20/17 (!) 109   Constitutional: Well-developed, well-nourished, and in no distress.   HENT: Head: Normocephalic and atraumatic.  Mouth/Throat: No oropharyngeal exudate. Mucosa moist. Eyes: Pupils are equal, round, and reactive to light. Conjunctivae are normal. No scleral icterus.  Neck: Normal range of motion. Neck supple. No JVD present.  Cardiovascular: Normal rate, regular rhythm and normal heart sounds.  Exam reveals no gallop and no friction rub.   No murmur heard. Pulmonary/Chest: Effort normal and breath sounds normal. No respiratory distress. No wheezes.No rales.  Abdominal: Soft. Bowel sounds are normal. No distension. There is no tenderness. There is no guarding. Obese.   Musculoskeletal: No edema or tenderness.  Lymphadenopathy: No cervical, axillary or supraclavicular adenopathy.  Neurological: Alert and oriented to person, place, and time. No cranial  nerve deficit.  Skin: Skin is warm and dry. No rash noted. No erythema. No pallor.  Psychiatric: Affect and judgment normal.   Labs No visits with results within 3 Day(s) from this visit.  Latest known visit with results is:  Appointment on 09/22/2017  Component Date Value Ref Range Status  . WBC 09/22/2017 14.1* 4.0 - 10.5 K/uL Final  . RBC 09/22/2017 5.35  4.22 - 5.81 MIL/uL Final  . Hemoglobin 09/22/2017 14.2  13.0 - 17.0 g/dL Final  . HCT 09/22/2017 44.6  39.0 - 52.0 % Final  . MCV 09/22/2017 83.4  78.0 - 100.0 fL Final  . MCH 09/22/2017 26.5  26.0 - 34.0 pg Final  . MCHC 09/22/2017 31.8  30.0 - 36.0 g/dL Final  . RDW 09/22/2017 14.5  11.5 - 15.5 % Final  . Platelets 09/22/2017 389  150 - 400 K/uL Final  . Neutrophils Relative % 09/22/2017 73  % Final  . Neutro Abs 09/22/2017 10.2* 1.7 - 7.7 K/uL Final  . Lymphocytes Relative 09/22/2017 19  % Final  . Lymphs Abs 09/22/2017 2.7  0.7 - 4.0 K/uL Final  . Monocytes  Relative 09/22/2017 6  % Final  . Monocytes Absolute 09/22/2017 0.8  0.1 - 1.0 K/uL Final  . Eosinophils Relative 09/22/2017 2  % Final  . Eosinophils Absolute 09/22/2017 0.3  0.0 - 0.7 K/uL Final  . Basophils Relative 09/22/2017 0  % Final  . Basophils Absolute 09/22/2017 0.0  0.0 - 0.1 K/uL Final   Performed at [facility], [address], [place], [place] [address]  . Sodium 09/22/2017 139  135 - 145 mmol/L Final  . Potassium 09/22/2017 3.5  3.5 - 5.1 mmol/L Final  . Chloride 09/22/2017 100* 101 - 111 mmol/L Final  . CO2 09/22/2017 26  22 - 32 mmol/L Final  . Glucose, Bld 09/22/2017 121* 65 - 99 mg/dL Final  . BUN 09/22/2017 9  6 - 20 mg/dL Final  . Creatinine, Ser 09/22/2017 0.76  0.61 - 1.24 mg/dL Final  . Calcium 09/22/2017 9.1  8.9 - 10.3 mg/dL Final  . Total Protein 09/22/2017 7.9  6.5 - 8.1 g/dL Final  . Albumin 09/22/2017 3.5  3.5 - 5.0 g/dL Final  . AST 09/22/2017 23  15 - 41 U/L Final  . ALT 09/22/2017 26  17 - 63 U/L Final  . Alkaline Phosphatase 09/22/2017 90  38 - 126 U/L Final  . Total Bilirubin 09/22/2017 0.7  0.3 - 1.2 mg/dL Final  . GFR calc non Af Amer 09/22/2017 >60  >60 mL/min Final  . GFR calc Af Amer 09/22/2017 >60  >60 mL/min Final   Comment: (NOTE) The eGFR has been calculated using the CKD EPI equation. This calculation has not been validated in all clinical situations. eGFR's persistently <60 mL/min signify possible Chronic Kidney Disease.   Georgiann Hahn gap 09/22/2017 13  5 - 15 Final   Performed at [facility], [address], [place], [place] [address]     Pathology Orders Placed This Encounter  Procedures  . CBC with Differential/Platelet    Standing Status:   Future    Standing Expiration Date:   09/30/2018  . Comprehensive metabolic panel    Standing Status:   Future    Standing Expiration Date:   09/30/2018  . Lactate dehydrogenase    Standing Status:   Future    Standing Expiration Date:   09/30/2018  . Sedimentation rate    Standing Status:    Future  Standing Expiration Date:   09/30/2018  . C-reactive protein    Standing Status:   Future    Standing Expiration Date:   09/30/2018  . JAK2 genotypr    Standing Status:   Future    Standing Expiration Date:   09/30/2018       Zoila Shutter MD

## 2017-12-29 ENCOUNTER — Other Ambulatory Visit: Payer: Self-pay | Admitting: "Endocrinology

## 2017-12-29 ENCOUNTER — Telehealth: Payer: Self-pay

## 2017-12-29 DIAGNOSIS — E291 Testicular hypofunction: Secondary | ICD-10-CM

## 2017-12-29 MED ORDER — TESTOSTERONE CYPIONATE 200 MG/ML IM SOLN: 200.0000 mg | INTRAMUSCULAR | 0 refills | Status: DC

## 2017-12-29 NOTE — Telephone Encounter (Signed)
Yes, I will sent ina  Rx. He needs to keep his appointment with repeat labs.

## 2017-12-29 NOTE — Telephone Encounter (Signed)
Noted! Thank you

## 2017-12-29 NOTE — Telephone Encounter (Signed)
Please advised, patient is requesting a refill for his testosterone 200mg /ml.  Is it okay to refill? Last seen 09/18/17, Upcoming appointment for 01/22/18.   Wanted to verify with MD before submitting refill request.

## 2018-01-16 LAB — COMPLETE METABOLIC PANEL WITH GFR
AG Ratio: 1.2 (calc) (ref 1.0–2.5)
ALT: 23 U/L (ref 9–46)
AST: 18 U/L (ref 10–40)
Albumin: 3.5 g/dL — ABNORMAL LOW (ref 3.6–5.1)
Alkaline phosphatase (APISO): 86 U/L (ref 40–115)
BUN: 10 mg/dL (ref 7–25)
CALCIUM: 8.9 mg/dL (ref 8.6–10.3)
CO2: 24 mmol/L (ref 20–32)
Chloride: 106 mmol/L (ref 98–110)
Creat: 0.64 mg/dL (ref 0.60–1.35)
GFR, EST AFRICAN AMERICAN: 161 mL/min/{1.73_m2} (ref >=60)
GFR, EST NON AFRICAN AMERICAN: 139 mL/min/{1.73_m2} (ref >=60)
GLUCOSE: 164 mg/dL — AB (ref 65–139)
Globulin: 3 g/dL (calc) (ref 1.9–3.7)
Potassium: 4 mmol/L (ref 3.5–5.3)
Sodium: 138 mmol/L (ref 135–146)
TOTAL PROTEIN: 6.5 g/dL (ref 6.1–8.1)
Total Bilirubin: 0.3 mg/dL (ref 0.2–1.2)

## 2018-01-16 LAB — TESTOSTERONE TOTAL,FREE,BIO, MALES
Albumin: 3.5 g/dL — ABNORMAL LOW (ref 3.6–5.1)
Sex Hormone Binding: 13 nmol/L (ref 10–50)
Testosterone: 234 ng/dL — ABNORMAL LOW (ref 250–827)

## 2018-01-16 LAB — HEMOGLOBIN A1C
EAG (MMOL/L): 7 (calc)
Hgb A1c MFr Bld: 6 % of total Hgb — ABNORMAL HIGH (ref <5.7)
Mean Plasma Glucose: 126 (calc)

## 2018-01-22 ENCOUNTER — Ambulatory Visit (INDEPENDENT_AMBULATORY_CARE_PROVIDER_SITE_OTHER): Payer: Medicaid Other | Admitting: "Endocrinology

## 2018-01-22 ENCOUNTER — Encounter: Payer: Self-pay | Admitting: "Endocrinology

## 2018-01-22 VITALS — BP 162/94 | HR 88 | Ht 69.0 in | Wt >= 6400 oz

## 2018-01-22 DIAGNOSIS — E291 Testicular hypofunction: Secondary | ICD-10-CM | POA: Diagnosis not present

## 2018-01-22 DIAGNOSIS — R7303 Prediabetes: Secondary | ICD-10-CM

## 2018-01-22 DIAGNOSIS — I10 Essential (primary) hypertension: Secondary | ICD-10-CM

## 2018-01-22 MED ORDER — CHLORTHALIDONE 25 MG PO TABS: 25.0000 mg | ORAL_TABLET | Freq: Every day | ORAL | 3 refills | Status: DC

## 2018-01-22 NOTE — Patient Instructions (Signed)

## 2018-01-22 NOTE — Progress Notes (Signed)
Endocrinology follow-up note   Subjective:    Patient ID: Allen Harding, male    DOB: [birth year], PCP Rosita Fire, MD   Past Medical History:  Diagnosis Date  . ADHD (attention deficit hyperactivity disorder)   . Anxiety   . Autism disorder   . 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 Diabetes mellitus   . Hypertension   . Prediabetes    Past Surgical History:  Procedure Laterality Date  . CIRCUMCISION    . CIRCUMCISION REVISION    . FRENULECTOMY, LINGUAL    . lipoma    . ORCHIOPEXY     Social History   Socioeconomic History  . Marital status: Single    Spouse name: Not on file  . Number of children: Not on file  . Years of education: Not on file  . Highest education level: Not on file  Occupational History  . Not on file  Social Needs  . Financial resource strain: Not on file  . Food insecurity:    Worry: Not on file    Inability: Not on file  . Transportation needs:    Medical: Not on file    Non-medical: Not on file  Tobacco Use  . Smoking status: Former Smoker    Packs/day: 0.25    Last attempt to quit: 01/09/2014    Years since quitting: 4.0  . Smokeless tobacco: Never Used  Substance and Sexual Activity  . Alcohol use: No  . Drug use: No  . Sexual activity: Not on file  Lifestyle  . Physical activity:    Days per week: Not on file    Minutes per session: Not on file  . Stress: Not on file  Relationships  . Social connections:    Talks on phone: Not on file    Gets together: Not on file    Attends religious service: Not on file    Active member of club or organization: Not on file    Attends meetings of clubs or organizations: Not on file    Relationship status: Not on file  Other Topics Concern  . Not on file  Social History Narrative   Lives with mom, sister, 2 nieces, grandparents and sister's fiance.    Outpatient Encounter Medications as of 01/22/2018  Medication Sig  . atomoxetine (STRATTERA) 40 MG capsule  Take 80 mg by mouth daily.  . cetirizine (ZYRTEC) 10 MG tablet TAKE ONE TABLET BY MOUTH DAILY.  . chlorthalidone (HYGROTON) 25 MG tablet Take 1 tablet (25 mg total) by mouth daily.  . Cholecalciferol (VITAMIN D3) 5000 units CAPS Take 1 capsule (5,000 Units total) by mouth daily.  . enalapril (VASOTEC) 20 MG tablet Take 1 tablet (20 mg total) by mouth daily.  Marland Kitchen escitalopram (LEXAPRO) 10 MG tablet Take 10 mg by mouth daily.  . ferrous sulfate 325 (65 FE) MG tablet Take 1 tablet (325 mg total) by mouth daily with breakfast. (Patient not taking: Reported on 01/22/2018)  . guanFACINE (INTUNIV) 2 MG TB24 Take 2 mg by mouth daily.   Marland Kitchen letrozole (FEMARA) 2.5 MG tablet Take 1 tablet (2.5 mg total) by mouth daily.  . metFORMIN (GLUCOPHAGE) 500 MG tablet Take 1 tablet (500 mg total) by mouth 2 (two) times daily with a meal.  . Metoprolol Tartrate 75 MG TABS Take 75 mg by mouth 2 (two) times daily.  Marland Kitchen omeprazole (PRILOSEC) 40 MG capsule Take 1 capsule (40 mg total) by mouth 2 (two) times daily.  Marland Kitchen  oxybutynin (DITROPAN-XL) 10 MG 24 hr tablet TAKE 1 TABLET BY MOUTH DAILY.  Marland Kitchen sertraline (ZOLOFT) 25 MG tablet Take 100 mg by mouth daily. Reported on 06/16/2015  . SYRINGE-NEEDLE, DISP, 3 ML (BD ECLIPSE SYRINGE) 21G X 1" 3 ML MISC by Does not apply route.  Marland Kitchen testosterone cypionate (DEPOTESTOSTERONE CYPIONATE) 200 MG/ML injection Inject 1 mL (200 mg total) into the muscle every 14 (fourteen) days.  . traZODone (DESYREL) 100 MG tablet Take 100 mg by mouth at bedtime. Reported on 06/16/2015  . [DISCONTINUED] chlorthalidone (HYGROTON) 25 MG tablet Take 50 mg by mouth.  . [DISCONTINUED] oxybutynin (DITROPAN) 5 MG tablet Take 10 mg by mouth Nightly.    No facility-administered encounter medications on file as of 01/22/2018.    ALLERGIES: No Known Allergies VACCINATION STATUS: Immunization History  Administered Date(s) Administered  . DTaP 06/11/1995, 08/06/1995, 09/24/1995, 06/30/1996, 09/24/1999  . H1N1 11/24/2008   . HPV Quadrivalent 02/08/2014  . Hepatitis A, Ped/Adol-2 Dose 02/03/2013, 08/19/2013  . Hepatitis B 05/31/95, 05/07/1995, 09/24/1995  . HiB (PRP-OMP) 06/11/1995, 08/06/1995, 09/24/1995, 06/30/1996  . IPV 06/11/1995, 08/06/1995, 09/24/1995, 09/24/1999  . Influenza Whole 03/22/2009, 06/17/2011, 07/23/2012  . Influenza,inj,Quad PF,6+ Mos 05/02/2015  . MMR 03/31/1996, 09/24/1999  . Meningococcal Conjugate 11/24/2008, 08/19/2013  . Td 01/07/2006  . Tdap 01/07/2006  . Varicella 03/31/1996, 06/01/1997    HPI 22 year old male patient with medical history as above. 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 - He has been following at [facility] until March 2017 with Dr. Baldo Ash. -He does not know for sure when he started testosterone therapy currently at 200 mg IM every 2 weeks. -Lately, he is more consistent in his testosterone sections.  -He has dealt with heavy weight almost all of his life,  no success on weight loss.  Admits to dietary indiscretion including consumption of large quantities of soda. - Over the years he was diagnosed with prediabetes for which he was initiated on metformin , currently on 500 mg p.o. twice daily.    His most recent A1c is stable at 6%.  He has a polypharmacy, see below. -He has no acute complaints today. -He underwent orchidopexy for right-sided undescended testes. -He denies testicular injury, radiation, infection, STD. -He denies any history of head injury.   Review of Systems Constitutional: + heavy weight, + fatigue, no subjective hyperthermia/hypothermia Eyes: no blurry vision, no xerophthalmia ENT: no sore throat, no nodules palpated in throat, no dysphagia/odynophagia, no hoarseness Cardiovascular: no chest pain, no palpitations.   Respiratory: no cough/SOB Gastrointestinal: no nausea, vomiting, diarrhea.  Musculoskeletal: no muscle/joint  aches Skin: no rashes Neurological: no tremors/numbness/tingling/dizziness Psychiatric: + depression/anxiety  Objective:    BP (!) 162/94   Pulse 88   Ht '5\' 9"'  (1.753 m)   Wt (!) 405 lb (183.7 kg)   BMI 59.81 kg/m   Wt Readings from Last 3 Encounters:  01/22/18 (!) 405 lb (183.7 kg)  09/29/17 (!) 396 lb 4.8 oz (179.8 kg)  09/18/17 (!) 396 lb (179.6 kg)    Physical Exam Constitutional: Morbidly obese, alert and oriented x3, not in acute distress, poor personal hygiene. Eyes: PERRLA, EOMI, no exophthalmos ENT: moist mucous membranes, no thyromegaly, no cervical lymphadenopathy Genital exam : Deferred, due to patient preference. Musculoskeletal: no deformities, strength intact in all 4 Skin: moist, warm, no rashes, he has scant mustache and hair on his chin. Neurological: no tremor with outstretched hands, DTR normal in all 4  CMP  Component Value Date/Time   NA 138 01/15/2018 1350   K 4.0 01/15/2018 1350   CL 106 01/15/2018 1350   CO2 24 01/15/2018 1350   GLUCOSE 164 (H) 01/15/2018 1350   BUN 10 01/15/2018 1350   CREATININE 0.64 01/15/2018 1350   CALCIUM 8.9 01/15/2018 1350   PROT 6.5 01/15/2018 1350   ALBUMIN 3.5 09/22/2017 1101   AST 18 01/15/2018 1350   ALT 23 01/15/2018 1350   ALKPHOS 90 09/22/2017 1101   BILITOT 0.3 01/15/2018 1350   GFRNONAA 139 01/15/2018 1350   GFRAA 161 01/15/2018 1350   Diabetic Labs (most recent): Lab Results  Component Value Date   HGBA1C 6.0 (H) 01/15/2018   HGBA1C 6.0 (H) 05/14/2017   HGBA1C 6.1 (H) 09/18/2016    Lipid Panel     Component Value Date/Time   CHOL 222 (H) 08/08/2015 1152   TRIG 105 08/08/2015 1152   HDL 46 08/08/2015 1152   CHOLHDL 4.8 08/08/2015 1152   VLDL 21 08/08/2015 1152   LDLCALC 155 (H) 08/08/2015 1152    Recent Results (from the past 2160 hour(s))  Testosterone Total,Free,Bio, Males     Status: Abnormal   Collection Time: 01/15/18  1:50 PM  Result Value Ref Range   Testosterone 234 (L) 250 -  827 ng/dL   Albumin 3.5 (L) 3.6 - 5.1 g/dL   Sex Hormone Binding 13 10 - 50 nmol/L   Testosterone, Free See below 46.0 - 224.0 pg/mL   Testosterone, Bioavailable  110.0 - 575 ng/dL    Comment: Due to the diminished accuracy of immunoassay at levels below 250 ng/dL, calculations of the Free and Bioavailable Testosterone are not accurate. If needed, Testosterone, Free, Bio and Total, LC/MS/MS (test code [id number]) is the recommended assay. This specimen  must be collected in a red-top tube with no gel. .   COMPLETE METABOLIC PANEL WITH GFR     Status: Abnormal   Collection Time: 01/15/18  1:50 PM  Result Value Ref Range   Glucose, Bld 164 (H) 65 - 139 mg/dL    Comment: .        Non-fasting reference interval .    BUN 10 7 - 25 mg/dL   Creat 0.64 0.60 - 1.35 mg/dL   GFR, Est Non African American 139 > OR = 60 mL/min/1.23m   GFR, Est African American 161 > OR = 60 mL/min/1.774m  BUN/Creatinine Ratio NOT APPLICABLE 6 - 22 (calc)   Sodium 138 135 - 146 mmol/L   Potassium 4.0 3.5 - 5.3 mmol/L   Chloride 106 98 - 110 mmol/L   CO2 24 20 - 32 mmol/L   Calcium 8.9 8.6 - 10.3 mg/dL   Total Protein 6.5 6.1 - 8.1 g/dL   Albumin 3.5 (L) 3.6 - 5.1 g/dL   Globulin 3.0 1.9 - 3.7 g/dL (calc)   AG Ratio 1.2 1.0 - 2.5 (calc)   Total Bilirubin 0.3 0.2 - 1.2 mg/dL   Alkaline phosphatase (APISO) 86 40 - 115 U/L   AST 18 10 - 40 U/L   ALT 23 9 - 46 U/L  Hemoglobin A1c     Status: Abnormal   Collection Time: 01/15/18  1:50 PM  Result Value Ref Range   Hgb A1c MFr Bld 6.0 (H) <5.7 % of total Hgb    Comment: For someone without known diabetes, a hemoglobin  A1c value between 5.7% and 6.4% is consistent with prediabetes and should be confirmed with a  follow-up test. . For someone  with known diabetes, a value <7% indicates that their diabetes is well controlled. A1c targets should be individualized based on duration of diabetes, age, comorbid conditions, and other considerations. . This assay  result is consistent with an increased risk of diabetes. . Currently, no consensus exists regarding use of hemoglobin A1c for diagnosis of diabetes for children. .    Mean Plasma Glucose 126 (calc)   eAG (mmol/L) 7.0 (calc)     Assessment & Plan:   1. Pre-diabetes - I have reviewed his history and evaluated patient clinically. His prediabetes is directly linked to his obesity, see #3 below. His A1c remains stable at 6%.  I advised him to continue metformin 500 mg p.o. twice daily-daily after breakfast and supper.     2. Hypogonadism  - Etiology most likely multifactorial including chromosomal abnormality and history of undescended testes.  - He has required testosterone supplement for several years now. I have reviewed his EMR records and found out that he had low testosterone starting from at least 2012.  -I have not seen FSH/LH levels, would have  been useful prior to initiation of testosterone therapy however the utility of that test now is unremarkable. 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 - His previsit testosterone level is 234 on the day of injection, was 327 last visit.   This is an acceptable level, I advised him to continue testosterone 200 mg IM every 2 weeks.  Therapeutic goal is to keep his total testosterone above 250.  If he cannot achieve that, would be tried for testosterone 100 mg IM every week.   -I will obtain total testosterone before his next visit.   3. Morbid obesity due to excess calories ([place]) - See #1 above.  This is his most pressing health problem, but patient is not concerned, did not make any significant behavioral change as far as carbs consumptions. - The most likely cause of his morbid obesity is still excess caloric intake.   Cushing's syndrome, hypothyroidism are ruled out as a cause of weight gain.  -  Suggestion is made for him to avoid simple carbohydrates  from his diet including Cakes, Sweet Desserts / Pastries, Ice Cream, Soda (diet  and regular), Sweet Tea, Candies, Chips, Cookies, Store Bought Juices, Alcohol in Excess of  1-2 drinks a day, Artificial Sweeteners, and "Sugar-free" Products. This will help patient to have stable blood glucose profile and potentially avoid unintended weight gain.   - Consult with dietitian/diabetes educator Jearld Fenton, CDE in progress. - His mother gathered information on bariatric surgery, however his insurance would not cover.   4. Essential hypertension -His blood pressure is not controlled to target.  As of last visit he had 3 medications for blood pressure including chlorthalidone, enalapril, and metoprolol.  He is not sure about chlorthalidone treatment lately.  I discussed and reinitiated the prescription for chlorthalidone 25 mg p.o. daily, along with enalapril 20 mg p.o. daily and metoprolol 75 mg p.o. twice daily.   I advised him on salt restrictions and to be consistent taking his medications and follow-up with Dr. Legrand Rams for primary care needs.  5. Undescended right testicle - He is status post orchidopexy of right testicle. The details of his surgical history are not available for review.   - Time spent with the patient: 25 min, of which >50% was spent in reviewing his  current and  previous labs, previous treatments, and medications doses and developing a plan for long-term care.  Allen Harding participated in  the discussions, expressed understanding, and voiced agreement with the above plans.  All questions were answered to his satisfaction. he is encouraged to contact clinic should he have any questions or concerns prior to his return visit.  Follow up plan: Return in about 4 months (around 05/24/2018) for Follow up with Pre-visit Labs.  Glade Lloyd, MD Phone: [phone]  Fax: [phone]   This note was partially dictated with voice recognition software. Similar sounding words can be transcribed inadequately or may not  be corrected upon review.  01/22/2018, 11:55  AM

## 2018-03-24 ENCOUNTER — Inpatient Hospital Stay (HOSPITAL_COMMUNITY): Payer: Medicaid Other | Attending: Hematology

## 2018-03-24 DIAGNOSIS — R21 Rash and other nonspecific skin eruption: Secondary | ICD-10-CM | POA: Insufficient documentation

## 2018-03-24 DIAGNOSIS — I10 Essential (primary) hypertension: Secondary | ICD-10-CM | POA: Diagnosis not present

## 2018-03-24 DIAGNOSIS — E669 Obesity, unspecified: Secondary | ICD-10-CM | POA: Diagnosis not present

## 2018-03-24 DIAGNOSIS — D72829 Elevated white blood cell count, unspecified: Secondary | ICD-10-CM | POA: Insufficient documentation

## 2018-03-24 DIAGNOSIS — Z87891 Personal history of nicotine dependence: Secondary | ICD-10-CM | POA: Insufficient documentation

## 2018-03-24 DIAGNOSIS — K760 Fatty (change of) liver, not elsewhere classified: Secondary | ICD-10-CM | POA: Diagnosis not present

## 2018-03-24 LAB — CBC WITH DIFFERENTIAL/PLATELET
Abs Immature Granulocytes: 0.06 10*3/uL (ref 0.00–0.07)
BASOS ABS: 0 10*3/uL (ref 0.0–0.1)
BASOS PCT: 0 %
EOS ABS: 0.3 10*3/uL (ref 0.0–0.5)
EOS PCT: 3 %
HCT: 44.1 % (ref 39.0–52.0)
Hemoglobin: 13.9 g/dL (ref 13.0–17.0)
IMMATURE GRANULOCYTES: 1 %
LYMPHS ABS: 3.2 10*3/uL (ref 0.7–4.0)
LYMPHS PCT: 28 %
MCH: 26.6 pg (ref 26.0–34.0)
MCHC: 31.5 g/dL (ref 30.0–36.0)
MCV: 84.5 fL (ref 80.0–100.0)
Monocytes Absolute: 0.5 10*3/uL (ref 0.1–1.0)
Monocytes Relative: 4 %
NRBC: 0 % (ref 0.0–0.2)
Neutro Abs: 7.5 10*3/uL (ref 1.7–7.7)
Neutrophils Relative %: 64 %
PLATELETS: 407 10*3/uL — AB (ref 150–400)
RBC: 5.22 MIL/uL (ref 4.22–5.81)
RDW: 13.9 % (ref 11.5–15.5)
WBC: 11.6 10*3/uL — ABNORMAL HIGH (ref 4.0–10.5)

## 2018-03-24 LAB — COMPREHENSIVE METABOLIC PANEL
ALT: 28 U/L (ref 0–44)
ANION GAP: 9 (ref 5–15)
AST: 24 U/L (ref 15–41)
Albumin: 3.4 g/dL — ABNORMAL LOW (ref 3.5–5.0)
Alkaline Phosphatase: 86 U/L (ref 38–126)
BILIRUBIN TOTAL: 0.6 mg/dL (ref 0.3–1.2)
BUN: 9 mg/dL (ref 6–20)
CHLORIDE: 105 mmol/L (ref 98–111)
CO2: 23 mmol/L (ref 22–32)
Calcium: 8.8 mg/dL — ABNORMAL LOW (ref 8.9–10.3)
Creatinine, Ser: 0.73 mg/dL (ref 0.61–1.24)
Glucose, Bld: 137 mg/dL — ABNORMAL HIGH (ref 70–99)
POTASSIUM: 3.4 mmol/L — AB (ref 3.5–5.1)
Sodium: 137 mmol/L (ref 135–145)
TOTAL PROTEIN: 7.6 g/dL (ref 6.5–8.1)

## 2018-03-24 LAB — SEDIMENTATION RATE: SED RATE: 42 mm/h — AB (ref 0–16)

## 2018-03-24 LAB — C-REACTIVE PROTEIN: CRP: 6.2 mg/dL — ABNORMAL HIGH (ref <1.0)

## 2018-03-24 LAB — LACTATE DEHYDROGENASE: LDH: 162 U/L (ref 98–192)

## 2018-03-27 LAB — JAK2 GENOTYPR

## 2018-03-31 ENCOUNTER — Encounter (HOSPITAL_COMMUNITY): Payer: Self-pay | Admitting: Internal Medicine

## 2018-03-31 ENCOUNTER — Inpatient Hospital Stay (HOSPITAL_BASED_OUTPATIENT_CLINIC_OR_DEPARTMENT_OTHER): Payer: Medicaid Other | Admitting: Internal Medicine

## 2018-03-31 ENCOUNTER — Other Ambulatory Visit: Payer: Self-pay

## 2018-03-31 VITALS — BP 169/124 | HR 106 | Temp 99.1°F | Resp 20 | Wt >= 6400 oz

## 2018-03-31 DIAGNOSIS — R21 Rash and other nonspecific skin eruption: Secondary | ICD-10-CM

## 2018-03-31 DIAGNOSIS — E669 Obesity, unspecified: Secondary | ICD-10-CM

## 2018-03-31 DIAGNOSIS — Z87891 Personal history of nicotine dependence: Secondary | ICD-10-CM

## 2018-03-31 DIAGNOSIS — I10 Essential (primary) hypertension: Secondary | ICD-10-CM | POA: Diagnosis not present

## 2018-03-31 DIAGNOSIS — K760 Fatty (change of) liver, not elsewhere classified: Secondary | ICD-10-CM | POA: Diagnosis not present

## 2018-03-31 DIAGNOSIS — D72829 Elevated white blood cell count, unspecified: Secondary | ICD-10-CM

## 2018-03-31 NOTE — Progress Notes (Signed)
Diagnosis Leukocytosis, unspecified type - Plan: CBC with Differential/Platelet, Comprehensive metabolic panel, Lactate dehydrogenase, Sedimentation rate, C-reactive protein  Staging Cancer Staging No matching staging information was found for the patient.  Assessment and Plan:  1. Chronic leukocytosis with neutrophilic predominance.  Patient was previously followed by Dr. Talbert Cage.  He underwent Abdominal US on 03/24/2017 that was negative except for hepatic steatosis. He has been ruled out for CML. He was NEGATIVE for the BCR-ABL1 e1a2 (p190), e13a2 (b2a2, p210) and e14a2, (b3a2, p210) fusion transcripts.  Patient is not on steroids and no cause for neutrophilia has been found on his medication list.  Labs done 03/24/2018 reviewed and showed WBC improved at 11.6 Hb 13.9 plts 407,000.  Chemistries WNL with K+ 3.4 cr 0.73 and normal LFTs.  CRP elevated at 6.2 with elevated sed rate of 42.  Jak 2 testing was negative.    I discussed with him I suspect a reactive process as WBC is improved today compared to labs done 01/2018 .  Pt denies smoking history. His workup has been negative and he will RTC in 1 year for follow-up and repeat labs.     If white count increases and approaches 20,000 bone marrow biopsy will be recommended for definitive diagnosis.  2.  Hypertension.  Blood pressure is again noted to be elevated at 169/124.  I discussed with him BP was elevated at 187/120 his last visit in 09/2017.   Pt reports he had not taken BP medications.  He is advised to follow-up with his PCP for evaluation.  3.  Fatty liver.  This was seen on ultrasound done 03/24/2017.  Liver function tests on labs done 03/24/2018 were WNL.  Likely related to obesity.  4.  Obesity.  Follow-up with PCP for recommendations.  5  Rash.  Pt advised to follow-up with PCP for evaluation.    25 minutes spent with more than 50% spent in counseling and coordination of care.    Current Status: Pt seen today for follow-up.  He  denies smoking or steroid use.  He is here to go over labs.   Problem List Patient Active Problem List   Diagnosis Date Noted  . Leukocytosis [D72.829] 04/01/2017  . Undescended right testicle [Q53.10] 12/21/2015  . Tachycardia [R00.0] 11/16/2015  . ADHD (attention deficit hyperactivity disorder) [F90.9]   . Allergic rhinitis [J30.9] 12/14/2014  . Iron deficiency anemia [D50.9] 09/29/2014  . Medication monitoring encounter [Z51.81] 09/06/2014  . Enuresis [R32] 08/20/2013  . Well child check [Z00.129] 08/20/2013  . Absence of bladder continence [R32] 08/20/2013  . Gynecomastia [N62] 07/21/2012  . Hypogonadism male [E29.1] 03/03/2012  . 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 Dysthymia [F34.1] 10/29/2011  . 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 Pre-diabetes [R73.03] 10/25/2010  . Morbid obesity due to excess calories ([place]) [E66.01] 10/25/2010  . Essential hypertension, benign [I10] 10/25/2010  . Goiter, unspecified [E04.9] 10/25/2010  . Autism [F84.0] 10/25/2010  . Autism spectrum [F84.0] 10/25/2010    Past Medical History Past Medical History:  Diagnosis Date  . ADHD (attention deficit hyperactivity disorder)   . Anxiety   . Autism disorder   . 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 Diabetes mellitus   . Hypertension   . Prediabetes     Past Surgical History Past Surgical History:  Procedure Laterality Date  . CIRCUMCISION    . CIRCUMCISION REVISION    . FRENULECTOMY, LINGUAL    . lipoma    . ORCHIOPEXY  Family History Family History  Problem Relation Age of Onset  . Obesity Mother   . Diabetes Mother   . Hypertension Mother   . Hyperlipidemia Mother   . Obesity Sister   . Obesity Maternal Grandmother   . Diabetes Maternal Grandmother   . Cancer Maternal Grandmother   . Stroke Maternal Grandmother   . Obesity Maternal Grandfather      Social History  reports that he quit smoking about 4 years ago. He smoked 0.25  packs per day. He has never used smokeless tobacco. He reports that he does not drink alcohol or use drugs.  Medications  Current Outpatient Medications:  .  SYRINGE-NEEDLE, DISP, 3 ML (BD ECLIPSE SYRINGE) 21G X 1" 3 ML MISC, by Does not apply route., Disp: , Rfl:  .  atomoxetine (STRATTERA) 40 MG capsule, Take 80 mg by mouth daily., Disp: , Rfl:  .  cetirizine (ZYRTEC) 10 MG tablet, TAKE ONE TABLET BY MOUTH DAILY., Disp: 30 tablet, Rfl: 0 .  chlorthalidone (HYGROTON) 25 MG tablet, Take 1 tablet (25 mg total) by mouth daily., Disp: 30 tablet, Rfl: 3 .  enalapril (VASOTEC) 20 MG tablet, Take 1 tablet (20 mg total) by mouth daily., Disp: 30 tablet, Rfl: 5 .  escitalopram (LEXAPRO) 10 MG tablet, Take 10 mg by mouth daily., Disp: , Rfl:  .  guanFACINE (INTUNIV) 2 MG TB24, Take 2 mg by mouth daily. , Disp: , Rfl:  .  letrozole (FEMARA) 2.5 MG tablet, Take 1 tablet (2.5 mg total) by mouth daily., Disp: 30 tablet, Rfl: 3 .  metFORMIN (GLUCOPHAGE) 500 MG tablet, Take 1 tablet (500 mg total) by mouth 2 (two) times daily with a meal., Disp: 60 tablet, Rfl: 2 .  Metoprolol Tartrate 75 MG TABS, Take 75 mg by mouth 2 (two) times daily., Disp: 60 tablet, Rfl: 3 .  omeprazole (PRILOSEC) 40 MG capsule, Take 1 capsule (40 mg total) by mouth 2 (two) times daily., Disp: 60 capsule, Rfl: 6 .  oxybutynin (DITROPAN-XL) 10 MG 24 hr tablet, TAKE 1 TABLET BY MOUTH DAILY., Disp: 30 tablet, Rfl: 3 .  sertraline (ZOLOFT) 25 MG tablet, Take 100 mg by mouth daily. Reported on 06/16/2015, Disp: , Rfl:  .  testosterone cypionate (DEPOTESTOSTERONE CYPIONATE) 200 MG/ML injection, Inject 1 mL (200 mg total) into the muscle every 14 (fourteen) days., Disp: 10 mL, Rfl: 0 .  traZODone (DESYREL) 100 MG tablet, Take 100 mg by mouth at bedtime. Reported on 06/16/2015, Disp: , Rfl:   Allergies Patient has no known allergies.  Review of Systems Review of Systems - Oncology ROS negative other than rash on buttock area.     Physical  Exam  Vitals Wt Readings from Last 3 Encounters:  03/31/18 (!) 400 lb 9.6 oz (181.7 kg)  01/22/18 (!) 405 lb (183.7 kg)  09/29/17 (!) 396 lb 4.8 oz (179.8 kg)   Temp Readings from Last 3 Encounters:  03/31/18 99.1 F (37.3 C) (Oral)  04/01/17 98.3 F (36.8 C) (Oral)  11/21/16 98.4 F (36.9 C) (Oral)   BP Readings from Last 3 Encounters:  03/31/18 (!) 169/124  01/22/18 (!) 162/94  09/29/17 (!) 187/120   Pulse Readings from Last 3 Encounters:  03/31/18 (!) 106  01/22/18 88  09/29/17 (!) 110   Constitutional: Obese male in no distress.   HENT: Head: Normocephalic and atraumatic.  Mouth/Throat: No oropharyngeal exudate. Mucosa moist. Eyes: Pupils are equal, round, and reactive to light. Conjunctivae are normal. No scleral icterus.  Neck:  Normal range of motion. Neck supple. No JVD present.  Cardiovascular: Normal rate, regular rhythm and normal heart sounds.  Exam reveals no gallop and no friction rub.   No murmur heard. Pulmonary/Chest: Effort normal and breath sounds normal. No respiratory distress. No wheezes.No rales.  Abdominal: Soft. Bowel sounds are normal. No distension. There is no tenderness. There is no guarding.  Musculoskeletal: No edema or tenderness.  Lymphadenopathy: No cervical, axillary or supraclavicular adenopathy.  Neurological: Alert and oriented to person, place, and time. No cranial nerve deficit.  Skin: Skin is warm and dry. Pt reports rash on buttock. Psychiatric: Affect and judgment normal.   Labs No visits with results within 3 Day(s) from this visit.  Latest known visit with results is:  Appointment on 03/24/2018  Component Date Value Ref Range Status  . WBC 03/24/2018 11.6* 4.0 - 10.5 K/uL Final  . RBC 03/24/2018 5.22  4.22 - 5.81 MIL/uL Final  . Hemoglobin 03/24/2018 13.9  13.0 - 17.0 g/dL Final  . HCT 03/24/2018 44.1  39.0 - 52.0 % Final  . MCV 03/24/2018 84.5  80.0 - 100.0 fL Final  . MCH 03/24/2018 26.6  26.0 - 34.0 pg Final  . MCHC  03/24/2018 31.5  30.0 - 36.0 g/dL Final  . RDW 03/24/2018 13.9  11.5 - 15.5 % Final  . Platelets 03/24/2018 407* 150 - 400 K/uL Final  . nRBC 03/24/2018 0.0  0.0 - 0.2 % Final  . Neutrophils Relative % 03/24/2018 64  % Final  . Neutro Abs 03/24/2018 7.5  1.7 - 7.7 K/uL Final  . Lymphocytes Relative 03/24/2018 28  % Final  . Lymphs Abs 03/24/2018 3.2  0.7 - 4.0 K/uL Final  . Monocytes Relative 03/24/2018 4  % Final  . Monocytes Absolute 03/24/2018 0.5  0.1 - 1.0 K/uL Final  . Eosinophils Relative 03/24/2018 3  % Final  . Eosinophils Absolute 03/24/2018 0.3  0.0 - 0.5 K/uL Final  . Basophils Relative 03/24/2018 0  % Final  . Basophils Absolute 03/24/2018 0.0  0.0 - 0.1 K/uL Final  . Immature Granulocytes 03/24/2018 1  % Final  . Abs Immature Granulocytes 03/24/2018 0.06  0.00 - 0.07 K/uL Final   Performed at [facility], [address] [place], [place] [address]  . Sodium 03/24/2018 137  135 - 145 mmol/L Final  . Potassium 03/24/2018 3.4* 3.5 - 5.1 mmol/L Final  . Chloride 03/24/2018 105  98 - 111 mmol/L Final  . CO2 03/24/2018 23  22 - 32 mmol/L Final  . Glucose, Bld 03/24/2018 137* 70 - 99 mg/dL Final  . BUN 03/24/2018 9  6 - 20 mg/dL Final  . Creatinine, Ser 03/24/2018 0.73  0.61 - 1.24 mg/dL Final  . Calcium 03/24/2018 8.8* 8.9 - 10.3 mg/dL Final  . Total Protein 03/24/2018 7.6  6.5 - 8.1 g/dL Final  . Albumin 03/24/2018 3.4* 3.5 - 5.0 g/dL Final  . AST 03/24/2018 24  15 - 41 U/L Final  . ALT 03/24/2018 28  0 - 44 U/L Final  . Alkaline Phosphatase 03/24/2018 86  38 - 126 U/L Final  . Total Bilirubin 03/24/2018 0.6  0.3 - 1.2 mg/dL Final  . GFR calc non Af Amer 03/24/2018 >60  >60 mL/min Final  . GFR calc Af Amer 03/24/2018 >60  >60 mL/min Final   Comment: (NOTE) The eGFR has been calculated using the CKD EPI equation. This calculation has not been validated in all clinical situations. eGFR's persistently <60 mL/min signify possible Chronic  Kidney Disease.   Georgiann Hahn gap  03/24/2018 9  5 - 15 Final   Performed at [facility], [address], [place], [place] [address]  . LDH 03/24/2018 162  98 - 192 U/L Final   Performed at [facility], [address] [place], [place] [address]  . Sed Rate 03/24/2018 42* 0 - 16 mm/hr Final   Performed at [facility], [address] [place], [place] [address]  . CRP 03/24/2018 6.2* <1.0 mg/dL Final   Performed at [facility] [address], [place], [place] [address]  . JAK2 GenotypR 03/24/2018 Comment   Final   Comment: (NOTE) Result: NEGATIVE for the JAK2 V617F mutation. Interpretation:  The G to T nucleotide change encoding the V617F mutation was not detected.  This result does not rule out the presence of the JAK2 mutation at a level below the sensitivity of detection of this assay, or the presence of other mutations within JAK2 not detected by this assay.  This result does not rule out a diagnosis of polycythemia vera, essential thrombocythemia or idiopathic myelofibrosis as the V617F mutation is not detected in all patients with these disorders.   . Director Review, JAK2 03/24/2018 Comment   Corrected   Comment: (NOTE) Constance Goltz, PhD, [facility]               Director, [place] [facility] [place], [place]               [phone] This test was developed and its performance characteristics determined by [facility]. It has not been cleared or approved by the Food and Drug Administration. Performed At: [facility] [address] [place], [place] [id number] Nechama Guard MD KM:[id number] Performed At: [facility] [place], [place] [id number] Nechama Guard MD VA:[id number]   . BACKGROUND: 03/24/2018 Comment   Corrected   Comment: (NOTE) JAK2 is a cytoplasmic tyrosine kinase with a key role in signal transduction from multiple hematopoietic growth factor receptors.  A point mutation within exon 14 of the JAK2 gene ([id number]) encoding a valine to phenylalanine substitution at position 617 of the JAK2 protein (V617F) has been identified in most patients with polycythemia vera, and in about half of those with either essential thrombocythemia or idiopathic myelofibrosis. The V617F has also been detected, although infrequently, in other myeloid disorders such as chronic myelomonocytic leukemia and chronic neutrophilic luekemia. V617F is an acquired mutation that alters a highly conserved valine present in the negative regulatory JH2 domain of the JAK2 protein and is predicted to dysregulate kinase activity. Methodology: Total genomic DNA was extracted and subjected to TaqMan real-time PCR amplification/detection. Two amplification products per sample were monitored by real-time PCR using primers/probes s                          pecific to JAK2 wild type (WT) and JAK2 mutant V617F. The ABI7900 Absolute Quantitation software will compare the patient specimen valuse to the standard curves and generate percent values for wild type and mutant type. In vitro studies have indicated that this assay has an analytical sensitivity of 1%. References: Baxter EJ, Augustin Coupe,  et al. Acquired mutation of the tyrosine kinase JAK2 in human myeloproliferative disorders. Lancet. 2005 Mar 19-25; 365(9464):1054-1061. Alfonso Ramus Couedic JP. A unique clonal JAK2 mutation leading to constitutive signaling causes polycythaemia vera. Nature. 2005 Apr 28; 434(7037):1144-1148. Kralovics R, Passamonti F, Buser AS, et al. A gain-of-function mutation of JAK2 in myeloproliferative disorders. N Engl J Med. 2005 Apr 28; 352(17):1779-1790.      Pathology Orders Placed This Encounter  Procedures  . CBC with Differential/Platelet    Standing Status:   Future    Standing Expiration Date:   03/31/2020  . Comprehensive metabolic panel    Standing Status:   Future     Standing Expiration Date:   03/31/2020  . Lactate dehydrogenase    Standing Status:   Future    Standing Expiration Date:   03/31/2020  . Sedimentation rate    Standing Status:   Future    Standing Expiration Date:   03/31/2020  . C-reactive protein    Standing Status:   Future    Standing Expiration Date:   03/31/2020       Zoila Shutter MD

## 2018-05-21 LAB — TESTOS,TOTAL,FREE AND SHBG (FEMALE)
FREE TESTOSTERONE: 23.4 pg/mL — AB (ref 35.0–155.0)
SEX HORMONE BINDING: 16 nmol/L (ref 10–50)
Testosterone, Total, LC-MS-MS: 105 ng/dL — ABNORMAL LOW (ref 250–1100)

## 2018-05-21 LAB — PSA: PSA: 0.3 ng/mL (ref <=4.0)

## 2018-05-25 ENCOUNTER — Ambulatory Visit (INDEPENDENT_AMBULATORY_CARE_PROVIDER_SITE_OTHER): Payer: Medicaid Other | Admitting: "Endocrinology

## 2018-05-25 ENCOUNTER — Encounter: Payer: Self-pay | Admitting: "Endocrinology

## 2018-05-25 VITALS — BP 173/127 | HR 94 | Resp 12 | Ht 70.5 in | Wt >= 6400 oz

## 2018-05-25 DIAGNOSIS — E291 Testicular hypofunction: Secondary | ICD-10-CM | POA: Diagnosis not present

## 2018-05-25 DIAGNOSIS — R7303 Prediabetes: Secondary | ICD-10-CM | POA: Diagnosis not present

## 2018-05-25 DIAGNOSIS — I10 Essential (primary) hypertension: Secondary | ICD-10-CM

## 2018-05-25 MED ORDER — CLONIDINE HCL 0.1 MG PO TABS: 0.1000 mg | ORAL_TABLET | Freq: Two times a day (BID) | ORAL | 11 refills | Status: DC

## 2018-05-25 NOTE — Patient Instructions (Signed)

## 2018-05-25 NOTE — Progress Notes (Signed)
Endocrinology follow-up note   Subjective:    Patient ID: Allen Harding, male    DOB: [birth year], PCP Rosita Fire, MD   Past Medical History:  Diagnosis Date  . ADHD (attention deficit hyperactivity disorder)   . Anxiety   . Autism disorder   . 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 Diabetes mellitus   . Hypertension   . Prediabetes    Past Surgical History:  Procedure Laterality Date  . CIRCUMCISION    . CIRCUMCISION REVISION    . FRENULECTOMY, LINGUAL    . lipoma    . ORCHIOPEXY     Social History   Socioeconomic History  . Marital status: Single    Spouse name: Not on file  . Number of children: Not on file  . Years of education: Not on file  . Highest education level: Not on file  Occupational History  . Not on file  Social Needs  . Financial resource strain: Not on file  . Food insecurity:    Worry: Not on file    Inability: Not on file  . Transportation needs:    Medical: Not on file    Non-medical: Not on file  Tobacco Use  . Smoking status: Former Smoker    Packs/day: 0.25    Last attempt to quit: 01/09/2014    Years since quitting: 4.3  . Smokeless tobacco: Never Used  Substance and Sexual Activity  . Alcohol use: No  . Drug use: No  . Sexual activity: Not on file  Lifestyle  . Physical activity:    Days per week: Not on file    Minutes per session: Not on file  . Stress: Not on file  Relationships  . Social connections:    Talks on phone: Not on file    Gets together: Not on file    Attends religious service: Not on file    Active member of club or organization: Not on file    Attends meetings of clubs or organizations: Not on file    Relationship status: Not on file  Other Topics Concern  . Not on file  Social History Narrative   Lives with mom, sister, 2 nieces, grandparents and sister's fiance.    Outpatient Encounter Medications as of 05/25/2018  Medication Sig  . atomoxetine (STRATTERA) 40 MG capsule  Take 80 mg by mouth daily.  . cetirizine (ZYRTEC) 10 MG tablet TAKE ONE TABLET BY MOUTH DAILY.  . chlorthalidone (HYGROTON) 25 MG tablet Take 1 tablet (25 mg total) by mouth daily.  . enalapril (VASOTEC) 20 MG tablet Take 1 tablet (20 mg total) by mouth daily.  Marland Kitchen escitalopram (LEXAPRO) 10 MG tablet Take 10 mg by mouth daily.  Marland Kitchen guanFACINE (INTUNIV) 2 MG TB24 Take 2 mg by mouth daily.   Marland Kitchen letrozole (FEMARA) 2.5 MG tablet Take 1 tablet (2.5 mg total) by mouth daily.  . metFORMIN (GLUCOPHAGE) 500 MG tablet Take 1 tablet (500 mg total) by mouth 2 (two) times daily with a meal.  . Metoprolol Tartrate 75 MG TABS Take 75 mg by mouth 2 (two) times daily.  Marland Kitchen omeprazole (PRILOSEC) 40 MG capsule Take 1 capsule (40 mg total) by mouth 2 (two) times daily.  Marland Kitchen oxybutynin (DITROPAN-XL) 10 MG 24 hr tablet TAKE 1 TABLET BY MOUTH DAILY.  Marland Kitchen sertraline (ZOLOFT) 25 MG tablet Take 100 mg by mouth daily. Reported on 06/16/2015  . SYRINGE-NEEDLE, DISP, 3 ML (BD ECLIPSE SYRINGE) 21G X 1" 3  ML MISC by Does not apply route.  Marland Kitchen testosterone cypionate (DEPOTESTOSTERONE CYPIONATE) 200 MG/ML injection Inject 1 mL (200 mg total) into the muscle every 14 (fourteen) days.  . traZODone (DESYREL) 100 MG tablet Take 50 mg by mouth at bedtime. Reported on 06/16/2015  . cloNIDine (CATAPRES) 0.1 MG tablet Take 1 tablet (0.1 mg total) by mouth 2 (two) times daily.  . [DISCONTINUED] oxybutynin (DITROPAN) 5 MG tablet Take 10 mg by mouth Nightly.    No facility-administered encounter medications on file as of 05/25/2018.    ALLERGIES: No Known Allergies VACCINATION STATUS: Immunization History  Administered Date(s) Administered  . DTaP 06/11/1995, 08/06/1995, 09/24/1995, 06/30/1996, 09/24/1999  . H1N1 11/24/2008  . HPV Quadrivalent 02/08/2014  . Hepatitis A, Ped/Adol-2 Dose 02/03/2013, 08/19/2013  . Hepatitis B November 12, 1994, 05/07/1995, 09/24/1995  . HiB (PRP-OMP) 06/11/1995, 08/06/1995, 09/24/1995, 06/30/1996  . IPV 06/11/1995,  08/06/1995, 09/24/1995, 09/24/1999  . Influenza Whole 03/22/2009, 06/17/2011, 07/23/2012  . Influenza,inj,Quad PF,6+ Mos 05/02/2015  . MMR 03/31/1996, 09/24/1999  . Meningococcal Conjugate 11/24/2008, 08/19/2013  . Td 01/07/2006  . Tdap 01/07/2006  . Varicella 03/31/1996, 06/01/1997    HPI 22 year old male patient with medical history as above. 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 - He has been following at [facility] until March 2017 with Dr. Baldo Ash. -He does not know for sure when he started testosterone therapy currently at 200 mg IM every 2 weeks.  He missed 2 cycles of injections before his last morning testosterone level which was low at 105. -He has dealt with heavy weight almost all of his life,  no success on weight loss.  Admits to dietary indiscretion including consumption of large quantities of soda. - Over the years he was diagnosed with prediabetes for which he was initiated on metformin , currently on 500 mg p.o. twice daily.    His most recent A1c is stable at 6%.  He has a polypharmacy, see below. -He has no acute complaints today. -He underwent orchidopexy for right-sided undescended testes. -He denies testicular injury, radiation, infection, STD. -He denies any history of head injury.   Review of Systems Constitutional: + heavy weight, + fatigue, no subjective hyperthermia/hypothermia Eyes: no blurry vision, no xerophthalmia ENT: no sore throat, no nodules palpated in throat, no dysphagia/odynophagia, no hoarseness Musculoskeletal: no muscle/joint aches Skin: no rashes Neurological: no tremors/numbness/tingling/dizziness Psychiatric: + depression/anxiety  Objective:    BP (!) 173/127 (BP Location: Right Arm, Patient Position: Sitting, Cuff Size: Large)   Pulse 94   Resp 12   Ht 5' 10.5" (1.791 m)   Wt (!) 415 lb (188.2 kg) Comment: wearing steel  toe boots  SpO2 96% Comment: room air  BMI 58.70 kg/m   Wt Readings from Last 3 Encounters:  05/25/18 (!) 415 lb (188.2 kg)  03/31/18 (!) 400 lb 9.6 oz (181.7 kg)  01/22/18 (!) 405 lb (183.7 kg)    Physical Exam Constitutional: Morbidly obese, alert and oriented x3, not in acute distress, + poor personal hygiene.  Eyes: PERRLA, EOMI, no exophthalmos ENT: moist mucous membranes, no thyromegaly, no cervical lymphadenopathy Genital exam : Deferred, due to patient preference. Musculoskeletal: no deformities, strength intact in all 4 Skin: moist, warm, no rashes, he has scant mustache and hair on his chin. Neurological: no tremor with outstretched hands  CMP     Component Value Date/Time   NA 137 03/24/2018 1100   K 3.4 (L) 03/24/2018 1100   CL 105 03/24/2018  1100   CO2 23 03/24/2018 1100   GLUCOSE 137 (H) 03/24/2018 1100   BUN 9 03/24/2018 1100   CREATININE 0.73 03/24/2018 1100   CREATININE 0.64 01/15/2018 1350   CALCIUM 8.8 (L) 03/24/2018 1100   PROT 7.6 03/24/2018 1100   ALBUMIN 3.4 (L) 03/24/2018 1100   AST 24 03/24/2018 1100   ALT 28 03/24/2018 1100   ALKPHOS 86 03/24/2018 1100   BILITOT 0.6 03/24/2018 1100   GFRNONAA >60 03/24/2018 1100   GFRNONAA 139 01/15/2018 1350   GFRAA >60 03/24/2018 1100   GFRAA 161 01/15/2018 1350   Diabetic Labs (most recent): Lab Results  Component Value Date   HGBA1C 6.0 (H) 01/15/2018   HGBA1C 6.0 (H) 05/14/2017   HGBA1C 6.1 (H) 09/18/2016    Lipid Panel     Component Value Date/Time   CHOL 222 (H) 08/08/2015 1152   TRIG 105 08/08/2015 1152   HDL 46 08/08/2015 1152   CHOLHDL 4.8 08/08/2015 1152   VLDL 21 08/08/2015 1152   LDLCALC 155 (H) 08/08/2015 1152    Recent Results (from the past 2160 hour(s))  CBC with Differential/Platelet     Status: Abnormal   Collection Time: 03/24/18 11:00 AM  Result Value Ref Range   WBC 11.6 (H) 4.0 - 10.5 K/uL   RBC 5.22 4.22 - 5.81 MIL/uL   Hemoglobin 13.9 13.0 - 17.0 g/dL   HCT 44.1 39.0  - 52.0 %   MCV 84.5 80.0 - 100.0 fL   MCH 26.6 26.0 - 34.0 pg   MCHC 31.5 30.0 - 36.0 g/dL   RDW 13.9 11.5 - 15.5 %   Platelets 407 (H) 150 - 400 K/uL   nRBC 0.0 0.0 - 0.2 %   Neutrophils Relative % 64 %   Neutro Abs 7.5 1.7 - 7.7 K/uL   Lymphocytes Relative 28 %   Lymphs Abs 3.2 0.7 - 4.0 K/uL   Monocytes Relative 4 %   Monocytes Absolute 0.5 0.1 - 1.0 K/uL   Eosinophils Relative 3 %   Eosinophils Absolute 0.3 0.0 - 0.5 K/uL   Basophils Relative 0 %   Basophils Absolute 0.0 0.0 - 0.1 K/uL   Immature Granulocytes 1 %   Abs Immature Granulocytes 0.06 0.00 - 0.07 K/uL    Comment: Performed at [facility], [address], [place], [place] [address]  Comprehensive metabolic panel     Status: Abnormal   Collection Time: 03/24/18 11:00 AM  Result Value Ref Range   Sodium 137 135 - 145 mmol/L   Potassium 3.4 (L) 3.5 - 5.1 mmol/L   Chloride 105 98 - 111 mmol/L   CO2 23 22 - 32 mmol/L   Glucose, Bld 137 (H) 70 - 99 mg/dL   BUN 9 6 - 20 mg/dL   Creatinine, Ser 0.73 0.61 - 1.24 mg/dL   Calcium 8.8 (L) 8.9 - 10.3 mg/dL   Total Protein 7.6 6.5 - 8.1 g/dL   Albumin 3.4 (L) 3.5 - 5.0 g/dL   AST 24 15 - 41 U/L   ALT 28 0 - 44 U/L   Alkaline Phosphatase 86 38 - 126 U/L   Total Bilirubin 0.6 0.3 - 1.2 mg/dL   GFR calc non Af Amer >60 >60 mL/min   GFR calc Af Amer >60 >60 mL/min    Comment: (NOTE) The eGFR has been calculated using the CKD EPI equation. This calculation has not been validated in all clinical situations. eGFR's persistently <60 mL/min signify possible Chronic Kidney Disease.  Anion gap 9 5 - 15    Comment: Performed at [facility], [address], [place], [place] [address]  Lactate dehydrogenase     Status: None   Collection Time: 03/24/18 11:00 AM  Result Value Ref Range   LDH 162 98 - 192 U/L    Comment: Performed at [facility], [address] [place], [place] [address]  Sedimentation rate     Status: Abnormal   Collection Time: 03/24/18 11:00 AM  Result  Value Ref Range   Sed Rate 42 (H) 0 - 16 mm/hr    Comment: Performed at [facility], [address], [place], [place] [address]  C-reactive protein     Status: Abnormal   Collection Time: 03/24/18 11:00 AM  Result Value Ref Range   CRP 6.2 (H) <1.0 mg/dL    Comment: Performed at [facility], [place] [address], [place], [place] [address]  JAK2 genotypr     Status: None   Collection Time: 03/24/18 11:00 AM  Result Value Ref Range   JAK2 GenotypR Comment     Comment: (NOTE) Result: NEGATIVE for the JAK2 V617F mutation. Interpretation:  The G to T nucleotide change encoding the V617F mutation was not detected.  This result does not rule out the presence of the JAK2 mutation at a level below the sensitivity of detection of this assay, or the presence of other mutations within JAK2 not detected by this assay.  This result does not rule out a diagnosis of polycythemia vera, essential thrombocythemia or idiopathic myelofibrosis as the V617F mutation is not detected in all patients with these disorders.    Director Review, JAK2 Comment     Comment: (NOTE) Constance Goltz, PhD, [facility]               Director, [facility] Rose for [place] and [place], [place]               [phone] This test was developed and its performance characteristics determined by [facility]. It has not been cleared or approved by the Food and Drug Administration. Performed At: [facility] [address] [place], [place] [id number] Nechama Guard MD EK:[id number] Performed At: [facility] [place], [place] [id number] Nechama Guard MD EC:[id number]    BACKGROUND: Comment     Comment: (NOTE) JAK2 is a cytoplasmic tyrosine kinase with a key role in signal transduction from multiple hematopoietic growth factor receptors. A point mutation within exon 14 of the JAK2 gene ([id number]) encoding  a valine to phenylalanine substitution at position 617 of the JAK2 protein (V617F) has been identified in most patients with polycythemia vera, and in about half of those with either essential thrombocythemia or idiopathic myelofibrosis. The V617F has also been detected, although infrequently, in other myeloid disorders such as chronic myelomonocytic leukemia and chronic neutrophilic luekemia. V617F is an acquired mutation that alters a highly conserved valine present in the negative regulatory JH2 domain of the JAK2 protein and is predicted to dysregulate kinase activity. Methodology: Total genomic DNA was extracted and subjected to TaqMan real-time PCR amplification/detection. Two amplification products per sample were monitored by real-time PCR using primers/probes s pecific to JAK2 wild type (WT) and JAK2  mutant V617F. The ABI7900 Absolute Quantitation software will compare the patient specimen valuse to the standard curves and generate percent values for wild type and mutant type. In vitro studies have indicated that this assay has an analytical sensitivity of 1%. References: Baxter EJ, Scott Phineas Real, et al. Acquired mutation of the tyrosine kinase JAK2 in human myeloproliferative disorders. Lancet. 2005 Mar 19-25; 365(9464):1054-1061. Alfonso Ramus Couedic JP. A unique clonal JAK2 mutation leading to constitutive signaling causes polycythaemia vera. Nature. 2005 Apr 28; 434(7037):1144-1148. Kralovics R, Passamonti F, Buser AS, et al. A gain-of-function mutation of JAK2 in myeloproliferative disorders. N Engl J Med. 2005 Apr 28; 352(17):1779-1790.   PSA     Status: None   Collection Time: 05/18/18 11:41 AM  Result Value Ref Range   PSA 0.3 < OR = 4.0 ng/mL    Comment: The total PSA value from this assay system is  standardized against the WHO standard. The test  result will be approximately 20% lower when compared  to the equimolar-standardized total PSA  (Beckman  Coulter). Comparison of serial PSA results should be  interpreted with this fact in mind. . This test was performed using the Siemens  chemiluminescent method. Values obtained from  different assay methods cannot be used interchangeably. PSA levels, regardless of value, should not be interpreted as absolute evidence of the presence or absence of disease.   Testos,Total,Free and SHBG (Male)     Status: Abnormal   Collection Time: 05/18/18 11:41 AM  Result Value Ref Range   Testosterone, Total, LC-MS-MS 105 (L) 250 - 1,100 ng/dL    Comment: . For additional information, please refer to http://education.questdiagnostics.com/faq/ TotalTestosteroneLCMSMSFAQ165 (This link is being provided for informational/ educational purposes only.) . This test was developed and its analytical performance characteristics have been determined by [place], [place]. It has not been cleared or approved by the U.S. Food and Drug Administration. This assay has been validated pursuant to the CLIA regulations and is used for clinical purposes. .    Free Testosterone 23.4 (L) 35.0 - 155.0 pg/mL    Comment: . This test was developed and its analytical performance characteristics have been determined by [place], [place]. It has not been cleared or approved by the U.S. Food and Drug Administration. This assay has been validated pursuant to the CLIA regulations and is used for clinical purposes. .    Sex Hormone Binding 16 10 - 50 nmol/L     Assessment & Plan:   1. Pre-diabetes - I have reviewed his history and evaluated patient clinically. His prediabetes is directly linked to his obesity, see #3 below. His A1c remains stable at 6%.  -He is advised him to continue metformin 500 mg p.o. twice daily-daily after breakfast and supper.     2. Hypogonadism  - Etiology most likely multifactorial including chromosomal  abnormality and history of undescended testes.  - He has required testosterone supplement for several years now. I have reviewed his EMR records and found out that he had low testosterone starting from at least 2012.  -I have not seen FSH/LH levels, would have  been useful prior to initiation of testosterone therapy however the utility of that test now is unremarkable. 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 - His previsit testosterone level is low at 105, after missing 2 cycles of testosterone injection.   -He wishes to continue to receive testosterone supplement.  He is advised to resume testosterone 200 mg IM  every 2 weeks.  Therapeutic goal is to keep his total testosterone above 250.  If he cannot achieve that, would be tried for testosterone 100 mg IM every week.   -I will obtain total testosterone before his next visit.   3. Morbid obesity due to excess calories ([place]) - See #1 above.  This is his most pressing health problem, but patient is not concerned, did not make any significant behavioral change as far as carbs consumptions. - The most likely cause of his morbid obesity is still excess caloric intake.   Cushing's syndrome, hypothyroidism are ruled out as a cause of weight gain.  -  Suggestion is made for him to avoid simple carbohydrates  from his diet including Cakes, Sweet Desserts / Pastries, Ice Cream, Soda (diet and regular), Sweet Tea, Candies, Chips, Cookies, Store Bought Juices, Alcohol in Excess of  1-2 drinks a day, Artificial Sweeteners, and "Sugar-free" Products. This will help patient to have stable blood glucose profile and potentially avoid unintended weight gain.   - Consult with dietitian/diabetes educator Jearld Fenton, CDE in progress. - His mother gathered information on bariatric surgery, however his insurance would not cover.   4. Essential hypertension -His blood pressure is not controlled to target.  He has 3 medications for blood pressure including  chlorthalidone, enalapril, and metoprolol.  He is not sure about chlorthalidone treatment lately.  I discussed and reinitiated the prescription for chlorthalidone 25 mg p.o. daily, along with enalapril 20 mg p.o. daily and metoprolol 75 mg p.o. twice daily.   I discussed and added clonidine 0.1 milligrams p.o. twice daily. I advised him on salt restrictions and to be consistent taking his medications and follow-up with Dr. Legrand Rams for primary care needs.  5. Undescended right testicle - He is status post orchidopexy of right testicle. The details of his surgical history are not available for review.   - Time spent with the patient: 25 min, of which >50% was spent in reviewing his  current and  previous labs, previous treatments, and medications doses and developing a plan for long-term care.  Allen Harding participated in the discussions, expressed understanding, and voiced agreement with the above plans.  All questions were answered to his satisfaction. he is encouraged to contact clinic should he have any questions or concerns prior to his return visit.   Follow up plan: Return in about 3 months (around 08/24/2018) for Follow up with Pre-visit Labs.  Glade Lloyd, MD Phone: [phone]  Fax: [phone]   This note was partially dictated with voice recognition software. Similar sounding words can be transcribed inadequately or may not  be corrected upon review.  05/25/2018, 11:59 AM

## 2018-07-07 IMAGING — US US ABDOMEN COMPLETE
1 series · 14 of 25 positions shown · non-contrast
Comparison: Renal ultrasound dated August 30, 2013.

CLINICAL DATA: Leukocytosis.

EXAM:
ABDOMEN ULTRASOUND COMPLETE

[Series 1: us abdomen complete · 0.24mm/px · 14 of 75 slices shown]
[im 1/75]
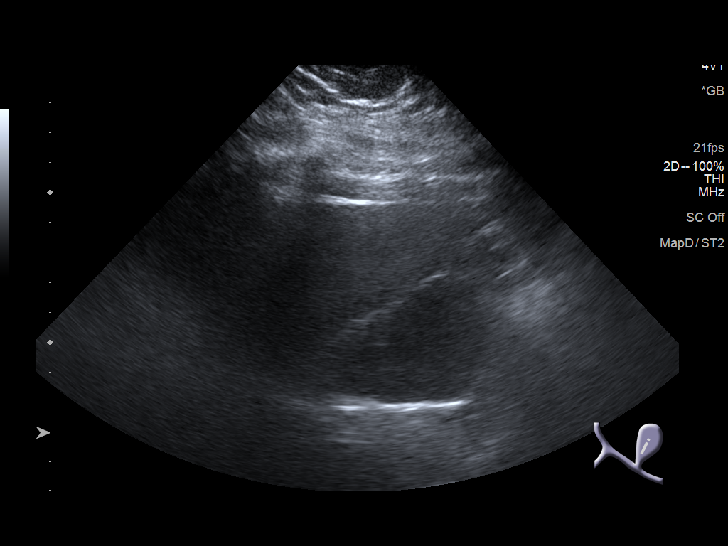
[im 7/75]
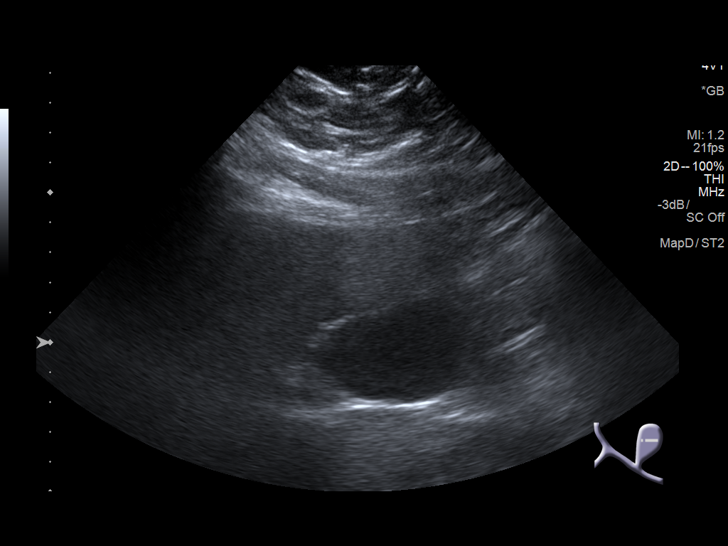
[im 13/75]
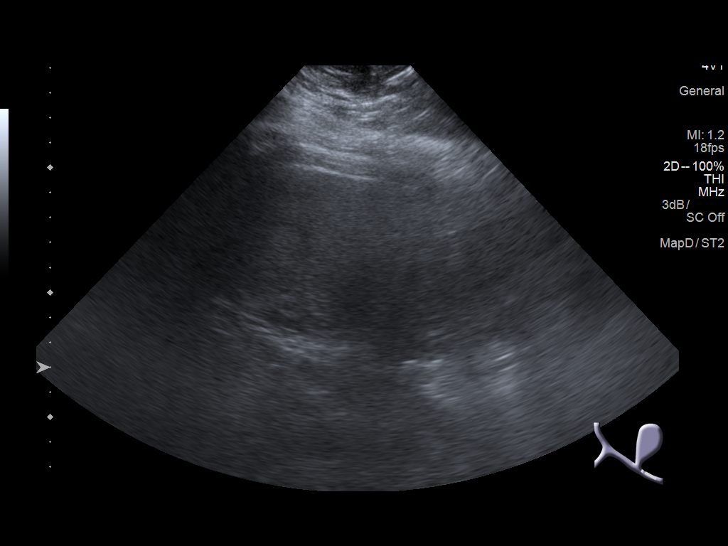
[im 19/75]
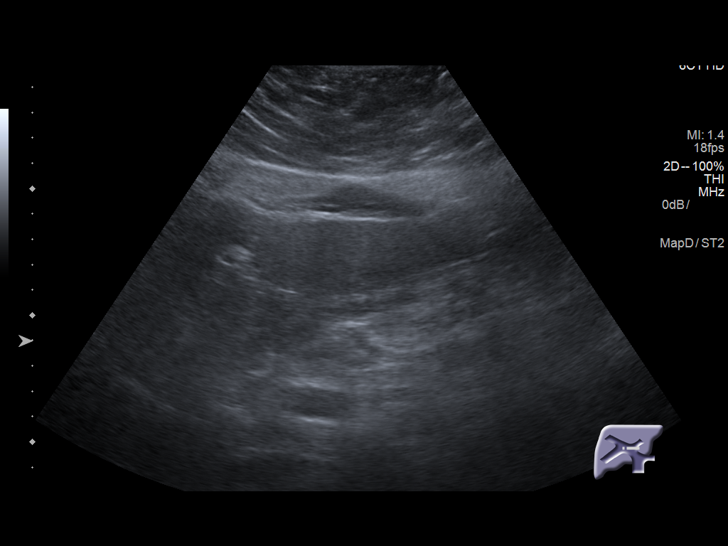
[im 25/75]
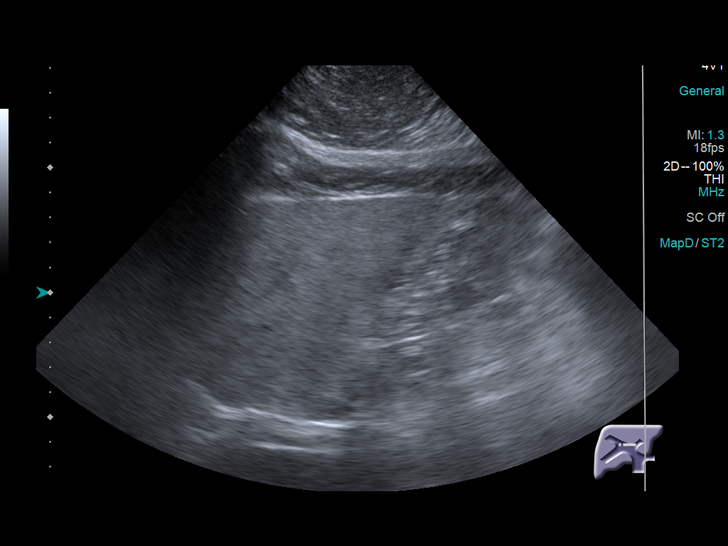
[im 28/75]
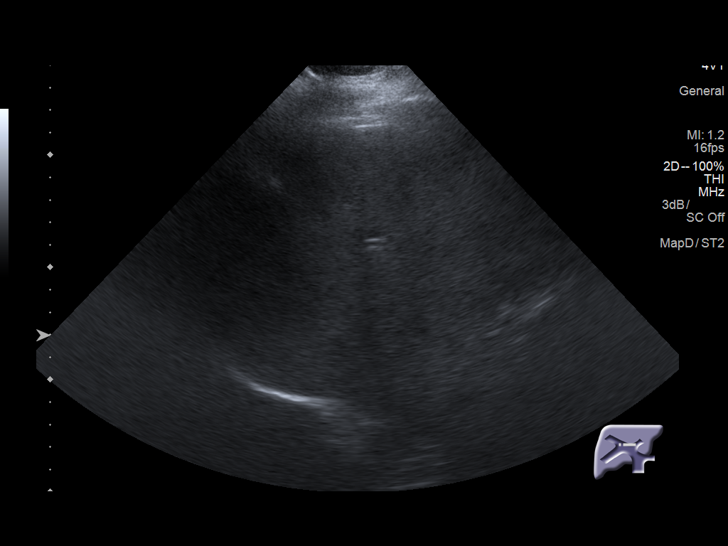
[im 34/75]
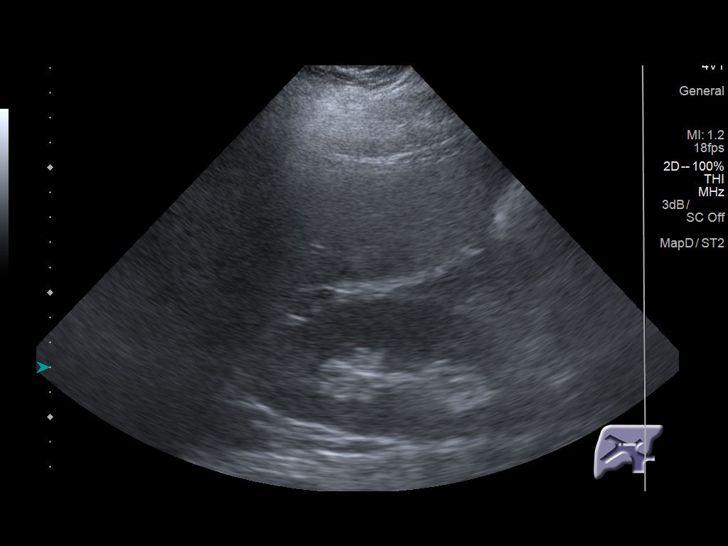
[im 41/75]
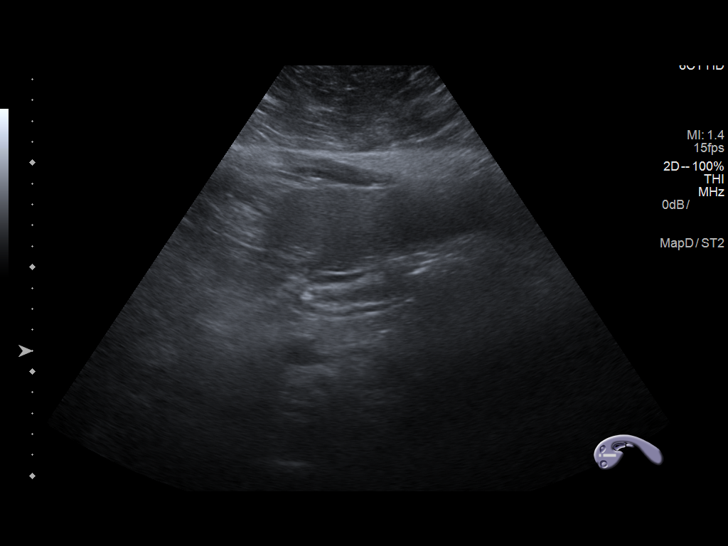
[im 47/75]
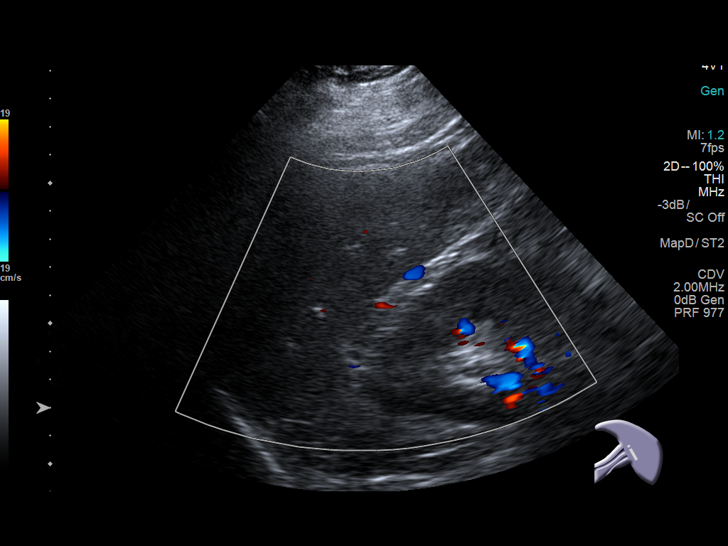
[im 50/75]
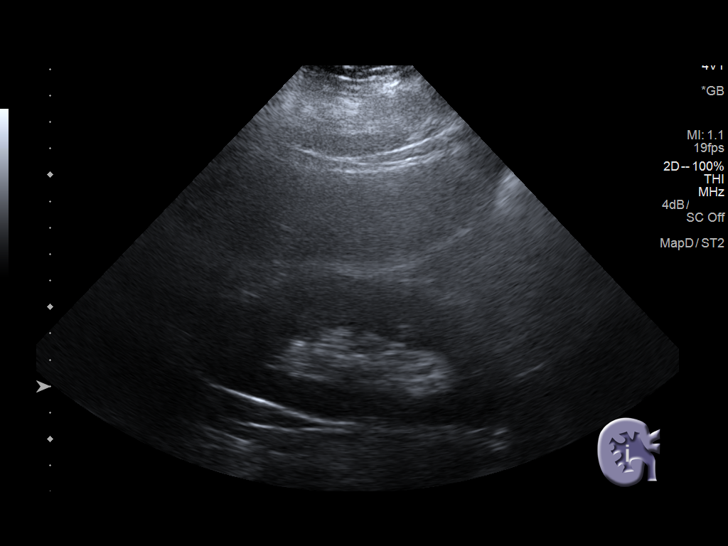
[im 56/75]
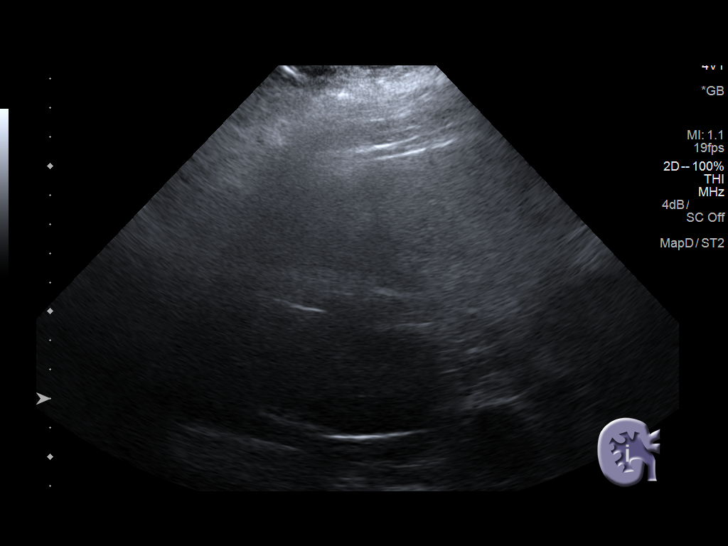
[im 62/75]
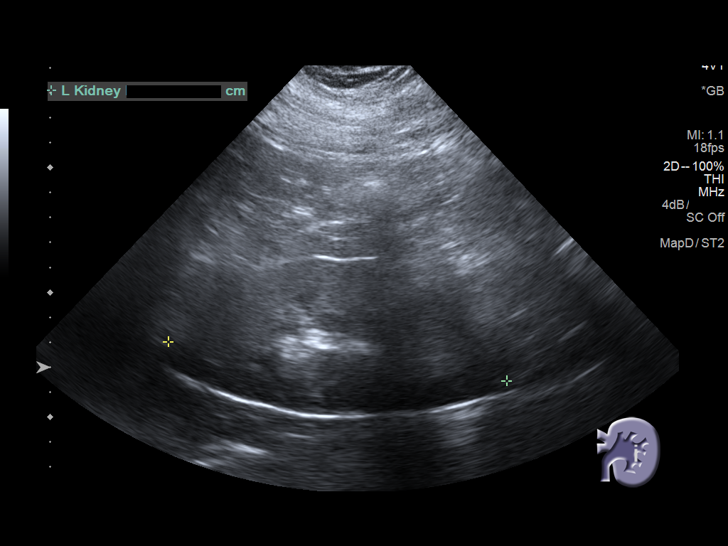
[im 68/75]
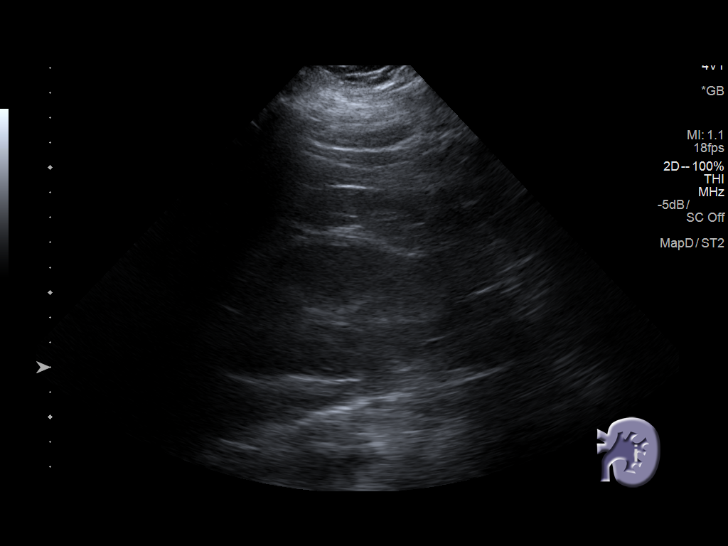
[im 75/75]
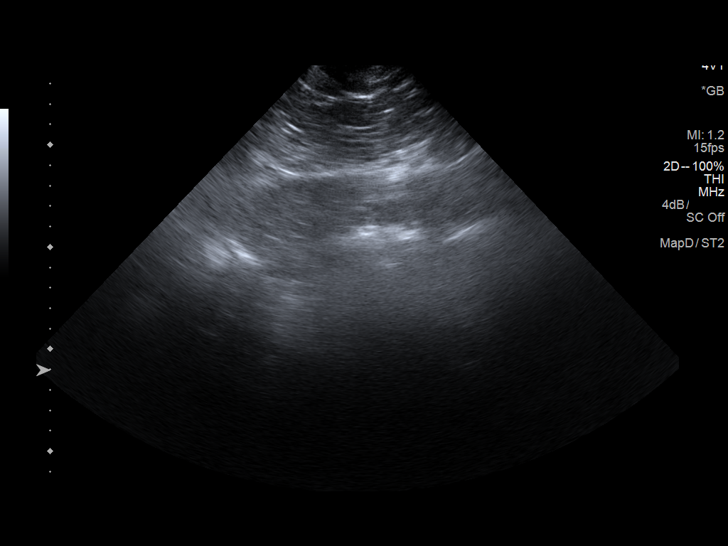

[14 of 25 positions shown; findings below may reference images not displayed]

FINDINGS: Gallbladder: No gallstones or wall thickening visualized. No
sonographic Murphy sign noted by sonographer.

Common bile duct: Diameter: 4 mm, normal.

Liver: No focal lesion identified. Increased parenchymal
echogenicity. Portal vein is patent on color Doppler imaging with
normal direction of blood flow towards the liver.

IVC: No abnormality visualized.

Pancreas: Not well visualized due to overlying bowel gas.

Spleen: Size and appearance within normal limits.

Right Kidney: Length: 12.3 cm. Echogenicity within normal limits. No
mass or hydronephrosis visualized.

Left Kidney: Length: 13.7 cm. Echogenicity within normal limits. No
mass or hydronephrosis visualized.

Abdominal aorta: Not well visualized due to overlying bowel gas.

Other findings: None.
IMPRESSION: Hepatic steatosis.  No acute abnormality.

## 2018-08-27 LAB — COMPLETE METABOLIC PANEL WITH GFR
AG Ratio: 1.1 (calc) (ref 1.0–2.5)
ALT: 28 U/L (ref 9–46)
AST: 22 U/L (ref 10–40)
Albumin: 3.9 g/dL (ref 3.6–5.1)
Alkaline phosphatase (APISO): 95 U/L (ref 36–130)
BILIRUBIN TOTAL: 0.3 mg/dL (ref 0.2–1.2)
BUN: 15 mg/dL (ref 7–25)
CHLORIDE: 102 mmol/L (ref 98–110)
CO2: 25 mmol/L (ref 20–32)
Calcium: 9.5 mg/dL (ref 8.6–10.3)
Creat: 0.72 mg/dL (ref 0.60–1.35)
GFR, Est African American: 152 mL/min/{1.73_m2} (ref >=60)
GFR, Est Non African American: 131 mL/min/{1.73_m2} (ref >=60)
Globulin: 3.4 g/dL (calc) (ref 1.9–3.7)
Glucose, Bld: 111 mg/dL (ref 65–139)
Potassium: 4.1 mmol/L (ref 3.5–5.3)
Sodium: 140 mmol/L (ref 135–146)
Total Protein: 7.3 g/dL (ref 6.1–8.1)

## 2018-08-27 LAB — TESTOSTERONE TOTAL,FREE,BIO, MALES
Albumin: 3.9 g/dL (ref 3.6–5.1)
Sex Hormone Binding: 16 nmol/L (ref 10–50)
Testosterone, Bioavailable: 295.6 ng/dL (ref >=110.0)
Testosterone, Free: 164.6 pg/mL (ref 46.0–224.0)
Testosterone: 631 ng/dL (ref 250–827)

## 2018-08-27 LAB — HEMOGLOBIN A1C
HEMOGLOBIN A1C: 6.5 %{Hb} — AB (ref <5.7)
Mean Plasma Glucose: 140 (calc)
eAG (mmol/L): 7.7 (calc)

## 2018-08-27 LAB — ALDOSTERONE + RENIN ACTIVITY W/ RATIO
ALDO / PRA Ratio: 2.3 Ratio (ref 0.9–28.9)
Aldosterone: 24 ng/dL
Renin Activity: 10.41 ng/mL/h — ABNORMAL HIGH (ref 0.25–5.82)

## 2018-08-27 LAB — MICROALBUMIN / CREATININE URINE RATIO
Creatinine, Urine: 203 mg/dL (ref 20–320)
Microalb Creat Ratio: 86 mcg/mg creat — ABNORMAL HIGH (ref <30)
Microalb, Ur: 17.4 mg/dL

## 2018-08-28 ENCOUNTER — Encounter: Payer: Self-pay | Admitting: "Endocrinology

## 2018-08-28 ENCOUNTER — Ambulatory Visit (INDEPENDENT_AMBULATORY_CARE_PROVIDER_SITE_OTHER): Payer: Medicaid Other | Admitting: "Endocrinology

## 2018-08-28 DIAGNOSIS — E291 Testicular hypofunction: Secondary | ICD-10-CM

## 2018-08-28 DIAGNOSIS — E119 Type 2 diabetes mellitus without complications: Secondary | ICD-10-CM | POA: Diagnosis not present

## 2018-08-28 NOTE — Progress Notes (Signed)
Endocrinology Telephone Visit Follow up Note -During COVID -19 Pandemic    Subjective:    Patient ID: Allen Harding, male    DOB: [birth year], PCP Rosita Fire, MD   Past Medical History:  Diagnosis Date  . ADHD (attention deficit hyperactivity disorder)   . Anxiety   . Autism disorder   . 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 Diabetes mellitus   . Hypertension   . Prediabetes    Past Surgical History:  Procedure Laterality Date  . CIRCUMCISION    . CIRCUMCISION REVISION    . FRENULECTOMY, LINGUAL    . lipoma    . ORCHIOPEXY     Social History   Socioeconomic History  . Marital status: Single    Spouse name: Not on file  . Number of children: Not on file  . Years of education: Not on file  . Highest education level: Not on file  Occupational History  . Not on file  Social Needs  . Financial resource strain: Not on file  . Food insecurity:    Worry: Not on file    Inability: Not on file  . Transportation needs:    Medical: Not on file    Non-medical: Not on file  Tobacco Use  . Smoking status: Former Smoker    Packs/day: 0.25    Last attempt to quit: 01/09/2014    Years since quitting: 4.6  . Smokeless tobacco: Never Used  Substance and Sexual Activity  . Alcohol use: No  . Drug use: No  . Sexual activity: Not on file  Lifestyle  . Physical activity:    Days per week: Not on file    Minutes per session: Not on file  . Stress: Not on file  Relationships  . Social connections:    Talks on phone: Not on file    Gets together: Not on file    Attends religious service: Not on file    Active member of club or organization: Not on file    Attends meetings of clubs or organizations: Not on file    Relationship status: Not on file  Other Topics Concern  . Not on file  Social History Narrative   Lives with mom, sister, 2 nieces, grandparents and sister's fiance.    Outpatient  Encounter Medications as of 08/28/2018  Medication Sig  . atomoxetine (STRATTERA) 40 MG capsule Take 80 mg by mouth daily.  . cetirizine (ZYRTEC) 10 MG tablet TAKE ONE TABLET BY MOUTH DAILY.  . chlorthalidone (HYGROTON) 25 MG tablet Take 1 tablet (25 mg total) by mouth daily.  . cloNIDine (CATAPRES) 0.1 MG tablet Take 1 tablet (0.1 mg total) by mouth 2 (two) times daily.  . enalapril (VASOTEC) 20 MG tablet Take 1 tablet (20 mg total) by mouth daily.  Marland Kitchen escitalopram (LEXAPRO) 10 MG tablet Take 10 mg by mouth daily.  Marland Kitchen guanFACINE (INTUNIV) 2 MG TB24 Take 2 mg by mouth daily.   Marland Kitchen letrozole (FEMARA) 2.5 MG tablet Take 1 tablet (2.5 mg total) by mouth daily.  . metFORMIN (GLUCOPHAGE) 500 MG tablet Take 1 tablet (500 mg total) by mouth 2 (two) times daily with a meal.  .  Metoprolol Tartrate 75 MG TABS Take 75 mg by mouth 2 (two) times daily.  Marland Kitchen omeprazole (PRILOSEC) 40 MG capsule Take 1 capsule (40 mg total) by mouth 2 (two) times daily.  Marland Kitchen oxybutynin (DITROPAN-XL) 10 MG 24 hr tablet TAKE 1 TABLET BY MOUTH DAILY.  Marland Kitchen sertraline (ZOLOFT) 25 MG tablet Take 100 mg by mouth daily. Reported on 06/16/2015  . SYRINGE-NEEDLE, DISP, 3 ML (BD ECLIPSE SYRINGE) 21G X 1" 3 ML MISC by Does not apply route.  Marland Kitchen testosterone cypionate (DEPOTESTOSTERONE CYPIONATE) 200 MG/ML injection Inject 1 mL (200 mg total) into the muscle every 14 (fourteen) days.  . traZODone (DESYREL) 100 MG tablet Take 50 mg by mouth at bedtime. Reported on 06/16/2015  . [DISCONTINUED] oxybutynin (DITROPAN) 5 MG tablet Take 10 mg by mouth Nightly.    No facility-administered encounter medications on file as of 08/28/2018.    ALLERGIES: No Known Allergies VACCINATION STATUS: Immunization History  Administered Date(s) Administered  . DTaP 06/11/1995, 08/06/1995, 09/24/1995, 06/30/1996, 09/24/1999  . H1N1 11/24/2008  . HPV Quadrivalent 02/08/2014  . Hepatitis A, Ped/Adol-2 Dose 02/03/2013, 08/19/2013  . Hepatitis B 05/28/1995, 05/07/1995,  09/24/1995  . HiB (PRP-OMP) 06/11/1995, 08/06/1995, 09/24/1995, 06/30/1996  . IPV 06/11/1995, 08/06/1995, 09/24/1995, 09/24/1999  . Influenza Whole 03/22/2009, 06/17/2011, 07/23/2012  . Influenza,inj,Quad PF,6+ Mos 05/02/2015  . MMR 03/31/1996, 09/24/1999  . Meningococcal Conjugate 11/24/2008, 08/19/2013  . Td 01/07/2006  . Tdap 01/07/2006  . Varicella 03/31/1996, 06/01/1997    Diabetes    23 year old male patient with medical history as above. 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   - He has been following at [facility] until March 2017 with Dr. Baldo Ash. -He does not know for sure when he started testosterone therapy currently at 200 mg IM every 2 weeks.  He reports better consistency of his testosterone injection at this time.    -He has dealt with heavy weight almost all of his life, admits to dietary indiscretions including consumption of large quantities of sweetened beverages.  His previsit labs show A1c of 6.5%, converting to type 2 diabetes.  He was given a prescription for metformin 500 mg p.o. twice daily, taking it on and off.   -He has no acute complaints today. -He underwent orchidopexy for right-sided undescended testes. -He denies testicular injury, radiation, infection, STD. -He denies any history of head injury.    Objective:    There were no vitals taken for this visit.  Wt Readings from Last 3 Encounters:  05/25/18 (!) 415 lb (188.2 kg)  03/31/18 (!) 400 lb 9.6 oz (181.7 kg)  01/22/18 (!) 405 lb (183.7 kg)      CMP     Component Value Date/Time   NA 140 08/21/2018 1149   K 4.1 08/21/2018 1149   CL 102 08/21/2018 1149   CO2 25 08/21/2018 1149   GLUCOSE 111 08/21/2018 1149   BUN 15 08/21/2018 1149   CREATININE 0.72 08/21/2018 1149   CALCIUM 9.5 08/21/2018 1149   PROT 7.3 08/21/2018 1149   ALBUMIN  3.4 (L) 03/24/2018 1100   AST 22 08/21/2018 1149   ALT 28 08/21/2018 1149   ALKPHOS 86 03/24/2018 1100   BILITOT 0.3 08/21/2018 1149   GFRNONAA 131 08/21/2018 1149   GFRAA 152 08/21/2018 1149   Diabetic Labs (most recent): Lab Results  Component Value Date   HGBA1C 6.5 (H) 08/21/2018   HGBA1C 6.0 (H) 01/15/2018  HGBA1C 6.0 (H) 05/14/2017    Lipid Panel     Component Value Date/Time   CHOL 222 (H) 08/08/2015 1152   TRIG 105 08/08/2015 1152   HDL 46 08/08/2015 1152   CHOLHDL 4.8 08/08/2015 1152   VLDL 21 08/08/2015 1152   LDLCALC 155 (H) 08/08/2015 1152   Results for ZECHARIAH, BISSONNETTE (MRN [id number]) as of 08/28/2018 10:33  Ref. Range 08/21/2018 11:49  ALDOSTERONE Latest Ref Range:  ng/dL 24  ALDO / PRA Ratio Latest Ref Range: 0.9 - 28.9 Ratio 2.3  eAG (mmol/L) Latest Units: (calc) 7.7  Glucose Latest Ref Range: 65 - 139 mg/dL 111  Hemoglobin A1C Latest Ref Range: <5.7 % of total Hgb 6.5 (H)  Sex Horm Binding Glob, Serum Latest Ref Range: 10 - 50 nmol/L 16  Testosterone Latest Ref Range: 250 - 827 ng/dL 631  Testosterone, Bioavailable Latest Ref Range: 110.0 - 575 ng/dL 295.6  Testosterone Free Latest Ref Range: 46.0 - 224.0 pg/mL 164.6     Assessment & Plan:   1.  Type 2 diabetes -new diagnosis  -His previsit labs show A1c of 6.5%.  Is advised to be more consistent taking his metformin 500 mg p.o. twice daily-daily after breakfast and after supper.    - Patient admits there is a room for improvement in his diet and drink choices. -  Suggestion is made for him to avoid simple carbohydrates  from his diet including Cakes, Sweet Desserts / Pastries, Ice Cream, Soda (diet and regular), Sweet Tea, Candies, Chips, Cookies, Store Bought Juices, Alcohol in Excess of  1-2 drinks a day, Artificial Sweeteners, and "Sugar-free" Products. This will help patient to have stable blood glucose profile and potentially avoid unintended weight gain.  2. Hypogonadism  - Etiology most likely  multifactorial including chromosomal abnormality and history of undescended testes.  - He has required testosterone supplement for several years now. I have reviewed his EMR records and found out that he had low testosterone starting from at least 2012.  -I have not seen FSH/LH levels, would have  been useful prior to initiation of testosterone therapy however the utility of that test now is unremarkable. 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 His previsit labs show testosterone level improving to 631 from 234 during his last visit.   -He wishes to continue to receive testosterone supplement.  He is advised to continue and be consistent taking his testosterone 200 mg IM every 2 weeks.  Therapeutic goal is to keep his total testosterone above 250.  If he cannot achieve that, would be tried for testosterone 100 mg IM every week.   -He will need repeat testosterone measurement along with CBC during his next visit.   5. Undescended right testicle - He is status post orchidopexy of right testicle. The details of his surgical history are not available for review.  - Patient Care Time Today:  25 min, of which >50% was spent in reviewing his  current and  previous labs/studies, previous treatments, and medications doses and developing a plan for long-term care based on the latest recommendations for standards of care.  Allen Harding participated in the discussions, expressed understanding, and voiced agreement with the above plans.  All questions were answered to his satisfaction. he is encouraged to contact clinic should he have any questions or concerns prior to his return visit.  Follow up plan: Return in about 4 months (around 12/28/2018) for Follow up with Pre-visit Labs.  Glade Lloyd, MD Phone: [phone]  Fax:  [phone]   This note was partially dictated with voice recognition software. Similar sounding words can be transcribed inadequately or may not  be corrected upon  review.  08/28/2018, 9:19 AM

## 2018-08-28 NOTE — Patient Instructions (Signed)

## 2018-12-28 ENCOUNTER — Ambulatory Visit: Payer: Self-pay | Admitting: "Endocrinology

## 2019-01-04 ENCOUNTER — Other Ambulatory Visit: Payer: Self-pay | Admitting: "Endocrinology

## 2019-01-07 ENCOUNTER — Encounter: Payer: Self-pay | Admitting: "Endocrinology

## 2019-01-07 ENCOUNTER — Ambulatory Visit (INDEPENDENT_AMBULATORY_CARE_PROVIDER_SITE_OTHER): Payer: Medicaid Other | Admitting: "Endocrinology

## 2019-01-07 ENCOUNTER — Other Ambulatory Visit: Payer: Self-pay

## 2019-01-07 DIAGNOSIS — E291 Testicular hypofunction: Secondary | ICD-10-CM | POA: Diagnosis not present

## 2019-01-07 DIAGNOSIS — I10 Essential (primary) hypertension: Secondary | ICD-10-CM

## 2019-01-07 DIAGNOSIS — Z7985 Long-term (current) use of injectable non-insulin antidiabetic drugs: Secondary | ICD-10-CM | POA: Insufficient documentation

## 2019-01-07 DIAGNOSIS — E119 Type 2 diabetes mellitus without complications: Secondary | ICD-10-CM | POA: Insufficient documentation

## 2019-01-07 LAB — HEMOGLOBIN A1C
Hgb A1c MFr Bld: 5.9 % of total Hgb — ABNORMAL HIGH (ref <5.7)
Mean Plasma Glucose: 123 (calc)
eAG (mmol/L): 6.8 (calc)

## 2019-01-07 LAB — CBC WITH DIFFERENTIAL/PLATELET
Absolute Monocytes: 880 cells/uL (ref 200–950)
Basophils Absolute: 57 cells/uL (ref 0–200)
Basophils Relative: 0.4 %
Eosinophils Absolute: 355 cells/uL (ref 15–500)
Eosinophils Relative: 2.5 %
HCT: 40.5 % (ref 38.5–50.0)
Hemoglobin: 13.3 g/dL (ref 13.2–17.1)
Lymphs Abs: 4487 cells/uL — ABNORMAL HIGH (ref 850–3900)
MCH: 26.3 pg — ABNORMAL LOW (ref 27.0–33.0)
MCHC: 32.8 g/dL (ref 32.0–36.0)
MCV: 80.2 fL (ref 80.0–100.0)
MPV: 8.9 fL (ref 7.5–12.5)
Monocytes Relative: 6.2 %
Neutro Abs: 8421 cells/uL — ABNORMAL HIGH (ref 1500–7800)
Neutrophils Relative %: 59.3 %
Platelets: 415 10*3/uL — ABNORMAL HIGH (ref 140–400)
RBC: 5.05 10*6/uL (ref 4.20–5.80)
RDW: 14.5 % (ref 11.0–15.0)
Total Lymphocyte: 31.6 %
WBC: 14.2 10*3/uL — ABNORMAL HIGH (ref 3.8–10.8)

## 2019-01-07 LAB — TESTOS,TOTAL,FREE AND SHBG (FEMALE)
Free Testosterone: 25.3 pg/mL — ABNORMAL LOW (ref 35.0–155.0)
Sex Hormone Binding: 15 nmol/L (ref 10–50)
Testosterone, Total, LC-MS-MS: 132 ng/dL — ABNORMAL LOW (ref 250–1100)

## 2019-01-07 LAB — PSA: PSA: 0.3 ng/mL (ref <=4.0)

## 2019-01-07 NOTE — Progress Notes (Signed)
01/07/2019                                Endocrinology Telehealth Visit Follow up Note -During COVID -19 Pandemic  I connected with Allen Harding on 01/07/2019   by telephone and verified that I am speaking with the correct person using two identifiers. Allen Harding, 01/16/1996. he has verbally consented to this visit. All issues noted in this document were discussed and addressed. The format was not optimal for physical exam.   Subjective:    Patient ID: Allen Harding, male    DOB: 08/27/1995, PCP Rosita Fire, MD   Past Medical History:  Diagnosis Date  . ADHD (attention deficit hyperactivity disorder)   . Anxiety   . Autism disorder   . 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 Diabetes mellitus   . Hypertension   . Prediabetes    Past Surgical History:  Procedure Laterality Date  . CIRCUMCISION    . CIRCUMCISION REVISION    . FRENULECTOMY, LINGUAL    . lipoma    . ORCHIOPEXY     Social History   Socioeconomic History  . Marital status: Single    Spouse name: Not on file  . Number of children: Not on file  . Years of education: Not on file  . Highest education level: Not on file  Occupational History  . Not on file  Social Needs  . Financial resource strain: Not on file  . Food insecurity    Worry: Not on file    Inability: Not on file  . Transportation needs    Medical: Not on file    Non-medical: Not on file  Tobacco Use  . Smoking status: Former Smoker    Packs/day: 0.25    Quit date: 01/09/2014    Years since quitting: 4.9  . Smokeless tobacco: Never Used  Substance and Sexual Activity  . Alcohol use: No  . Drug use: No  . Sexual activity: Not on file  Lifestyle  . Physical activity    Days per week: Not on file    Minutes per session: Not on file  . Stress: Not on file  Relationships  . Social Herbalist on phone: Not on file    Gets together: Not on file    Attends religious service: Not on file    Active  member of club or organization: Not on file    Attends meetings of clubs or organizations: Not on file    Relationship status: Not on file  Other Topics Concern  . Not on file  Social History Narrative   Lives with mom, sister, 2 nieces, grandparents and sister's fiance.    Outpatient Encounter Medications as of 01/07/2019  Medication Sig  . atomoxetine (STRATTERA) 40 MG capsule Take 80 mg by mouth daily.  . cetirizine (ZYRTEC) 10 MG tablet TAKE ONE TABLET BY MOUTH DAILY.  . chlorthalidone (HYGROTON) 25 MG tablet Take 1 tablet (25 mg total) by mouth daily.  . cloNIDine (CATAPRES) 0.1 MG tablet Take 1 tablet (0.1 mg total) by mouth 2 (two) times daily.  . enalapril (VASOTEC) 20 MG tablet TAKE (1) TABLET BY MOUTH ONCE DAILY.  Marland Kitchen escitalopram (LEXAPRO) 10 MG tablet Take 10 mg by mouth daily.  Marland Kitchen guanFACINE (INTUNIV) 2 MG TB24 Take 2 mg by mouth daily.   Marland Kitchen letrozole (FEMARA) 2.5 MG tablet  Take 1 tablet (2.5 mg total) by mouth daily.  . metFORMIN (GLUCOPHAGE) 500 MG tablet Take 1 tablet (500 mg total) by mouth 2 (two) times daily with a meal.  . Metoprolol Tartrate 75 MG TABS Take 75 mg by mouth 2 (two) times daily.  Marland Kitchen omeprazole (PRILOSEC) 40 MG capsule Take 1 capsule (40 mg total) by mouth 2 (two) times daily.  Marland Kitchen oxybutynin (DITROPAN-XL) 10 MG 24 hr tablet TAKE 1 TABLET BY MOUTH DAILY.  Marland Kitchen sertraline (ZOLOFT) 25 MG tablet Take 100 mg by mouth daily. Reported on 06/16/2015  . SYRINGE-NEEDLE, DISP, 3 ML (BD ECLIPSE SYRINGE) 21G X 1" 3 ML MISC by Does not apply route.  Marland Kitchen testosterone cypionate (DEPOTESTOSTERONE CYPIONATE) 200 MG/ML injection Inject 1 mL (200 mg total) into the muscle every 14 (fourteen) days.  . traZODone (DESYREL) 100 MG tablet Take 50 mg by mouth at bedtime. Reported on 06/16/2015   No facility-administered encounter medications on file as of 01/07/2019.    ALLERGIES: No Known Allergies VACCINATION STATUS: Immunization History  Administered Date(s) Administered  . DTaP  06/11/1995, 08/06/1995, 09/24/1995, 06/30/1996, 09/24/1999  . H1N1 11/24/2008  . HPV Quadrivalent 02/08/2014  . Hepatitis A, Ped/Adol-2 Dose 02/03/2013, 08/19/2013  . Hepatitis B 1994/12/16, 05/07/1995, 09/24/1995  . HiB (PRP-OMP) 06/11/1995, 08/06/1995, 09/24/1995, 06/30/1996  . IPV 06/11/1995, 08/06/1995, 09/24/1995, 09/24/1999  . Influenza Whole 03/22/2009, 06/17/2011, 07/23/2012  . Influenza,inj,Quad PF,6+ Mos 05/02/2015  . MMR 03/31/1996, 09/24/1999  . Meningococcal Conjugate 11/24/2008, 08/19/2013  . Td 01/07/2006  . Tdap 01/07/2006  . Varicella 03/31/1996, 06/01/1997    Diabetes   23 year old male patient with medical history as above. 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   - He has been following at [facility] until March 2017 with Dr. Baldo Ash. -He does not know for sure when he started testosterone therapy currently at 200 mg IM every 2 weeks.  He reports better consistency of his Injection at this time.   His previsit labs show below  target total testosterone at 132. -He has dealt with heavy weight almost all of his life, admits to dietary indiscretions including consumption of large quantities of sweetened beverages.  His previsit labs show A1c of 5.9% improving from 6.5%, currently on metformin 500 mg p.o. twice daily.    -He has no acute complaints today. -He underwent orchidopexy for right-sided undescended testes. -He denies testicular injury, radiation, infection, STD. -He denies any history of head injury.    Objective:    There were no vitals taken for this visit.  Wt Readings from Last 3 Encounters:  05/25/18 (!) 415 lb (188.2 kg)  03/31/18 (!) 400 lb 9.6 oz (181.7 kg)  01/22/18 (!) 405 lb (183.7 kg)      CMP     Component Value Date/Time   NA 140 08/21/2018 1149   K 4.1 08/21/2018 1149   CL 102  08/21/2018 1149   CO2 25 08/21/2018 1149   GLUCOSE 111 08/21/2018 1149   BUN 15 08/21/2018 1149   CREATININE 0.72 08/21/2018 1149   CALCIUM 9.5 08/21/2018 1149   PROT 7.3 08/21/2018 1149   ALBUMIN 3.4 (L) 03/24/2018 1100   AST 22 08/21/2018 1149   ALT 28 08/21/2018 1149   ALKPHOS 86 03/24/2018 1100   BILITOT 0.3 08/21/2018 1149   GFRNONAA 131 08/21/2018 1149   GFRAA 152 08/21/2018 1149   Diabetic Labs (most recent): Lab Results  Component Value  Date   HGBA1C 5.9 (H) 01/04/2019   HGBA1C 6.5 (H) 08/21/2018   HGBA1C 6.0 (H) 01/15/2018    Lipid Panel     Component Value Date/Time   CHOL 222 (H) 08/08/2015 1152   TRIG 105 08/08/2015 1152   HDL 46 08/08/2015 1152   CHOLHDL 4.8 08/08/2015 1152   VLDL 21 08/08/2015 1152   LDLCALC 155 (H) 08/08/2015 1152   Recent Results (from the past 2160 hour(s))  Hemoglobin A1c     Status: Abnormal   Collection Time: 01/04/19 10:26 AM  Result Value Ref Range   Hgb A1c MFr Bld 5.9 (H) <5.7 % of total Hgb    Comment: For someone without known diabetes, a hemoglobin  A1c value between 5.7% and 6.4% is consistent with prediabetes and should be confirmed with a  follow-up test. . For someone with known diabetes, a value <7% indicates that their diabetes is well controlled. A1c targets should be individualized based on duration of diabetes, age, comorbid conditions, and other considerations. . This assay result is consistent with an increased risk of diabetes. . Currently, no consensus exists regarding use of hemoglobin A1c for diagnosis of diabetes for children. .    Mean Plasma Glucose 123 (calc)   eAG (mmol/L) 6.8 (calc)  CBC with Differential/Platelet     Status: Abnormal   Collection Time: 01/04/19 10:26 AM  Result Value Ref Range   WBC 14.2 (H) 3.8 - 10.8 Thousand/uL   RBC 5.05 4.20 - 5.80 Million/uL   Hemoglobin 13.3 13.2 - 17.1 g/dL   HCT 40.5 38.5 - 50.0 %   MCV 80.2 80.0 - 100.0 fL   MCH 26.3 (L) 27.0 - 33.0 pg   MCHC  32.8 32.0 - 36.0 g/dL   RDW 14.5 11.0 - 15.0 %   Platelets 415 (H) 140 - 400 Thousand/uL   MPV 8.9 7.5 - 12.5 fL   Neutro Abs 8,421 (H) 1,500 - 7,800 cells/uL   Lymphs Abs 4,487 (H) 850 - 3,900 cells/uL   Absolute Monocytes 880 200 - 950 cells/uL   Eosinophils Absolute 355 15 - 500 cells/uL   Basophils Absolute 57 0 - 200 cells/uL   Neutrophils Relative % 59.3 %   Total Lymphocyte 31.6 %   Monocytes Relative 6.2 %   Eosinophils Relative 2.5 %   Basophils Relative 0.4 %  PSA     Status: None   Collection Time: 01/04/19 10:26 AM  Result Value Ref Range   PSA 0.3 < OR = 4.0 ng/mL    Comment: The total PSA value from this assay system is  standardized against the WHO standard. The test  result will be approximately 20% lower when compared  to the equimolar-standardized total PSA (Beckman  Coulter). Comparison of serial PSA results should be  interpreted with this fact in mind. . This test was performed using the Siemens  chemiluminescent method. Values obtained from  different assay methods cannot be used interchangeably. PSA levels, regardless of value, should not be interpreted as absolute evidence of the presence or absence of disease.   Testos,Total,Free and SHBG (Male)     Status: Abnormal   Collection Time: 01/04/19 10:26 AM  Result Value Ref Range   Testosterone, Total, LC-MS-MS 132 (L) 250 - 1,100 ng/dL    Comment: . For additional information, please refer to http://education.questdiagnostics.com/faq/ TotalTestosteroneLCMSMSFAQ165 (This link is being provided for informational/ educational purposes only.) . This test was developed and its analytical performance characteristics have been determined by [employer]  [place], [place]. It has not been cleared or approved by the U.S. Food and Drug Administration. This assay has been validated pursuant to the CLIA regulations and is used for clinical purposes. .    Free Testosterone 25.3 (L)  35.0 - 155.0 pg/mL    Comment: . This test was developed and its analytical performance characteristics have been determined by [place], [place]. It has not been cleared or approved by the U.S. Food and Drug Administration. This assay has been validated pursuant to the CLIA regulations and is used for clinical purposes. .    Sex Hormone Binding 15 10 - 50 nmol/L       Assessment & Plan:   1.  Type 2 diabetes -new diagnosis  -His previsit labs show A1c of 5.9%.  He is benefiting and responding to metformin treatment.  He is advised to continue metformin 500 mg p.o. twice daily-daily after breakfast and after supper.    - he  admits there is a room for improvement in his diet and drink choices. -  Suggestion is made for him to avoid simple carbohydrates  from his diet including Cakes, Sweet Desserts / Pastries, Ice Cream, Soda (diet and regular), Sweet Tea, Candies, Chips, Cookies, Sweet Pastries,  Store Bought Juices, Alcohol in Excess of  1-2 drinks a day, Artificial Sweeteners, Coffee Creamer, and "Sugar-free" Products. This will help patient to have stable blood glucose profile and potentially avoid unintended weight gain.   2. Hypogonadism  - Etiology most likely multifactorial including chromosomal abnormality and history of undescended testes.  - He has required testosterone supplement for several years now. I have reviewed his EMR records and found out that he had low testosterone starting from at least 2012.  -I have not seen FSH/LH levels, would have  been useful prior to initiation of testosterone therapy however the utility of that test now is unremarkable. 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 His previsit labs show testosterone level of 132, dropping from 631 during his last visit.    -He wishes to continue to receive testosterone supplement.  He is advised to change his testosterone regimen as follow:  100 mg IM every 7  days. Therapeutic goal is to keep his total testosterone above 250.  -He will need repeat testosterone measurement along with CBC during his next visit.   3. Undescended right testicle - He is status post orchidopexy of right testicle. The details of his surgical history are not available for review.  4) hypertension he is advised to home monitor blood pressure and report if > 140/90 on 2 separate readings. -He is advised to continue his current blood pressure medications including enalapril 20 mg p.o. daily, clonidine 0.5 mg p.o. twice daily, chlorthalidone 25 mg daily.   - Patient Care Time Today:  25 min, of which >50% was spent in  counseling and the rest reviewing his  current and  previous labs/studies, previous treatments, his blood glucose readings, and medications' doses and developing a plan for long-term care based on the latest recommendations for standards of care.   Allen Harding participated in the discussions, expressed understanding, and voiced agreement with the above plans.  All questions were answered to his satisfaction. he is encouraged to contact clinic should he have any questions or concerns prior to his return visit.   Follow up plan: Return in about 4 months (around 05/09/2019) for Follow up with Pre-visit Labs.  Glade Lloyd, MD Phone: [phone]  Fax: [phone]  This note was partially dictated with voice recognition software. Similar sounding words can be transcribed inadequately or may not  be corrected upon review.  01/07/2019, 3:22 PM

## 2019-03-22 ENCOUNTER — Other Ambulatory Visit (HOSPITAL_COMMUNITY): Payer: Self-pay | Admitting: *Deleted

## 2019-03-22 DIAGNOSIS — D696 Thrombocytopenia, unspecified: Secondary | ICD-10-CM

## 2019-03-22 DIAGNOSIS — D72829 Elevated white blood cell count, unspecified: Secondary | ICD-10-CM

## 2019-03-22 DIAGNOSIS — D500 Iron deficiency anemia secondary to blood loss (chronic): Secondary | ICD-10-CM

## 2019-03-22 DIAGNOSIS — D649 Anemia, unspecified: Secondary | ICD-10-CM

## 2019-03-23 ENCOUNTER — Inpatient Hospital Stay (HOSPITAL_COMMUNITY): Payer: Medicaid Other | Attending: Hematology

## 2019-03-23 ENCOUNTER — Other Ambulatory Visit: Payer: Self-pay

## 2019-03-23 DIAGNOSIS — D72829 Elevated white blood cell count, unspecified: Secondary | ICD-10-CM | POA: Diagnosis not present

## 2019-03-23 DIAGNOSIS — D500 Iron deficiency anemia secondary to blood loss (chronic): Secondary | ICD-10-CM

## 2019-03-23 DIAGNOSIS — D649 Anemia, unspecified: Secondary | ICD-10-CM

## 2019-03-23 DIAGNOSIS — Z23 Encounter for immunization: Secondary | ICD-10-CM | POA: Insufficient documentation

## 2019-03-23 DIAGNOSIS — D696 Thrombocytopenia, unspecified: Secondary | ICD-10-CM

## 2019-03-23 LAB — C-REACTIVE PROTEIN: CRP: 4.3 mg/dL — ABNORMAL HIGH (ref <1.0)

## 2019-03-23 LAB — COMPREHENSIVE METABOLIC PANEL
ALT: 27 U/L (ref 0–44)
AST: 24 U/L (ref 15–41)
Albumin: 3.5 g/dL (ref 3.5–5.0)
Alkaline Phosphatase: 79 U/L (ref 38–126)
Anion gap: 12 (ref 5–15)
BUN: 8 mg/dL (ref 6–20)
CO2: 28 mmol/L (ref 22–32)
Calcium: 8.8 mg/dL — ABNORMAL LOW (ref 8.9–10.3)
Chloride: 99 mmol/L (ref 98–111)
Creatinine, Ser: 0.77 mg/dL (ref 0.61–1.24)
GFR calc Af Amer: >60 mL/min (ref >60)
GFR calc non Af Amer: >60 mL/min (ref >60)
Glucose, Bld: 167 mg/dL — ABNORMAL HIGH (ref 70–99)
Potassium: 3.2 mmol/L — ABNORMAL LOW (ref 3.5–5.1)
Sodium: 139 mmol/L (ref 135–145)
Total Bilirubin: 0.4 mg/dL (ref 0.3–1.2)
Total Protein: 7.6 g/dL (ref 6.5–8.1)

## 2019-03-23 LAB — CBC WITH DIFFERENTIAL/PLATELET
Abs Immature Granulocytes: 0.05 10*3/uL (ref 0.00–0.07)
Basophils Absolute: 0 10*3/uL (ref 0.0–0.1)
Basophils Relative: 0 %
Eosinophils Absolute: 0.3 10*3/uL (ref 0.0–0.5)
Eosinophils Relative: 2 %
HCT: 46.5 % (ref 39.0–52.0)
Hemoglobin: 14.3 g/dL (ref 13.0–17.0)
Immature Granulocytes: 0 %
Lymphocytes Relative: 30 %
Lymphs Abs: 3.7 10*3/uL (ref 0.7–4.0)
MCH: 26 pg (ref 26.0–34.0)
MCHC: 30.8 g/dL (ref 30.0–36.0)
MCV: 84.7 fL (ref 80.0–100.0)
Monocytes Absolute: 0.8 10*3/uL (ref 0.1–1.0)
Monocytes Relative: 7 %
Neutro Abs: 7.5 10*3/uL (ref 1.7–7.7)
Neutrophils Relative %: 61 %
Platelets: 414 10*3/uL — ABNORMAL HIGH (ref 150–400)
RBC: 5.49 MIL/uL (ref 4.22–5.81)
RDW: 14.5 % (ref 11.5–15.5)
WBC: 12.3 10*3/uL — ABNORMAL HIGH (ref 4.0–10.5)
nRBC: 0 % (ref 0.0–0.2)

## 2019-03-23 LAB — SEDIMENTATION RATE: Sed Rate: 32 mm/hr — ABNORMAL HIGH (ref 0–16)

## 2019-03-23 LAB — LACTATE DEHYDROGENASE: LDH: 168 U/L (ref 98–192)

## 2019-03-30 ENCOUNTER — Inpatient Hospital Stay (HOSPITAL_BASED_OUTPATIENT_CLINIC_OR_DEPARTMENT_OTHER): Payer: Medicaid Other | Admitting: Hematology

## 2019-03-30 ENCOUNTER — Other Ambulatory Visit: Payer: Self-pay

## 2019-03-30 ENCOUNTER — Encounter (HOSPITAL_COMMUNITY): Payer: Self-pay | Admitting: Hematology

## 2019-03-30 VITALS — BP 145/75 | HR 98 | Temp 97.1°F | Resp 22 | Wt >= 6400 oz

## 2019-03-30 DIAGNOSIS — Z0000 Encounter for general adult medical examination without abnormal findings: Secondary | ICD-10-CM | POA: Diagnosis not present

## 2019-03-30 DIAGNOSIS — D72829 Elevated white blood cell count, unspecified: Secondary | ICD-10-CM | POA: Diagnosis not present

## 2019-03-30 DIAGNOSIS — D696 Thrombocytopenia, unspecified: Secondary | ICD-10-CM

## 2019-03-30 MED ORDER — INFLUENZA VAC SPLIT QUAD 0.5 ML IM SUSY
0.5000 mL | PREFILLED_SYRINGE | Freq: Once | INTRAMUSCULAR | Status: AC
  Administered 2019-03-30: 0.5 mL via INTRAMUSCULAR
  Filled 2019-03-30: qty 0.5

## 2019-03-30 NOTE — Assessment & Plan Note (Signed)
1.  Neutrophilic leukocytosis: -Previously tested for BCR/ABL and Jak 2 V6 80F mutation and was negative. -He is a non-smoker. -Denies any skin infections, dental infections.  Denies any steroid use.  Denies any history of splenectomy. -Denies any fevers, night sweats or weight loss. -We reviewed labs from 03/23/2019 which showed white count of 12.3.  Hemoglobin was normal at 14.3. -Ultrasound of the abdomen on 03/24/2017 showed normal-sized spleen.  Hepatic steatosis. -No further testing required at this time.  We will check him back in 1 year.  If there is any significant worsening of the white count, will consider doing a bone marrow aspiration and biopsy.  2.  Thrombocytosis: -He has history of elevated platelet count for many years. -It is only mildly elevated.  Will consider CALR and MPL testing if there is significant elevation.

## 2019-03-30 NOTE — Progress Notes (Signed)
[place], [place] [id number]   CLINIC:  Medical Oncology/Hematology  PCP:  Rosita Fire, MD [place] [address] [phone]   REASON FOR VISIT:  Follow-up for neutrophilic leukocytosis and thrombocytosis.  CURRENT THERAPY: Observation.   INTERVAL HISTORY:  Mr. Monte 23 y.o. male seen for follow-up of neutrophilic leukocytosis and thrombocytosis.  Denies any history of smoking.  Appetite is 100%.  Energy levels are 25%.  No pain reported.  Reports some sleep problems.  Denies any recurrent infections involving the skin or teeth.  Denies any fevers, night sweats or weight loss in the last 6 months.  No hospitalizations reported.    REVIEW OF SYSTEMS:  Review of Systems  Psychiatric/Behavioral: Positive for sleep disturbance.  All other systems reviewed and are negative.    PAST MEDICAL/SURGICAL HISTORY:  Past Medical History:  Diagnosis Date  . ADHD (attention deficit hyperactivity disorder)   . Anxiety   . Autism disorder   . 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 Diabetes mellitus   . Hypertension   . Prediabetes    Past Surgical History:  Procedure Laterality Date  . CIRCUMCISION    . CIRCUMCISION REVISION    . FRENULECTOMY, LINGUAL    . lipoma    . ORCHIOPEXY       SOCIAL HISTORY:  Social History   Socioeconomic History  . Marital status: Single    Spouse name: Not on file  . Number of children: Not on file  . Years of education: Not on file  . Highest education level: Not on file  Occupational History  . Not on file  Social Needs  . Financial resource strain: Not on file  . Food insecurity    Worry: Not on file    Inability: Not on file  . Transportation needs    Medical: Not on file    Non-medical: Not on file  Tobacco Use  . Smoking status: Former Smoker    Packs/day: 0.25    Quit date: 01/09/2014    Years since quitting: 5.2  . Smokeless tobacco:  Never Used  Substance and Sexual Activity  . Alcohol use: No  . Drug use: No  . Sexual activity: Not on file  Lifestyle  . Physical activity    Days per week: Not on file    Minutes per session: Not on file  . Stress: Not on file  Relationships  . Social Herbalist on phone: Not on file    Gets together: Not on file    Attends religious service: Not on file    Active member of club or organization: Not on file    Attends meetings of clubs or organizations: Not on file    Relationship status: Not on file  . Intimate partner violence    Fear of current or ex partner: Not on file    Emotionally abused: Not on file    Physically abused: Not on file    Forced sexual activity: Not on file  Other Topics Concern  . Not on file  Social History Narrative   Lives with mom, sister, 2 nieces, grandparents and sister's fiance.     FAMILY HISTORY:  Family History  Problem Relation Age of Onset  . Obesity Mother   . Diabetes Mother   . Hypertension Mother   . Hyperlipidemia Mother   . Obesity Sister   . Obesity Maternal Grandmother   .  Diabetes Maternal Grandmother   . Cancer Maternal Grandmother   . Stroke Maternal Grandmother   . Obesity Maternal Grandfather     CURRENT MEDICATIONS:  Outpatient Encounter Medications as of 03/30/2019  Medication Sig  . atomoxetine (STRATTERA) 40 MG capsule Take 80 mg by mouth daily.  . cetirizine (ZYRTEC) 10 MG tablet TAKE ONE TABLET BY MOUTH DAILY.  . chlorthalidone (HYGROTON) 25 MG tablet Take 1 tablet (25 mg total) by mouth daily.  . cloNIDine (CATAPRES) 0.1 MG tablet Take 1 tablet (0.1 mg total) by mouth 2 (two) times daily.  . enalapril (VASOTEC) 20 MG tablet TAKE (1) TABLET BY MOUTH ONCE DAILY.  Marland Kitchen. escitalopram (LEXAPRO) 10 MG tablet Take 10 mg by mouth daily.  Marland Kitchen. guanFACINE (INTUNIV) 2 MG TB24 Take 2 mg by mouth daily.   Marland Kitchen. letrozole (FEMARA) 2.5 MG tablet Take 1 tablet (2.5 mg total) by mouth daily.  . metFORMIN (GLUCOPHAGE) 500  MG tablet Take 1 tablet (500 mg total) by mouth 2 (two) times daily with a meal.  . Metoprolol Tartrate 75 MG TABS Take 75 mg by mouth 2 (two) times daily.  Marland Kitchen. omeprazole (PRILOSEC) 40 MG capsule Take 1 capsule (40 mg total) by mouth 2 (two) times daily.  Marland Kitchen. oxybutynin (DITROPAN-XL) 10 MG 24 hr tablet TAKE 1 TABLET BY MOUTH DAILY.  Marland Kitchen. sertraline (ZOLOFT) 25 MG tablet Take 100 mg by mouth daily. Reported on 06/16/2015  . SYRINGE-NEEDLE, DISP, 3 ML (BD ECLIPSE SYRINGE) 21G X 1" 3 ML MISC by Does not apply route.  Marland Kitchen. testosterone cypionate (DEPOTESTOSTERONE CYPIONATE) 200 MG/ML injection Inject 1 mL (200 mg total) into the muscle every 14 (fourteen) days.  . traZODone (DESYREL) 100 MG tablet Take 50 mg by mouth at bedtime. Reported on 06/16/2015  . [EXPIRED] influenza vac split quadrivalent PF (FLUARIX) injection 0.5 mL    No facility-administered encounter medications on file as of 03/30/2019.     ALLERGIES:  No Known Allergies   PHYSICAL EXAM:  ECOG Performance status: 1  Vitals:   03/30/19 0815  BP: (!) 145/75  Pulse: 98  Resp: (!) 22  Temp: (!) 97.1 F (36.2 C)  SpO2: 95%   Filed Weights   03/30/19 0815  Weight: (!) 424 lb (192.3 kg)    Physical Exam Vitals signs reviewed.  Constitutional:      Appearance: Normal appearance. He is obese.  Cardiovascular:     Rate and Rhythm: Normal rate and regular rhythm.     Heart sounds: Normal heart sounds.  Pulmonary:     Effort: Pulmonary effort is normal.     Breath sounds: Normal breath sounds.  Abdominal:     General: There is no distension.     Palpations: Abdomen is soft. There is no mass.  Musculoskeletal:        General: No swelling.  Lymphadenopathy:     Cervical: No cervical adenopathy.  Skin:    General: Skin is warm.  Neurological:     General: No focal deficit present.     Mental Status: He is alert and oriented to person, place, and time.  Psychiatric:        Mood and Affect: Mood normal.        Behavior:  Behavior normal.      LABORATORY DATA:  I have reviewed the labs as listed.  CBC    Component Value Date/Time   WBC 12.3 (H) 03/23/2019 1048   RBC 5.49 03/23/2019 1048   HGB 14.3 03/23/2019 1048  HCT 46.5 03/23/2019 1048   PLT 414 (H) 03/23/2019 1048   MCV 84.7 03/23/2019 1048   MCH 26.0 03/23/2019 1048   MCHC 30.8 03/23/2019 1048   RDW 14.5 03/23/2019 1048   LYMPHSABS 3.7 03/23/2019 1048   MONOABS 0.8 03/23/2019 1048   EOSABS 0.3 03/23/2019 1048   BASOSABS 0.0 03/23/2019 1048   CMP Latest Ref Rng & Units 03/23/2019 08/21/2018 03/24/2018  Glucose 70 - 99 mg/dL 160(F) 093 235(T)  BUN 6 - 20 mg/dL 8 15 9   Creatinine 0.61 - 1.24 mg/dL 7.32 2.02  Sodium 135 - 145 mmol/L 139 140 137  Potassium 3.5 - 5.1 mmol/L 3.2(L) 4.1 3.4(L)  Chloride 98 - 111 mmol/L 99 102 105  CO2 22 - 32 mmol/L 28 25 23   Calcium 8.9 - 10.3 mg/dL 5.42) 9.5 )  Total Protein 6.5 - 8.1 g/dL 7.6 7.3 7.6  Total Bilirubin 0.3 - 1.2 mg/dL 0.4 0.3 0.6  Alkaline Phos 38 - 126 U/L 79 - 86  AST 15 - 41 U/L 24 22 24   ALT 0 - 44 U/L 27 28 28        DIAGNOSTIC IMAGING:  I have independently reviewed the scans and discussed with the patient.     ASSESSMENT & PLAN:   Leukocytosis 1.  Neutrophilic leukocytosis: -Previously tested for BCR/ABL and Jak 2 V6 69F mutation and was negative. -He is a non-smoker. -Denies any skin infections, dental infections.  Denies any steroid use.  Denies any history of splenectomy. -Denies any fevers, night sweats or weight loss. -We reviewed labs from 03/23/2019 which showed white count of 12.3.  Hemoglobin was normal at 14.3. -Ultrasound of the abdomen on 03/24/2017 showed normal-sized spleen.  Hepatic steatosis. -No further testing required at this time.  We will check him back in 1 year.  If there is any significant worsening of the white count, will consider doing a bone marrow aspiration and biopsy.  2.  Thrombocytosis: -He has history of elevated platelet  count for many years. -It is only mildly elevated.  Will consider CALR and MPL testing if there is significant elevation.   Total time spent is 25 minutes with more than 50% of the time spent face-to-face discussing disease state, lab work, counseling and coordination of care.  Orders placed this encounter:  Orders Placed This Encounter  Procedures  . Lactate dehydrogenase  . CBC with Differential  . Reticulocytes  . Comprehensive metabolic panel      , [facility] [phone]

## 2019-05-12 ENCOUNTER — Ambulatory Visit: Payer: Medicaid Other | Admitting: "Endocrinology

## 2019-05-18 LAB — LIPID PANEL
Cholesterol: 227 mg/dL — ABNORMAL HIGH (ref <200)
HDL: 51 mg/dL (ref >=40)
LDL Cholesterol (Calc): 145 mg/dL (calc) — ABNORMAL HIGH
Non-HDL Cholesterol (Calc): 176 mg/dL (calc) — ABNORMAL HIGH (ref <130)
Total CHOL/HDL Ratio: 4.5 (calc) (ref <5.0)
Triglycerides: 169 mg/dL — ABNORMAL HIGH (ref <150)

## 2019-05-18 LAB — TESTOS,TOTAL,FREE AND SHBG (FEMALE)
Free Testosterone: 38.8 pg/mL (ref 35.0–155.0)
Sex Hormone Binding: 17 nmol/L (ref 10–50)
Testosterone, Total, LC-MS-MS: 174 ng/dL — ABNORMAL LOW (ref 250–1100)

## 2019-05-21 ENCOUNTER — Ambulatory Visit (INDEPENDENT_AMBULATORY_CARE_PROVIDER_SITE_OTHER): Payer: Medicaid Other | Admitting: "Endocrinology

## 2019-05-21 ENCOUNTER — Encounter: Payer: Self-pay | Admitting: "Endocrinology

## 2019-05-21 DIAGNOSIS — E291 Testicular hypofunction: Secondary | ICD-10-CM

## 2019-05-21 DIAGNOSIS — E119 Type 2 diabetes mellitus without complications: Secondary | ICD-10-CM

## 2019-05-21 DIAGNOSIS — E782 Mixed hyperlipidemia: Secondary | ICD-10-CM | POA: Diagnosis not present

## 2019-05-21 MED ORDER — ROSUVASTATIN CALCIUM 5 MG PO TABS: 5.0000 mg | ORAL_TABLET | Freq: Every day | ORAL | 3 refills | Status: DC

## 2019-05-21 NOTE — Progress Notes (Signed)
05/21/2019                                Endocrinology Telehealth Visit Follow up Note -During COVID -19 Pandemic   I connected with Allen Harding on 05/21/2019   by telephone and verified that I am speaking with the correct person using two identifiers. Allen Harding, [birth year]. he has verbally consented to this visit. All issues noted in this document were discussed and addressed. The format was not optimal for physical exam.   Subjective:    Patient ID: Allen Harding, male    DOB: 03-19-1996, PCP Rosita Fire, MD   Past Medical History:  Diagnosis Date  . ADHD (attention deficit hyperactivity disorder)   . Anxiety   . Autism disorder   . 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 Diabetes mellitus   . Hypertension   . Prediabetes    Past Surgical History:  Procedure Laterality Date  . CIRCUMCISION    . CIRCUMCISION REVISION    . FRENULECTOMY, LINGUAL    . lipoma    . ORCHIOPEXY     Social History   Socioeconomic History  . Marital status: Single    Spouse name: Not on file  . Number of children: Not on file  . Years of education: Not on file  . Highest education level: Not on file  Occupational History  . Not on file  Tobacco Use  . Smoking status: Former Smoker    Packs/day: 0.25    Quit date: 01/09/2014    Years since quitting: 5.3  . Smokeless tobacco: Never Used  Substance and Sexual Activity  . Alcohol use: No  . Drug use: No  . Sexual activity: Not on file  Other Topics Concern  . Not on file  Social History Narrative   Lives with mom, sister, 2 nieces, grandparents and sister's fiance.    Social Determinants of Health   Financial Resource Strain:   . Difficulty of Paying Living Expenses: Not on file  Food Insecurity:   . Worried About Charity fundraiser in the Last Year: Not on file  . Ran Out of Food in the Last Year: Not on file  Transportation Needs:   . Lack of Transportation (Medical): Not on file  . Lack of  Transportation (Non-Medical): Not on file  Physical Activity:   . Days of Exercise per Week: Not on file  . Minutes of Exercise per Session: Not on file  Stress:   . Feeling of Stress : Not on file  Social Connections:   . Frequency of Communication with Friends and Family: Not on file  . Frequency of Social Gatherings with Friends and Family: Not on file  . Attends Religious Services: Not on file  . Active Member of Clubs or Organizations: Not on file  . Attends Archivist Meetings: Not on file  . Marital Status: Not on file   Outpatient Encounter Medications as of 05/21/2019  Medication Sig  . atomoxetine (STRATTERA) 40 MG capsule Take 80 mg by mouth daily.  . cetirizine (ZYRTEC) 10 MG tablet TAKE ONE TABLET BY MOUTH DAILY.  . chlorthalidone (HYGROTON) 25 MG tablet Take 1 tablet (25 mg total) by mouth daily.  . cloNIDine (CATAPRES) 0.1 MG tablet Take 1 tablet (0.1 mg total) by mouth 2 (two) times daily.  . enalapril (VASOTEC) 20 MG tablet TAKE (1) TABLET BY  MOUTH ONCE DAILY.  Marland Kitchen escitalopram (LEXAPRO) 10 MG tablet Take 10 mg by mouth daily.  Marland Kitchen guanFACINE (INTUNIV) 2 MG TB24 Take 2 mg by mouth daily.   Marland Kitchen letrozole (FEMARA) 2.5 MG tablet Take 1 tablet (2.5 mg total) by mouth daily.  . metFORMIN (GLUCOPHAGE) 500 MG tablet Take 1 tablet (500 mg total) by mouth 2 (two) times daily with a meal.  . Metoprolol Tartrate 75 MG TABS Take 75 mg by mouth 2 (two) times daily.  Marland Kitchen omeprazole (PRILOSEC) 40 MG capsule Take 1 capsule (40 mg total) by mouth 2 (two) times daily.  Marland Kitchen oxybutynin (DITROPAN-XL) 10 MG 24 hr tablet TAKE 1 TABLET BY MOUTH DAILY.  . rosuvastatin (CRESTOR) 5 MG tablet Take 1 tablet (5 mg total) by mouth daily.  . sertraline (ZOLOFT) 25 MG tablet Take 100 mg by mouth daily. Reported on 06/16/2015  . SYRINGE-NEEDLE, DISP, 3 ML (BD ECLIPSE SYRINGE) 21G X 1" 3 ML MISC by Does not apply route.  Marland Kitchen testosterone cypionate (DEPOTESTOSTERONE CYPIONATE) 200 MG/ML injection Inject 1  mL (200 mg total) into the muscle every 14 (fourteen) days.  . traZODone (DESYREL) 100 MG tablet Take 50 mg by mouth at bedtime. Reported on 06/16/2015   No facility-administered encounter medications on file as of 05/21/2019.   ALLERGIES: No Known Allergies VACCINATION STATUS: Immunization History  Administered Date(s) Administered  . DTaP 06/11/1995, 08/06/1995, 09/24/1995, 06/30/1996, 09/24/1999  . H1N1 11/24/2008  . HPV Quadrivalent 02/08/2014  . Hepatitis A, Ped/Adol-2 Dose 02/03/2013, 08/19/2013  . Hepatitis B February [birth year], 1995, 05/07/1995, 09/24/1995  . HiB (PRP-OMP) 06/11/1995, 08/06/1995, 09/24/1995, 06/30/1996  . IPV 06/11/1995, 08/06/1995, 09/24/1995, 09/24/1999  . Influenza Whole 03/22/2009, 06/17/2011, 07/23/2012  . Influenza,inj,Quad PF,6+ Mos 05/02/2015, 03/30/2019  . MMR 03/31/1996, 09/24/1999  . Meningococcal Conjugate 11/24/2008, 08/19/2013  . Td 01/07/2006  . Tdap 01/07/2006  . Varicella 03/31/1996, 06/01/1997    Diabetes   23 year old male patient with medical history as above. 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  - He has been following at [facility] until March 2017 with Dr. Baldo Ash. -He does not know for sure when he was started on testosterone therapy currently at 100 mg IM every  week.  He reports better consistency of his Injection at this time.   His previsit labs show below  target total testosterone is improving to 174 from  132. -He has dealt with heavy weight almost all of his life, admits to dietary indiscretions including consumption of large quantities of sweetened beverages.  His previsit labs show A1c of 5.9% improving from 6.5%, currently on metformin 500 mg p.o. twice daily.   -His previsit labs show significant dyslipidemia. -He has no acute complaints today. -He underwent  orchidopexy for right-sided undescended testes. -He denies testicular injury, radiation, infection, STD. -He denies any history of head injury.    Objective:    There were no vitals taken for this visit.  Wt Readings from Last 3 Encounters:  03/30/19 (!) 424 lb (192.3 kg)  05/25/18 (!) 415 lb (188.2 kg)  03/31/18 (!) 400 lb 9.6 oz (181.7 kg)      CMP     Component Value Date/Time   NA 139 03/23/2019 1048   K 3.2 (L) 03/23/2019 1048   CL 99 03/23/2019 1048   CO2 28 03/23/2019 1048   GLUCOSE 167 (H) 03/23/2019 1048   BUN 8 03/23/2019 1048   CREATININE 0.77  03/23/2019 1048   CREATININE 0.72 08/21/2018 1149   CALCIUM 8.8 (L) 03/23/2019 1048   PROT 7.6 03/23/2019 1048   ALBUMIN 3.5 03/23/2019 1048   AST 24 03/23/2019 1048   ALT 27 03/23/2019 1048   ALKPHOS 79 03/23/2019 1048   BILITOT 0.4 03/23/2019 1048   GFRNONAA >60 03/23/2019 1048   GFRNONAA 131 08/21/2018 1149   GFRAA >60 03/23/2019 1048   GFRAA 152 08/21/2018 1149   Diabetic Labs (most recent): Lab Results  Component Value Date   HGBA1C 5.9 (H) 01/04/2019   HGBA1C 6.5 (H) 08/21/2018   HGBA1C 6.0 (H) 01/15/2018    Lipid Panel     Component Value Date/Time   CHOL 227 (H) 05/13/2019 1349   TRIG 169 (H) 05/13/2019 1349   HDL 51 05/13/2019 1349   CHOLHDL 4.5 05/13/2019 1349   VLDL 21 08/08/2015 1152   LDLCALC 145 (H) 05/13/2019 1349   Recent Results (from the past 2160 hour(s))  CBC with Differential     Status: Abnormal   Collection Time: 03/23/19 10:48 AM  Result Value Ref Range   WBC 12.3 (H) 4.0 - 10.5 K/uL   RBC 5.49 4.22 - 5.81 MIL/uL   Hemoglobin 14.3 13.0 - 17.0 g/dL   HCT 46.5 39.0 - 52.0 %   MCV 84.7 80.0 - 100.0 fL   MCH 26.0 26.0 - 34.0 pg   MCHC 30.8 30.0 - 36.0 g/dL   RDW 14.5 11.5 - 15.5 %   Platelets 414 (H) 150 - 400 K/uL   nRBC 0.0 0.0 - 0.2 %   Neutrophils Relative % 61 %   Neutro Abs 7.5 1.7 - 7.7 K/uL   Lymphocytes Relative 30 %   Lymphs Abs 3.7 0.7 - 4.0 K/uL   Monocytes  Relative 7 %   Monocytes Absolute 0.8 0.1 - 1.0 K/uL   Eosinophils Relative 2 %   Eosinophils Absolute 0.3 0.0 - 0.5 K/uL   Basophils Relative 0 %   Basophils Absolute 0.0 0.0 - 0.1 K/uL   Immature Granulocytes 0 %   Abs Immature Granulocytes 0.05 0.00 - 0.07 K/uL    Comment: Performed at [facility], [address] [place], [place] [address]  Comprehensive metabolic panel     Status: Abnormal   Collection Time: 03/23/19 10:48 AM  Result Value Ref Range   Sodium 139 135 - 145 mmol/L   Potassium 3.2 (L) 3.5 - 5.1 mmol/L   Chloride 99 98 - 111 mmol/L   CO2 28 22 - 32 mmol/L   Glucose, Bld 167 (H) 70 - 99 mg/dL   BUN 8 6 - 20 mg/dL   Creatinine, Ser 0.77 0.61 - 1.24 mg/dL   Calcium 8.8 (L) 8.9 - 10.3 mg/dL   Total Protein 7.6 6.5 - 8.1 g/dL   Albumin 3.5 3.5 - 5.0 g/dL   AST 24 15 - 41 U/L   ALT 27 0 - 44 U/L   Alkaline Phosphatase 79 38 - 126 U/L   Total Bilirubin 0.4 0.3 - 1.2 mg/dL   GFR calc non Af Amer >60 >60 mL/min   GFR calc Af Amer >60 >60 mL/min   Anion gap 12 5 - 15    Comment: Performed at [facility], [address], [place], [place] [address]  Lactate dehydrogenase     Status: None   Collection Time: 03/23/19 10:48 AM  Result Value Ref Range   LDH 168 98 - 192 U/L    Comment: Performed at [facility], [address], [place], [place] [address]  Sedimentation rate     Status: Abnormal   Collection Time: 03/23/19 10:48 AM  Result Value Ref Range   Sed Rate 32 (H) 0 - 16 mm/hr    Comment: Performed at [facility], [address], [place], [place] [address]  C-reactive protein     Status: Abnormal   Collection Time: 03/23/19 10:48 AM  Result Value Ref Range   CRP 4.3 (H) <1.0 mg/dL    Comment: Performed at [facility], [address] [place], [place] [address]  Lipid panel     Status: Abnormal   Collection Time: 05/13/19  1:49 PM  Result Value Ref Range   Cholesterol 227 (H) <200 mg/dL   HDL 51 > OR = 40 mg/dL   Triglycerides 169 (H) <150 mg/dL   LDL  Cholesterol (Calc) 145 (H) mg/dL (calc)    Comment: Reference range: <100 . Desirable range <100 mg/dL for primary prevention;   <70 mg/dL for patients with CHD or diabetic patients  with > or = 2 CHD risk factors. Marland Kitchen LDL-C is now calculated using the Martin-Hopkins  calculation, which is a validated novel method providing  better accuracy than the Friedewald equation in the  estimation of LDL-C.  Cresenciano Genre et al. Annamaria Helling. 5974;163(84): 2061-2068  (http://education.[facility].com/faq/FAQ164)    Total CHOL/HDL Ratio 4.5 <5.0 (calc)   Non-HDL Cholesterol (Calc) 176 (H) <130 mg/dL (calc)    Comment: For patients with diabetes plus 1 major ASCVD risk  factor, treating to a non-HDL-C goal of <100 mg/dL  (LDL-C of <70 mg/dL) is considered a therapeutic  option.   Testos,Total,Free and SHBG (Male)     Status: Abnormal   Collection Time: 05/13/19  1:49 PM  Result Value Ref Range   Testosterone, Total, LC-MS-MS 174 (L) 250 - 1,100 ng/dL    Comment: . For additional information, please refer to http://education.[facility].com/faq/ TotalTestosteroneLCMSMSFAQ165 (This link is being provided for informational/ educational purposes only.) . This test was developed and its analytical performance characteristics have been determined by [place], [place]. It has not been cleared or approved by the U.S. Food and Drug Administration. This assay has been validated pursuant to the CLIA regulations and is used for clinical purposes. .    Free Testosterone 38.8 35.0 - 155.0 pg/mL    Comment: . This test was developed and its analytical performance characteristics have been determined by [place], [place]. It has not been cleared or approved by the U.S. Food and Drug Administration. This assay has been validated pursuant to the CLIA regulations and is used for clinical purposes. .    Sex Hormone Binding 17 10 - 50  nmol/L       Assessment & Plan:   1.  Type 2 diabetes -new diagnosis  -His recent labs showed A1c of 5.9%.  He is responding and benefiting from metformin therapy.  He is advised to continue metformin 500 mg p.o. twice daily daily after breakfast and supper.    - he  admits there is a room for improvement in his diet and drink choices. -  Suggestion is made for him to avoid simple carbohydrates  from his diet including Cakes, Sweet Desserts / Pastries, Ice Cream, Soda (diet and regular), Sweet Tea, Candies, Chips, Cookies, Sweet Pastries,  Store Bought Juices, Alcohol in Excess of  1-2 drinks a day, Artificial Sweeteners, Coffee Creamer, and "Sugar-free" Products. This will help patient to have stable blood glucose profile and  potentially avoid unintended weight gain.  2. Hypogonadism  - Etiology most likely multifactorial including chromosomal abnormality and history of undescended testes.  - He has required testosterone supplement for several years now. I have reviewed his EMR records and found out that he had low testosterone starting from at least 2012.  -I have not seen FSH/LH levels, would have  been useful prior to initiation of testosterone therapy however the utility of that test now is unremarkable. 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 His previsit labs show testosterone level of 174 improving from 132.  -He wishes to continue to receive testosterone supplement.   He is advised to continue testosterone injection 100 mg IM every 7 days.    Therapeutic goal is to keep his total testosterone above 250.  -He will need repeat testosterone measurement along with CBC during his next visit.   3. Undescended right testicle - He is status post orchidopexy of right testicle. The details of his surgical history are not available for review.  4) hypertension he is advised to home monitor blood pressure and report if > 140/90 on 2 separate readings. -He is advised to continue  his current blood pressure medications including enalapril 20 mg p.o. daily, clonidine 0.5 mg p.o. twice daily, chlorthalidone 25 mg daily.  5) hyperlipidemia   -He has longstanding, persistent dyslipidemia with significantly above target LDL.  I discussed his labs  with him.  He will benefit from early initiation of statin therapy.  I discussed the started Crestor 5 mg p.o. nightly.  Side effects and precautions discussed with him.  He is advised to continue close follow-up with his PMD Dr. Legrand Rams.  - Patient Care Time Today:  25 min, of which >50% was spent in  counseling and the rest reviewing his  current and  previous labs/studies, previous treatments,and medications' doses and developing a plan for long-term care based on the latest recommendations for standards of care.   Allen Harding participated in the discussions, expressed understanding, and voiced agreement with the above plans.  All questions were answered to his satisfaction. he is encouraged to contact clinic should he have any questions or concerns prior to his return visit.    Follow up plan: Return in about 4 months (around 09/19/2019) for Follow up with Pre-visit Labs, Next Visit A1c in Office.  Glade Lloyd, MD Phone: [phone]  Fax: [phone]   This note was partially dictated with voice recognition software. Similar sounding words can be transcribed inadequately or may not  be corrected upon review.  05/21/2019, 10:39 AM

## 2019-05-21 NOTE — Patient Instructions (Signed)

## 2019-09-17 LAB — TESTOSTERONE TOTAL,FREE,BIO, MALES
Albumin: 3.8 g/dL (ref 3.6–5.1)
Sex Hormone Binding: 17 nmol/L (ref 10–50)
Testosterone: 32 ng/dL — ABNORMAL LOW (ref 250–827)

## 2019-09-17 LAB — VITAMIN D 25 HYDROXY (VIT D DEFICIENCY, FRACTURES): Vit D, 25-Hydroxy: 17 ng/mL — ABNORMAL LOW (ref 30–100)

## 2019-09-23 ENCOUNTER — Encounter: Payer: Self-pay | Admitting: "Endocrinology

## 2019-09-23 ENCOUNTER — Ambulatory Visit (INDEPENDENT_AMBULATORY_CARE_PROVIDER_SITE_OTHER): Payer: Medicaid Other | Admitting: "Endocrinology

## 2019-09-23 ENCOUNTER — Other Ambulatory Visit: Payer: Self-pay

## 2019-09-23 VITALS — BP 152/92 | HR 84 | Ht 71.0 in | Wt >= 6400 oz

## 2019-09-23 DIAGNOSIS — I10 Essential (primary) hypertension: Secondary | ICD-10-CM

## 2019-09-23 DIAGNOSIS — E559 Vitamin D deficiency, unspecified: Secondary | ICD-10-CM

## 2019-09-23 DIAGNOSIS — E782 Mixed hyperlipidemia: Secondary | ICD-10-CM | POA: Diagnosis not present

## 2019-09-23 DIAGNOSIS — E119 Type 2 diabetes mellitus without complications: Secondary | ICD-10-CM

## 2019-09-23 DIAGNOSIS — E291 Testicular hypofunction: Secondary | ICD-10-CM | POA: Diagnosis not present

## 2019-09-23 LAB — POCT GLYCOSYLATED HEMOGLOBIN (HGB A1C): Hemoglobin A1C: 6.3 % — AB (ref 4.0–5.6)

## 2019-09-23 MED ORDER — VITAMIN D (ERGOCALCIFEROL) 1.25 MG (50000 UNIT) PO CAPS: 50000.0000 [IU] | ORAL_CAPSULE | ORAL | 0 refills | Status: DC

## 2019-09-23 MED ORDER — TESTOSTERONE CYPIONATE 200 MG/ML IM SOLN: 100.0000 mg | INTRAMUSCULAR | 0 refills | Status: DC

## 2019-09-23 MED ORDER — "BD ECLIPSE SYRINGE 21G X 1"" 3 ML MISC": 1 refills | Status: DC

## 2019-09-23 NOTE — Patient Instructions (Signed)

## 2019-09-23 NOTE — Progress Notes (Signed)
09/23/2019      Endocrinology follow-up note   Subjective:    Patient ID: Allen Harding, male    DOB: 05-30-1996, PCP Rosita Fire, MD   Past Medical History:  Diagnosis Date  . ADHD (attention deficit hyperactivity disorder)   . Anxiety   . Autism disorder   . 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 Diabetes mellitus   . Hypertension   . Prediabetes    Past Surgical History:  Procedure Laterality Date  . CIRCUMCISION    . CIRCUMCISION REVISION    . FRENULECTOMY, LINGUAL    . lipoma    . ORCHIOPEXY     Social History   Socioeconomic History  . Marital status: Single    Spouse name: Not on file  . Number of children: Not on file  . Years of education: Not on file  . Highest education level: Not on file  Occupational History  . Not on file  Tobacco Use  . Smoking status: Former Smoker    Packs/day: 0.25    Quit date: 01/09/2014    Years since quitting: 5.7  . Smokeless tobacco: Never Used  Substance and Sexual Activity  . Alcohol use: No  . Drug use: No  . Sexual activity: Not on file  Other Topics Concern  . Not on file  Social History Narrative   Lives with mom, sister, 2 nieces, grandparents and sister's fiance.    Social Determinants of Health   Financial Resource Strain:   . Difficulty of Paying Living Expenses:   Food Insecurity:   . Worried About Charity fundraiser in the Last Year:   . Arboriculturist in the Last Year:   Transportation Needs:   . Film/video editor (Medical):   Marland Kitchen Lack of Transportation (Non-Medical):   Physical Activity:   . Days of Exercise per Week:   . Minutes of Exercise per Session:   Stress:   . Feeling of Stress :   Social Connections:   . Frequency of Communication with Friends and Family:   . Frequency of Social Gatherings with Friends and Family:   . Attends Religious Services:   . Active Member of Clubs or Organizations:   . Attends Archivist Meetings:   Marland Kitchen Marital  Status:    Outpatient Encounter Medications as of 09/23/2019  Medication Sig  . atomoxetine (STRATTERA) 40 MG capsule Take 80 mg by mouth daily.  . cetirizine (ZYRTEC) 10 MG tablet TAKE ONE TABLET BY MOUTH DAILY.  . chlorthalidone (HYGROTON) 25 MG tablet Take 1 tablet (25 mg total) by mouth daily.  . cloNIDine (CATAPRES) 0.1 MG tablet Take 1 tablet (0.1 mg total) by mouth 2 (two) times daily.  . enalapril (VASOTEC) 20 MG tablet TAKE (1) TABLET BY MOUTH ONCE DAILY.  Marland Kitchen escitalopram (LEXAPRO) 10 MG tablet Take 10 mg by mouth daily.  Marland Kitchen guanFACINE (INTUNIV) 2 MG TB24 Take 2 mg by mouth daily.   Marland Kitchen letrozole (FEMARA) 2.5 MG tablet Take 1 tablet (2.5 mg total) by mouth daily.  . metFORMIN (GLUCOPHAGE) 500 MG tablet Take 1 tablet (500 mg total) by mouth 2 (two) times daily with a meal.  . Metoprolol Tartrate 75 MG TABS Take 75 mg by mouth 2 (two) times daily.  Marland Kitchen omeprazole (PRILOSEC) 40 MG capsule Take 1 capsule (40 mg total) by mouth 2 (two) times daily.  Marland Kitchen oxybutynin (DITROPAN-XL) 10 MG 24 hr tablet TAKE 1 TABLET BY MOUTH  DAILY.  . rosuvastatin (CRESTOR) 5 MG tablet Take 1 tablet (5 mg total) by mouth daily.  . sertraline (ZOLOFT) 25 MG tablet Take 100 mg by mouth daily. Reported on 06/16/2015  . SYRINGE-NEEDLE, DISP, 3 ML (BD ECLIPSE SYRINGE) 21G X 1" 3 ML MISC USE TO INJECT TESTOSTERONE WEEKLY  . testosterone cypionate (DEPOTESTOSTERONE CYPIONATE) 200 MG/ML injection Inject 0.5 mLs (100 mg total) into the muscle every 7 (seven) days.  . traZODone (DESYREL) 100 MG tablet Take 50 mg by mouth at bedtime. Reported on 06/16/2015  . Vitamin D, Ergocalciferol, (DRISDOL) 1.25 MG (50000 UNIT) CAPS capsule Take 1 capsule (50,000 Units total) by mouth every 7 (seven) days.  . [DISCONTINUED] SYRINGE-NEEDLE, DISP, 3 ML (BD ECLIPSE SYRINGE) 21G X 1" 3 ML MISC by Does not apply route.  . [DISCONTINUED] testosterone cypionate (DEPOTESTOSTERONE CYPIONATE) 200 MG/ML injection Inject 1 mL (200 mg total) into the muscle  every 14 (fourteen) days.   No facility-administered encounter medications on file as of 09/23/2019.   ALLERGIES: No Known Allergies VACCINATION STATUS: Immunization History  Administered Date(s) Administered  . DTaP 06/11/1995, 08/06/1995, 09/24/1995, 06/30/1996, 09/24/1999  . H1N1 11/24/2008  . HPV Quadrivalent 02/08/2014  . Hepatitis A, Ped/Adol-2 Dose 02/03/2013, 08/19/2013  . Hepatitis B July 26, 1994, 05/07/1995, 09/24/1995  . HiB (PRP-OMP) 06/11/1995, 08/06/1995, 09/24/1995, 06/30/1996  . IPV 06/11/1995, 08/06/1995, 09/24/1995, 09/24/1999  . Influenza Whole 03/22/2009, 06/17/2011, 07/23/2012  . Influenza,inj,Quad PF,6+ Mos 05/02/2015, 03/30/2019  . MMR 03/31/1996, 09/24/1999  . Meningococcal Conjugate 11/24/2008, 08/19/2013  . Td 01/07/2006  . Tdap 01/07/2006  . Varicella 03/31/1996, 06/01/1997    Diabetes   24 year old male patient with medical history as above. 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  - He has been following at [facility] until March 2017 with Dr. Baldo Ash. -He does not know for sure when he was started on testosterone therapy currently at 100 mg IM every  week.  More recently, he was not consistent takingthis injection.  His previsit labs show total testosterone of 38.8, dropping from 174..   -He has dealt with heavy weight almost all of his life, admits to dietary indiscretions including consumption of large quantities of sweetened beverages.  He is point-of-care A1c 6.3%, increasing from 5.9%.  -He is currently on metformin 500 mg p.o. twice daily.   -He was recently initiated on Crestor due to significant dyslipidemia.  He has no complaints today.  He continues to gain weight.  -He underwent orchidopexy for right-sided undescended testes. -He denies testicular injury, radiation, infection,  STD. -He denies any history of head injury.    Objective:    BP (!) 152/92   Pulse 84   Ht '5\' 11"'  (1.803 m)   Wt (!) 430 lb 9.6 oz (195.3 kg)   BMI 60.06 kg/m   Wt Readings from Last 3 Encounters:  09/23/19 (!) 430 lb 9.6 oz (195.3 kg)  03/30/19 (!) 424 lb (192.3 kg)  05/25/18 (!) 415 lb (188.2 kg)     Physical Exam- Limited  Constitutional:  Body mass index is 60.06 kg/m. , not in acute distress, normal state of mind Eyes:  EOMI, no exophthalmos Neck: Supple Thyroid: No gross goiter Respiratory: Adequate breathing efforts Musculoskeletal: no gross deformities, strength intact in all four extremities, no gross restriction of joint movements Skin:  no rashes, no hyperemia Neurological: no tremor with outstretched hands,    CMP     Component  Value Date/Time   NA 139 03/23/2019 1048   K 3.2 (L) 03/23/2019 1048   CL 99 03/23/2019 1048   CO2 28 03/23/2019 1048   GLUCOSE 167 (H) 03/23/2019 1048   BUN 8 03/23/2019 1048   CREATININE 0.77 03/23/2019 1048   CREATININE 0.72 08/21/2018 1149   CALCIUM 8.8 (L) 03/23/2019 1048   PROT 7.6 03/23/2019 1048   ALBUMIN 3.5 03/23/2019 1048   AST 24 03/23/2019 1048   ALT 27 03/23/2019 1048   ALKPHOS 79 03/23/2019 1048   BILITOT 0.4 03/23/2019 1048   GFRNONAA >60 03/23/2019 1048   GFRNONAA 131 08/21/2018 1149   GFRAA >60 03/23/2019 1048   GFRAA 152 08/21/2018 1149   Diabetic Labs (most recent): Lab Results  Component Value Date   HGBA1C 6.3 (A) 09/23/2019   HGBA1C 5.9 (H) 01/04/2019   HGBA1C 6.5 (H) 08/21/2018    Lipid Panel     Component Value Date/Time   CHOL 227 (H) 05/13/2019 1349   TRIG 169 (H) 05/13/2019 1349   HDL 51 05/13/2019 1349   CHOLHDL 4.5 05/13/2019 1349   VLDL 21 08/08/2015 1152   LDLCALC 145 (H) 05/13/2019 1349   Recent Results (from the past 2160 hour(s))  Testosterone Total,Free,Bio, Males     Status: Abnormal   Collection Time: 09/16/19  3:36 PM  Result Value Ref Range   Testosterone 32 (L) 250  - 827 ng/dL   Albumin 3.8 3.6 - 5.1 g/dL   Sex Hormone Binding 17 10 - 50 nmol/L   Testosterone, Free See below 46.0 - 224.0 pg/mL   Testosterone, Bioavailable  110.0 - 575 ng/dL    Comment: Due to the diminished accuracy of immunoassay at levels below 250 ng/dL, calculations of the Free and Bioavailable Testosterone are not accurate. If needed, Testosterone, Free, Bio and Total, LC/MS/MS (test code [phone]) is the recommended assay. This specimen  must be collected in a red-top tube with no gel. Marland Kitchen   VITAMIN D 25 Hydroxy (Vit-D Deficiency, Fractures)     Status: Abnormal   Collection Time: 09/16/19  3:36 PM  Result Value Ref Range   Vit D, 25-Hydroxy 17 (L) 30 - 100 ng/mL    Comment: Vitamin D Status         25-OH Vitamin D: . Deficiency:                    <20 ng/mL Insufficiency:             20 - 29 ng/mL Optimal:                 > or = 30 ng/mL . For 25-OH Vitamin D testing on patients on  D2-supplementation and patients for whom quantitation  of D2 and D3 fractions is required, the [employer](TM) 25-OH VIT D, (D2,D3), LC/MS/MS is recommended: order  code [phone] (patients >105yr). See Note 1 . Note 1 . For additional information, please refer to  http://education.[facility].com/faq/FAQ199  (This link is being provided for informational/ educational purposes only.)   HgB A1c     Status: Abnormal   Collection Time: 09/23/19  9:11 AM  Result Value Ref Range   Hemoglobin A1C 6.3 (A) 4.0 - 5.6 %   HbA1c POC (<> result, manual entry)     HbA1c, POC (prediabetic range)     HbA1c, POC (controlled diabetic range)       Assessment & Plan:   1.  Type 2 diabetes -new diagnosis  -His point-of-care A1c 6.3% increasing  from 5.9%.  He has benefited from metformin therapy.  He is advised to continue Metformin 500 mg p.o. twice daily.    -  Suggestion is made for him to avoid simple carbohydrates  from his diet including Cakes, Sweet Desserts / Pastries, Ice Cream, Soda (diet  and regular), Sweet Tea, Candies, Chips, Cookies, Sweet Pastries,  Store Bought Juices, Alcohol in Excess of  1-2 drinks a day, Artificial Sweeteners, Coffee Creamer, and "Sugar-free" Products. This will help patient to have stable blood glucose profile and potentially avoid unintended weight gain.   2. Hypogonadism  - Etiology most likely multifactorial including chromosomal abnormality and history of undescended testes.  - He has required testosterone supplement for several years now. I have reviewed his EMR records and found out that he had low testosterone starting from at least 2012.  -I have not seen FSH/LH levels, would have  been useful prior to initiation of testosterone therapy however the utility of that test now is unremarkable. 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  Due to inconsistency in taking his testosterone, his previsit total testosterone is 38.8 dropping from 174.  He wishes to continue to be treated with testosterone.  I discussed and reinitiated testosterone injection 100 mg IM every 7 days.  Therapeutic goal is to keep his total testosterone above 250.  -He will need repeat testosterone measurement along with CBC during his next visit.   3. Undescended right testicle - He is status post orchidopexy of right testicle. The details of his surgical history are not available for review.  4) hypertension -His blood pressure is not controlled to target. -He is advised to continue his current blood pressure medications including enalapril 20 mg p.o. daily, clonidine 0.5 mg p.o. twice daily, chlorthalidone 25 mg daily.  5) hyperlipidemia   -He has longstanding, persistent dyslipidemia with significantly above target LDL.  He was initiated on Crestor 5 mg p.o. nightly during his last visit.  He reports compliance and consistency.  He will have fasting lipid panel along with his next labs.   He is advised to continue close follow-up with his PMD Dr. Legrand Rams.  - Time  spent on this patient care encounter:  35 min, of which >50% was spent in  counseling and the rest reviewing his  current and  previous labs/studies ( including abstraction from other facilities),  previous treatments, his blood glucose readings, and medications' doses and developing a plan for long-term care based on the latest recommendations for standards of care; and documenting his care.  Allen Harding participated in the discussions, expressed understanding, and voiced agreement with the above plans.  All questions were answered to his satisfaction. he is encouraged to contact clinic should he have any questions or concerns prior to his return visit.   Follow up plan: Return in about 4 months (around 01/23/2020) for Follow up with Pre-visit Labs, Next Visit A1c in Office.  Glade Lloyd, MD Phone: [phone]  Fax: [phone]   This note was partially dictated with voice recognition software. Similar sounding words can be transcribed inadequately or may not  be corrected upon review.  09/23/2019, 8:23 PM

## 2019-10-22 ENCOUNTER — Other Ambulatory Visit: Payer: Self-pay | Admitting: "Endocrinology

## 2019-10-22 DIAGNOSIS — E119 Type 2 diabetes mellitus without complications: Secondary | ICD-10-CM

## 2020-01-14 ENCOUNTER — Other Ambulatory Visit: Payer: Self-pay

## 2020-01-14 DIAGNOSIS — E119 Type 2 diabetes mellitus without complications: Secondary | ICD-10-CM

## 2020-01-26 ENCOUNTER — Ambulatory Visit: Payer: Medicaid Other | Admitting: "Endocrinology

## 2020-01-27 LAB — LIPID PANEL
Cholesterol: 176 mg/dL (ref <200)
HDL: 45 mg/dL (ref >=40)
LDL Cholesterol (Calc): 109 mg/dL (calc) — ABNORMAL HIGH
Non-HDL Cholesterol (Calc): 131 mg/dL (calc) — ABNORMAL HIGH (ref <130)
Total CHOL/HDL Ratio: 3.9 (calc) (ref <5.0)
Triglycerides: 110 mg/dL (ref <150)

## 2020-01-27 LAB — COMPLETE METABOLIC PANEL WITH GFR
AG Ratio: 1.2 (calc) (ref 1.0–2.5)
ALT: 26 U/L (ref 9–46)
AST: 21 U/L (ref 10–40)
Albumin: 3.7 g/dL (ref 3.6–5.1)
Alkaline phosphatase (APISO): 88 U/L (ref 36–130)
BUN: 8 mg/dL (ref 7–25)
CO2: 28 mmol/L (ref 20–32)
Calcium: 8.8 mg/dL (ref 8.6–10.3)
Chloride: 100 mmol/L (ref 98–110)
Creat: 0.63 mg/dL (ref 0.60–1.35)
GFR, Est African American: 160 mL/min/{1.73_m2} (ref >=60)
GFR, Est Non African American: 138 mL/min/{1.73_m2} (ref >=60)
Globulin: 3.2 g/dL (calc) (ref 1.9–3.7)
Glucose, Bld: 104 mg/dL — ABNORMAL HIGH (ref 65–99)
Potassium: 3.5 mmol/L (ref 3.5–5.3)
Sodium: 140 mmol/L (ref 135–146)
Total Bilirubin: 0.6 mg/dL (ref 0.2–1.2)
Total Protein: 6.9 g/dL (ref 6.1–8.1)

## 2020-01-27 LAB — TESTOSTERONE TOTAL,FREE,BIO, MALES
Albumin: 3.7 g/dL (ref 3.6–5.1)
Sex Hormone Binding: 15 nmol/L (ref 10–50)
Testosterone, Bioavailable: 292 ng/dL (ref >=110.0)
Testosterone, Free: 170.9 pg/mL (ref 46.0–224.0)
Testosterone: 614 ng/dL (ref 250–827)

## 2020-01-27 LAB — VITAMIN D 25 HYDROXY (VIT D DEFICIENCY, FRACTURES): Vit D, 25-Hydroxy: 17 ng/mL — ABNORMAL LOW (ref 30–100)

## 2020-01-27 LAB — MICROALBUMIN / CREATININE URINE RATIO
Creatinine, Urine: 189 mg/dL (ref 20–320)
Microalb Creat Ratio: 61 mcg/mg creat — ABNORMAL HIGH (ref <30)
Microalb, Ur: 11.6 mg/dL

## 2020-01-27 LAB — T4, FREE: Free T4: 1.2 ng/dL (ref 0.8–1.8)

## 2020-01-27 LAB — TSH: TSH: 2.97 mIU/L (ref 0.40–4.50)

## 2020-02-02 ENCOUNTER — Ambulatory Visit: Payer: Medicaid Other | Admitting: "Endocrinology

## 2020-02-04 ENCOUNTER — Encounter: Payer: Self-pay | Admitting: "Endocrinology

## 2020-02-04 ENCOUNTER — Telehealth (INDEPENDENT_AMBULATORY_CARE_PROVIDER_SITE_OTHER): Payer: Medicaid Other | Admitting: "Endocrinology

## 2020-02-04 VITALS — Ht 70.5 in | Wt >= 6400 oz

## 2020-02-04 DIAGNOSIS — I10 Essential (primary) hypertension: Secondary | ICD-10-CM | POA: Diagnosis not present

## 2020-02-04 DIAGNOSIS — E119 Type 2 diabetes mellitus without complications: Secondary | ICD-10-CM | POA: Diagnosis not present

## 2020-02-04 DIAGNOSIS — E291 Testicular hypofunction: Secondary | ICD-10-CM | POA: Diagnosis not present

## 2020-02-04 DIAGNOSIS — E782 Mixed hyperlipidemia: Secondary | ICD-10-CM | POA: Diagnosis not present

## 2020-02-04 NOTE — Patient Instructions (Signed)

## 2020-02-04 NOTE — Progress Notes (Signed)
02/04/2020                                    Endocrinology Telehealth Visit Follow up Note -During COVID -19 Pandemic  This visit type was conducted  via phone due to national recommendations for restrictions regarding the COVID-19 Pandemic  in an effort to limit this patient's exposure and mitigate transmission of the corona virus.   I connected with Allen Harding on 02/04/2020   by telephone and verified that I am speaking with the correct person using two identifiers. Allen Harding, August 05, 1995. he has verbally consented to this visit.  I was in my office and patient was in his residence. No other persons were with me during the encounter. All issues noted in this document were discussed and addressed. The format was not optimal for physical exam.   Subjective:    Patient ID: Allen Harding, male    DOB: 1995/08/12, PCP Rosita Fire, MD   Past Medical History:  Diagnosis Date  . ADHD (attention deficit hyperactivity disorder)   . Anxiety   . Autism disorder   . 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 Diabetes mellitus   . Hypertension   . Prediabetes    Past Surgical History:  Procedure Laterality Date  . CIRCUMCISION    . CIRCUMCISION REVISION    . FRENULECTOMY, LINGUAL    . lipoma    . ORCHIOPEXY     Social History   Socioeconomic History  . Marital status: Single    Spouse name: Not on file  . Number of children: Not on file  . Years of education: Not on file  . Highest education level: Not on file  Occupational History  . Not on file  Tobacco Use  . Smoking status: Former Smoker    Packs/day: 0.25    Quit date: 01/09/2014    Years since quitting: 6.0  . Smokeless tobacco: Never Used  Vaping Use  . Vaping Use: Never used  Substance and Sexual Activity  . Alcohol use: No  . Drug use: No  . Sexual activity: Not on file  Other Topics Concern  . Not on file  Social History Narrative   Lives with mom, sister, 2 nieces, grandparents and  sister's fiance.    Social Determinants of Health   Financial Resource Strain:   . Difficulty of Paying Living Expenses: Not on file  Food Insecurity:   . Worried About Charity fundraiser in the Last Year: Not on file  . Ran Out of Food in the Last Year: Not on file  Transportation Needs:   . Lack of Transportation (Medical): Not on file  . Lack of Transportation (Non-Medical): Not on file  Physical Activity:   . Days of Exercise per Week: Not on file  . Minutes of Exercise per Session: Not on file  Stress:   . Feeling of Stress : Not on file  Social Connections:   . Frequency of Communication with Friends and Family: Not on file  . Frequency of Social Gatherings with Friends and Family: Not on file  . Attends Religious Services: Not on file  . Active Member of Clubs or Organizations: Not on file  . Attends Archivist Meetings: Not on file  . Marital Status: Not on file   Outpatient Encounter Medications as of 02/04/2020  Medication Sig  . atomoxetine (  STRATTERA) 40 MG capsule Take 80 mg by mouth daily.  . cetirizine (ZYRTEC) 10 MG tablet TAKE ONE TABLET BY MOUTH DAILY.  . chlorthalidone (HYGROTON) 25 MG tablet Take 1 tablet (25 mg total) by mouth daily.  . cloNIDine (CATAPRES) 0.1 MG tablet Take 1 tablet (0.1 mg total) by mouth 2 (two) times daily.  . enalapril (VASOTEC) 20 MG tablet TAKE (1) TABLET BY MOUTH ONCE DAILY.  Marland Kitchen escitalopram (LEXAPRO) 10 MG tablet Take 10 mg by mouth daily.  Marland Kitchen guanFACINE (INTUNIV) 2 MG TB24 Take 2 mg by mouth daily.   Marland Kitchen letrozole (FEMARA) 2.5 MG tablet Take 1 tablet (2.5 mg total) by mouth daily.  . metFORMIN (GLUCOPHAGE) 500 MG tablet TAKE 1 TABLET BY MOUTH TWICE DAILY WITH MEALS.  Marland Kitchen Metoprolol Tartrate 75 MG TABS Take 75 mg by mouth 2 (two) times daily.  Marland Kitchen omeprazole (PRILOSEC) 40 MG capsule Take 1 capsule (40 mg total) by mouth 2 (two) times daily.  Marland Kitchen oxybutynin (DITROPAN-XL) 10 MG 24 hr tablet TAKE 1 TABLET BY MOUTH DAILY.  .  rosuvastatin (CRESTOR) 5 MG tablet Take 1 tablet (5 mg total) by mouth daily.  . sertraline (ZOLOFT) 25 MG tablet Take 100 mg by mouth daily. Reported on 06/16/2015  . SYRINGE-NEEDLE, DISP, 3 ML (BD ECLIPSE SYRINGE) 21G X 1" 3 ML MISC USE TO INJECT TESTOSTERONE WEEKLY  . testosterone cypionate (DEPOTESTOSTERONE CYPIONATE) 200 MG/ML injection Inject 0.5 mLs (100 mg total) into the muscle every 7 (seven) days.  . traZODone (DESYREL) 100 MG tablet Take 50 mg by mouth at bedtime. Reported on 06/16/2015  . Vitamin D, Ergocalciferol, (DRISDOL) 1.25 MG (50000 UNIT) CAPS capsule Take 1 capsule (50,000 Units total) by mouth every 7 (seven) days.   No facility-administered encounter medications on file as of 02/04/2020.   ALLERGIES: No Known Allergies VACCINATION STATUS: Immunization History  Administered Date(s) Administered  . DTaP 06/11/1995, 08/06/1995, 09/24/1995, 06/30/1996, 09/24/1999  . H1N1 11/24/2008  . HPV Quadrivalent 02/08/2014  . Hepatitis A, Ped/Adol-2 Dose 02/03/2013, 08/19/2013  . Hepatitis B 03/07/95, 05/07/1995, 09/24/1995  . HiB (PRP-OMP) 06/11/1995, 08/06/1995, 09/24/1995, 06/30/1996  . IPV 06/11/1995, 08/06/1995, 09/24/1995, 09/24/1999  . Influenza Whole 03/22/2009, 06/17/2011, 07/23/2012  . Influenza,inj,Quad PF,6+ Mos 05/02/2015, 03/30/2019  . MMR 03/31/1996, 09/24/1999  . Meningococcal Conjugate 11/24/2008, 08/19/2013  . Td 01/07/2006  . Tdap 01/07/2006  . Varicella 03/31/1996, 06/01/1997    Diabetes   24 year old male patient with medical history as above. 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  - He has been following at [place] until March 2017 with Dr. Baldo Ash. -He does not know for sure when he was started on testosterone therapy currently at 100 mg IM every  week.   -More recently, he has been  consistent taking his testosterone injections.  His previsit total testosterone is 614 increasing from 38.    -He has dealt with heavy weight almost all of his life, admits to dietary indiscretions including consumption of large quantities of sweetened beverages.  His recent  point-of-care A1c 6.3%, increasing from 5.9%.  -He is currently on metformin 500 mg p.o. twice daily.   -He was recently initiated on Crestor due to significant dyslipidemia.  His previsit lipid panel showed LDL improved to 109 from 145.    -He underwent orchidopexy for right-sided undescended testes. -He denies testicular injury, radiation, infection, STD. -He denies any history of head injury.  Objective:    Ht 5' 10.5" (1.791 m)   Wt (!) 420 lb (190.5 kg)   BMI 59.41 kg/m   Wt Readings from Last 3 Encounters:  02/04/20 (!) 420 lb (190.5 kg)  09/23/19 (!) 430 lb 9.6 oz (195.3 kg)  03/30/19 (!) 424 lb (192.3 kg)      CMP     Component Value Date/Time   NA 140 01/26/2020 1047   K 3.5 01/26/2020 1047   CL 100 01/26/2020 1047   CO2 28 01/26/2020 1047   GLUCOSE 104 (H) 01/26/2020 1047   BUN 8 01/26/2020 1047   CREATININE 0.63 01/26/2020 1047   CALCIUM 8.8 01/26/2020 1047   PROT 6.9 01/26/2020 1047   ALBUMIN 3.5 03/23/2019 1048   AST 21 01/26/2020 1047   ALT 26 01/26/2020 1047   ALKPHOS 79 03/23/2019 1048   BILITOT 0.6 01/26/2020 1047   GFRNONAA 138 01/26/2020 1047   GFRAA 160 01/26/2020 1047   Diabetic Labs (most recent): Lab Results  Component Value Date   HGBA1C 6.3 (A) 09/23/2019   HGBA1C 5.9 (H) 01/04/2019   HGBA1C 6.5 (H) 08/21/2018    Lipid Panel     Component Value Date/Time   CHOL 176 01/26/2020 1047   TRIG 110 01/26/2020 1047   HDL 45 01/26/2020 1047   CHOLHDL 3.9 01/26/2020 1047   VLDL 21 08/08/2015 1152   LDLCALC 109 (H) 01/26/2020 1047   Recent Results (from the past 2160 hour(s))  COMPLETE METABOLIC PANEL WITH GFR     Status: Abnormal   Collection Time: 01/26/20 10:47  AM  Result Value Ref Range   Glucose, Bld 104 (H) 65 - 99 mg/dL    Comment: .            Fasting reference interval . For someone without known diabetes, a glucose value between 100 and 125 mg/dL is consistent with prediabetes and should be confirmed with a follow-up test. .    BUN 8 7 - 25 mg/dL   Creat 0.63 0.60 - 1.35 mg/dL   GFR, Est Non African American 138 > OR = 60 mL/min/1.54m   GFR, Est African American 160 > OR = 60 mL/min/1.79m  BUN/Creatinine Ratio NOT APPLICABLE 6 - 22 (calc)   Sodium 140 135 - 146 mmol/L   Potassium 3.5 3.5 - 5.3 mmol/L   Chloride 100 98 - 110 mmol/L   CO2 28 20 - 32 mmol/L   Calcium 8.8 8.6 - 10.3 mg/dL   Total Protein 6.9 6.1 - 8.1 g/dL   Albumin 3.7 3.6 - 5.1 g/dL   Globulin 3.2 1.9 - 3.7 g/dL (calc)   AG Ratio 1.2 1.0 - 2.5 (calc)   Total Bilirubin 0.6 0.2 - 1.2 mg/dL   Alkaline phosphatase (APISO) 88 36 - 130 U/L   AST 21 10 - 40 U/L   ALT 26 9 - 46 U/L    Comment: Verified by repeat analysis. . Marland Kitchen Microalbumin / creatinine urine ratio     Status: Abnormal   Collection Time: 01/26/20 10:47 AM  Result Value Ref Range   Creatinine, Urine 189 20 - 320 mg/dL   Microalb, Ur 11.6 mg/dL    Comment: Reference Range Not established    Microalb Creat Ratio 61 (H) <30 mcg/mg creat    Comment: . The ADA defines abnormalities in albumin excretion as follows: . Marland Kitchenategory         Result (mcg/mg creatinine) . Normal                    <  30 Microalbuminuria         30-299  Clinical albuminuria   > OR = 300 . The ADA recommends that at least two of three specimens collected within a 3-6 month period be abnormal before considering a patient to be within a diagnostic category.   Lipid panel     Status: Abnormal   Collection Time: 01/26/20 10:47 AM  Result Value Ref Range   Cholesterol 176 <200 mg/dL   HDL 45 > OR = 40 mg/dL   Triglycerides 110 <150 mg/dL   LDL Cholesterol (Calc) 109 (H) mg/dL (calc)    Comment: Reference range:  <100 . Desirable range <100 mg/dL for primary prevention;   <70 mg/dL for patients with CHD or diabetic patients  with > or = 2 CHD risk factors. Marland Kitchen LDL-C is now calculated using the Martin-Hopkins  calculation, which is a validated novel method providing  better accuracy than the Friedewald equation in the  estimation of LDL-C.  Cresenciano Genre et al. Annamaria Helling. 3295;188(41): 2061-2068  (http://education.[facility].com/faq/FAQ164)    Total CHOL/HDL Ratio 3.9 <5.0 (calc)   Non-HDL Cholesterol (Calc) 131 (H) <130 mg/dL (calc)    Comment: For patients with diabetes plus 1 major ASCVD risk  factor, treating to a non-HDL-C goal of <100 mg/dL  (LDL-C of <70 mg/dL) is considered a therapeutic  option.   TSH     Status: None   Collection Time: 01/26/20 10:47 AM  Result Value Ref Range   TSH 2.97 0.40 - 4.50 mIU/L  T4, free     Status: None   Collection Time: 01/26/20 10:47 AM  Result Value Ref Range   Free T4 1.2 0.8 - 1.8 ng/dL  Testosterone Total,Free,Bio, Males     Status: None   Collection Time: 01/26/20 10:47 AM  Result Value Ref Range   Testosterone 614 250 - 827 ng/dL   Albumin 3.7 3.6 - 5.1 g/dL   Sex Hormone Binding 15 10 - 50 nmol/L   Testosterone, Free 170.9 46.0 - 224.0 pg/mL   Testosterone, Bioavailable 292.0 110.0 - 575 ng/dL  VITAMIN D 25 Hydroxy (Vit-D Deficiency, Fractures)     Status: Abnormal   Collection Time: 01/26/20 10:47 AM  Result Value Ref Range   Vit D, 25-Hydroxy 17 (L) 30 - 100 ng/mL    Comment: Vitamin D Status         25-OH Vitamin D: . Deficiency:                    <20 ng/mL Insufficiency:             20 - 29 ng/mL Optimal:                 > or = 30 ng/mL . For 25-OH Vitamin D testing on patients on  D2-supplementation and patients for whom quantitation  of D2 and D3 fractions is required, the [facility](TM) 25-OH VIT D, (D2,D3), LC/MS/MS is recommended: order  code [phone] (patients >23yr). See Note 1 . Note 1 . For additional information,  please refer to  http://education.[facility].com/faq/FAQ199  (This link is being provided for informational/ educational purposes only.)      Assessment & Plan:   1.  Type 2 diabetes -new diagnosis  -His recent point-of-care A1c 6.3% increasing from 5.9%.  He has benefited from metformin therapy.  He is advised to continue Metformin 500 mg p.o. twice daily.    - he  admits there is a room for improvement in his diet and  drink choices. -  Suggestion is made for him to avoid simple carbohydrates  from his diet including Cakes, Sweet Desserts / Pastries, Ice Cream, Soda (diet and regular), Sweet Tea, Candies, Chips, Cookies, Sweet Pastries,  Store Bought Juices, Alcohol in Excess of  1-2 drinks a day, Artificial Sweeteners, Coffee Creamer, and "Sugar-free" Products. This will help patient to have stable blood glucose profile and potentially avoid unintended weight gain.  His BMI 59.4-his measurement medical concern.  2. Hypogonadism  - Etiology most likely multifactorial including chromosomal abnormality and history of undescended testes.  - He has required testosterone supplement for several years now. I have reviewed his EMR records and found out that he had low testosterone starting from at least 2012.  -I have not seen FSH/LH levels, would have  been useful prior to initiation of testosterone therapy however the utility of that test now is unremarkable. 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  Due to his consistent injection of testosterone, his previsit total testosterone is improving to 614 from 38.  He is advised to continue testosterone injection 100 mg IM every 7 days.  Therapeutic goal is to keep his total testosterone above 250.  -He will need repeat testosterone measurement along with CBC during his next visit.   3. Undescended right testicle - He is status post orchidopexy of right testicle. The details of his surgical history are not available for review.  4)  hypertension he is advised to home monitor blood pressure and report if > 140/90 on 2 separate readings.  -He is advised to continue his current blood pressure medications including enalapril 20 mg p.o. daily, clonidine 0.5 mg p.o. twice daily, chlorthalidone 25 mg daily.  5) hyperlipidemia   -He has responded to the low-dose Crestor.  His previsit labs show LDL 109 improving from 145.  He is advised to continue Crestor 5 mg p.o. nightly .  He is advised to continue close follow-up with his PMD Dr. Legrand Rams.     - Time spent on this patient care encounter:  30 minutes of which 50% was spent in  counseling and the rest reviewing  his current and  previous labs / studies and medications  doses and developing a plan for long term care. Allen Harding  participated in the discussions, expressed understanding, and voiced agreement with the above plans.  All questions were answered to his satisfaction. he is encouraged to contact clinic should he have any questions or concerns prior to his return visit.    Follow up plan: Return in about 3 months (around 05/05/2020) for F/U with Pre-visit Labs, NV A1c in Office.  Glade Lloyd, MD Phone: [phone]  Fax: [phone]   This note was partially dictated with voice recognition software. Similar sounding words can be transcribed inadequately or may not  be corrected upon review.  02/04/2020, 11:48 AM

## 2020-03-10 ENCOUNTER — Encounter (HOSPITAL_COMMUNITY): Payer: Self-pay | Admitting: Emergency Medicine

## 2020-03-10 ENCOUNTER — Other Ambulatory Visit: Payer: Self-pay

## 2020-03-10 ENCOUNTER — Emergency Department (HOSPITAL_COMMUNITY)
Admission: EM | Admit: 2020-03-10 | Discharge: 2020-03-10 | Disposition: A | Discharge status: Home / Self Care | Payer: Medicaid Other | Attending: Emergency Medicine | Admitting: Emergency Medicine

## 2020-03-10 DIAGNOSIS — L539 Erythematous condition, unspecified: Secondary | ICD-10-CM | POA: Insufficient documentation

## 2020-03-10 DIAGNOSIS — E1165 Type 2 diabetes mellitus with hyperglycemia: Secondary | ICD-10-CM | POA: Insufficient documentation

## 2020-03-10 DIAGNOSIS — Z79899 Other long term (current) drug therapy: Secondary | ICD-10-CM | POA: Insufficient documentation

## 2020-03-10 DIAGNOSIS — Z87891 Personal history of nicotine dependence: Secondary | ICD-10-CM | POA: Insufficient documentation

## 2020-03-10 DIAGNOSIS — I10 Essential (primary) hypertension: Secondary | ICD-10-CM | POA: Diagnosis not present

## 2020-03-10 DIAGNOSIS — L723 Sebaceous cyst: Secondary | ICD-10-CM | POA: Diagnosis not present

## 2020-03-10 DIAGNOSIS — R220 Localized swelling, mass and lump, head: Secondary | ICD-10-CM | POA: Diagnosis present

## 2020-03-10 DIAGNOSIS — Z7984 Long term (current) use of oral hypoglycemic drugs: Secondary | ICD-10-CM | POA: Insufficient documentation

## 2020-03-10 DIAGNOSIS — F840 Autistic disorder: Secondary | ICD-10-CM | POA: Diagnosis not present

## 2020-03-10 MED ORDER — DOXYCYCLINE HYCLATE 100 MG PO CAPS: 100.0000 mg | ORAL_CAPSULE | Freq: Two times a day (BID) | ORAL | 0 refills | Status: DC

## 2020-03-10 NOTE — ED Triage Notes (Addendum)
Patient states that he noticed a knot on his head last night. Patient is unsure where the knot came from and denies fall or any known injury. Patient states no pain. Patient hypertensive in triage and states that he has taken his blood pressure medications tonight.

## 2020-03-10 NOTE — Discharge Instructions (Signed)
Contact a health care provider if: Your cyst develops symptoms of infection. Your condition is not improving or is getting worse. You develop a cyst that looks different from other cysts you have had. You have a fever. Get help right away if: Redness spreads from the cyst into the surrounding area. 

## 2020-03-10 NOTE — ED Provider Notes (Signed)
[facility] EMERGENCY DEPARTMENT Provider Note   CSN: [id number] Arrival date & time: 03/10/20  1831     History Chief Complaint  Patient presents with  . knot on head    Allen Harding is a 24 y.o. male.  With a past medical history of morbid obesity, prediabetes, chromosomal abnormality, autism spectrum disorder who presents emergency department chief complaint of "not on my head."  Patient states that he first noticed a knot on the top of his head which has gotten progressively larger over the past 24 hours.  He states that it does not hurt unless he touches it.  He has no other complaints at this time.  Patient is notably hypertensive.  He does state that he took his medications today.  His mother states he is very nervous.  HPI     Past Medical History:  Diagnosis Date  . ADHD (attention deficit hyperactivity disorder)   . Anxiety   . Autism disorder   . 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 Diabetes mellitus   . Hypertension   . Prediabetes     Patient Active Problem List   Diagnosis Date Noted  . Vitamin D deficiency 09/23/2019  . Mixed hyperlipidemia 05/21/2019  . Type 2 diabetes, HbA1c goal < 7% (HCC) 01/07/2019  . Leukocytosis 04/01/2017  . Undescended right testicle 12/21/2015  . Tachycardia 11/16/2015  . ADHD (attention deficit hyperactivity disorder)   . Allergic rhinitis 12/14/2014  . Iron deficiency anemia 09/29/2014  . Medication monitoring encounter 09/06/2014  . Enuresis 08/20/2013  . Well child check 08/20/2013  . Absence of bladder continence 08/20/2013  . Gynecomastia 07/21/2012  . Hypogonadism male 03/03/2012  . Testicular hypofunction 03/03/2012  . Dysthymia 10/29/2011  . Chromosomal abnormality syndrome   . Pre-diabetes 10/25/2010  . Morbid obesity due to excess calories (HCC) 10/25/2010  . Essential hypertension, benign 10/25/2010  . Goiter, unspecified 10/25/2010  . Autism 10/25/2010  . Autism spectrum 10/25/2010      Past Surgical History:  Procedure Laterality Date  . CIRCUMCISION    . CIRCUMCISION REVISION    . FRENULECTOMY, LINGUAL    . lipoma    . ORCHIOPEXY         Family History  Problem Relation Age of Onset  . Obesity Mother   . Diabetes Mother   . Hypertension Mother   . Hyperlipidemia Mother   . Obesity Sister   . Obesity Maternal Grandmother   . Diabetes Maternal Grandmother   . Cancer Maternal Grandmother   . Stroke Maternal Grandmother   . Obesity Maternal Grandfather     Social History   Tobacco Use  . Smoking status: Former Smoker    Packs/day: 0.25    Quit date: 01/09/2014    Years since quitting: 6.1  . Smokeless tobacco: Never Used  Vaping Use  . Vaping Use: Never used  Substance Use Topics  . Alcohol use: No  . Drug use: No    Home Medications Prior to Admission medications   Medication Sig Start Date End Date Taking? Authorizing Provider  atomoxetine (STRATTERA) 40 MG capsule Take 80 mg by mouth daily.    [provider]  cetirizine (ZYRTEC) 10 MG tablet TAKE ONE TABLET BY MOUTH DAILY. 01/01/16   McDonell, Alfredia Client, MD  chlorthalidone (HYGROTON) 25 MG tablet Take 1 tablet (25 mg total) by mouth daily. 01/22/18   Roma Kayser, MD  cloNIDine (CATAPRES) 0.1 MG tablet Take 1 tablet (0.1 mg total)  by mouth 2 (two) times daily. 05/25/18   Roma Kayser, MD  doxycycline (VIBRAMYCIN) 100 MG capsule Take 1 capsule (100 mg total) by mouth 2 (two) times daily. One po bid x 7 days 03/10/20   Arthor Captain, PA-C  enalapril (VASOTEC) 20 MG tablet TAKE (1) TABLET BY MOUTH ONCE DAILY. 01/04/19   Roma Kayser, MD  escitalopram (LEXAPRO) 10 MG tablet Take 10 mg by mouth daily.    [provider]  guanFACINE (INTUNIV) 2 MG TB24 Take 2 mg by mouth daily.     [provider]  letrozole (FEMARA) 2.5 MG tablet Take 1 tablet (2.5 mg total) by mouth daily. 04/20/15   Verneda Skill, FNP  metFORMIN (GLUCOPHAGE) 500 MG tablet  TAKE 1 TABLET BY MOUTH TWICE DAILY WITH MEALS. 10/25/19   Roma Kayser, MD  Metoprolol Tartrate 75 MG TABS Take 75 mg by mouth 2 (two) times daily. 11/17/15   Avon Gully, MD  omeprazole (PRILOSEC) 40 MG capsule Take 1 capsule (40 mg total) by mouth 2 (two) times daily. 08/18/15   Dessa Phi, MD  oxybutynin (DITROPAN-XL) 10 MG 24 hr tablet TAKE 1 TABLET BY MOUTH DAILY. 12/14/14   McDonell, Alfredia Client, MD  rosuvastatin (CRESTOR) 5 MG tablet Take 1 tablet (5 mg total) by mouth daily. 05/21/19   Roma Kayser, MD  sertraline (ZOLOFT) 25 MG tablet Take 100 mg by mouth daily. Reported on 06/16/2015    [provider]  SYRINGE-NEEDLE, DISP, 3 ML (BD ECLIPSE SYRINGE) 21G X 1" 3 ML MISC USE TO INJECT TESTOSTERONE WEEKLY 09/23/19   Roma Kayser, MD  testosterone cypionate (DEPOTESTOSTERONE CYPIONATE) 200 MG/ML injection Inject 0.5 mLs (100 mg total) into the muscle every 7 (seven) days. 09/23/19   Roma Kayser, MD  traZODone (DESYREL) 100 MG tablet Take 50 mg by mouth at bedtime. Reported on 06/16/2015    [provider]  Vitamin D, Ergocalciferol, (DRISDOL) 1.25 MG (50000 UNIT) CAPS capsule Take 1 capsule (50,000 Units total) by mouth every 7 (seven) days. 09/23/19   Roma Kayser, MD    Allergies    Patient has no known allergies.  Review of Systems   Review of Systems Ten systems reviewed and are negative for acute change, except as noted in the HPI.  Physical Exam Updated Vital Signs BP (!) 164/131   Pulse (!) 107   Temp 98.9 F (37.2 C) (Oral)   Resp 16   Ht 5' 10.5" (1.791 m)   Wt (!) 195.4 kg   SpO2 96%   BMI 60.94 kg/m   Physical Exam Vitals and nursing note reviewed.  Constitutional:      General: He is not in acute distress.    Appearance: He is well-developed. He is not diaphoretic.  HENT:     Head: Normocephalic and atraumatic.     Comments: Tender subcutaneous nodule on the top of the patient's head.  Mild erythema, no  fluctuance Eyes:     General: No scleral icterus.    Conjunctiva/sclera: Conjunctivae normal.  Cardiovascular:     Rate and Rhythm: Normal rate and regular rhythm.     Heart sounds: Normal heart sounds.  Pulmonary:     Effort: Pulmonary effort is normal. No respiratory distress.     Breath sounds: Normal breath sounds.  Abdominal:     Palpations: Abdomen is soft.     Tenderness: There is no abdominal tenderness.  Musculoskeletal:     Cervical back: Normal range  of motion and neck supple.  Skin:    General: Skin is warm and dry.  Neurological:     Mental Status: He is alert.  Psychiatric:        Behavior: Behavior normal.     ED Results / Procedures / Treatments   Labs (all labs ordered are listed, but only abnormal results are displayed) Labs Reviewed - No data to display  EKG None  Radiology No results found.  Procedures Procedures (including critical care time)  Medications Ordered in ED Medications - No data to display  ED Course  I have reviewed the triage vital signs and the nursing notes.  Pertinent labs & imaging results that were available during my care of the patient were reviewed by me and considered in my medical decision making (see chart for details).    MDM Rules/Calculators/A&P                          Patient with apparent inflamed cyst.  No evidence of active abscess.  Treat with doxycycline.  Outpatient follow-up.  Discussed return precautions. Final Clinical Impression(s) / ED Diagnoses Final diagnoses:  Inflamed sebaceous cyst    Rx / DC Orders ED Discharge Orders         Ordered    doxycycline (VIBRAMYCIN) 100 MG capsule  2 times daily,   Status:  Discontinued        03/10/20 2213    doxycycline (VIBRAMYCIN) 100 MG capsule  2 times daily        03/10/20 2214           Arthor Captain, PA-C 03/10/20 2248    Mancel Bale, MD 03/10/20 2325

## 2020-03-10 NOTE — ED Notes (Signed)
Abby PA at bedside,  °

## 2020-03-22 ENCOUNTER — Other Ambulatory Visit: Payer: Self-pay

## 2020-03-22 ENCOUNTER — Inpatient Hospital Stay (HOSPITAL_COMMUNITY): Payer: Medicaid Other | Attending: Hematology

## 2020-03-22 DIAGNOSIS — D72829 Elevated white blood cell count, unspecified: Secondary | ICD-10-CM | POA: Insufficient documentation

## 2020-03-22 DIAGNOSIS — D75839 Thrombocytosis, unspecified: Secondary | ICD-10-CM | POA: Insufficient documentation

## 2020-03-22 DIAGNOSIS — D696 Thrombocytopenia, unspecified: Secondary | ICD-10-CM

## 2020-03-22 DIAGNOSIS — Z23 Encounter for immunization: Secondary | ICD-10-CM | POA: Insufficient documentation

## 2020-03-22 LAB — CBC WITH DIFFERENTIAL/PLATELET
Abs Immature Granulocytes: 0.05 10*3/uL (ref 0.00–0.07)
Basophils Absolute: 0.1 10*3/uL (ref 0.0–0.1)
Basophils Relative: 1 %
Eosinophils Absolute: 0.4 10*3/uL (ref 0.0–0.5)
Eosinophils Relative: 3 %
HCT: 44.5 % (ref 39.0–52.0)
Hemoglobin: 14.4 g/dL (ref 13.0–17.0)
Immature Granulocytes: 0 %
Lymphocytes Relative: 30 %
Lymphs Abs: 3.9 10*3/uL (ref 0.7–4.0)
MCH: 26.6 pg (ref 26.0–34.0)
MCHC: 32.4 g/dL (ref 30.0–36.0)
MCV: 82.3 fL (ref 80.0–100.0)
Monocytes Absolute: 0.7 10*3/uL (ref 0.1–1.0)
Monocytes Relative: 6 %
Neutro Abs: 7.9 10*3/uL — ABNORMAL HIGH (ref 1.7–7.7)
Neutrophils Relative %: 60 %
Platelets: 426 10*3/uL — ABNORMAL HIGH (ref 150–400)
RBC: 5.41 MIL/uL (ref 4.22–5.81)
RDW: 13.9 % (ref 11.5–15.5)
WBC: 13 10*3/uL — ABNORMAL HIGH (ref 4.0–10.5)
nRBC: 0 % (ref 0.0–0.2)

## 2020-03-22 LAB — LACTATE DEHYDROGENASE: LDH: 180 U/L (ref 98–192)

## 2020-03-22 LAB — COMPREHENSIVE METABOLIC PANEL
ALT: 37 U/L (ref 0–44)
AST: 30 U/L (ref 15–41)
Albumin: 3.6 g/dL (ref 3.5–5.0)
Alkaline Phosphatase: 79 U/L (ref 38–126)
Anion gap: 11 (ref 5–15)
BUN: 10 mg/dL (ref 6–20)
CO2: 25 mmol/L (ref 22–32)
Calcium: 8.9 mg/dL (ref 8.9–10.3)
Chloride: 101 mmol/L (ref 98–111)
Creatinine, Ser: 0.69 mg/dL (ref 0.61–1.24)
GFR, Estimated: >60 mL/min (ref >60)
Glucose, Bld: 108 mg/dL — ABNORMAL HIGH (ref 70–99)
Potassium: 3.1 mmol/L — ABNORMAL LOW (ref 3.5–5.1)
Sodium: 137 mmol/L (ref 135–145)
Total Bilirubin: 0.8 mg/dL (ref 0.3–1.2)
Total Protein: 8 g/dL (ref 6.5–8.1)

## 2020-03-22 LAB — RETICULOCYTES
Immature Retic Fract: 10.9 % (ref 2.3–15.9)
RBC.: 5.49 MIL/uL (ref 4.22–5.81)
Retic Count, Absolute: 76.3 10*3/uL (ref 19.0–186.0)
Retic Ct Pct: 1.4 % (ref 0.4–3.1)

## 2020-03-29 ENCOUNTER — Ambulatory Visit (HOSPITAL_COMMUNITY): Payer: Medicaid Other | Admitting: Nurse Practitioner

## 2020-03-30 ENCOUNTER — Inpatient Hospital Stay (HOSPITAL_BASED_OUTPATIENT_CLINIC_OR_DEPARTMENT_OTHER): Payer: Medicaid Other | Admitting: Hematology

## 2020-03-30 ENCOUNTER — Other Ambulatory Visit: Payer: Self-pay

## 2020-03-30 VITALS — BP 190/120 | HR 102 | Temp 97.5°F | Resp 22 | Wt >= 6400 oz

## 2020-03-30 DIAGNOSIS — Z23 Encounter for immunization: Secondary | ICD-10-CM | POA: Diagnosis not present

## 2020-03-30 DIAGNOSIS — D72829 Elevated white blood cell count, unspecified: Secondary | ICD-10-CM

## 2020-03-30 DIAGNOSIS — D75839 Thrombocytosis, unspecified: Secondary | ICD-10-CM

## 2020-03-30 MED ORDER — INFLUENZA VAC SPLIT QUAD 0.5 ML IM SUSY
0.5000 mL | PREFILLED_SYRINGE | Freq: Once | INTRAMUSCULAR | Status: AC
  Administered 2020-03-30: 0.5 mL via INTRAMUSCULAR

## 2020-03-30 MED ORDER — INFLUENZA VAC SPLIT QUAD 0.5 ML IM SUSY
PREFILLED_SYRINGE | INTRAMUSCULAR | Status: AC
  Filled 2020-03-30: qty 0.5

## 2020-03-30 NOTE — Patient Instructions (Signed)
[facility] at [facility] Discharge Instructions  You were seen today by Dr. Ellin Saba. He went over your recent results. Eat more fruits daily to increase your potassium level. Dr. Ellin Saba will see you back in 1 year for labs and follow up.   Thank you for choosing [facility] at [facility] to provide your oncology and hematology care.  To afford each patient quality time with our provider, please arrive at least 15 minutes before your scheduled appointment time.   If you have a lab appointment with the Cancer Center please come in thru the Main Entrance and check in at the main information desk  You need to re-schedule your appointment should you arrive 10 or more minutes late.  We strive to give you quality time with our providers, and arriving late affects you and other patients whose appointments are after yours.  Also, if you no show three or more times for appointments you may be dismissed from the clinic at the providers discretion.     Again, thank you for choosing [facility].  Our hope is that these requests will decrease the amount of time that you wait before being seen by our physicians.       _____________________________________________________________  Should you have questions after your visit to [facility], please contact our office at [phone] between the hours of 8:00 a.m. and 4:30 p.m.  Voicemails left after 4:00 p.m. will not be returned until the following business day.  For prescription refill requests, have your pharmacy contact our office and allow 72 hours.    Cancer Center Support Programs:   > Cancer Support Group  2nd Tuesday of the month 1pm-2pm, Journey Room

## 2020-03-30 NOTE — Progress Notes (Signed)
Patient received Flu shot per Dr. Marice Potter orders.  Pt. Tolerated well. No complaints of pain or discomfort verbalized.  Band-Aid placed. Pt discharged ambulatory with family member.

## 2020-03-30 NOTE — Progress Notes (Signed)
[facility] [address], [place] [address]   CLINIC:  Medical Oncology/Hematology  PCP:  Avon Gully, MD [address] [place] / [place] [address]  [phone]  REASON FOR VISIT:  Follow-up for neutrophilic leukocytosis and thrombocytosis  PRIOR THERAPY: None  CURRENT THERAPY: Observation  INTERVAL HISTORY:  [name], a 24 y.o. male, returns for routine follow-up for his neutrophilic leukocytosis and thrombocytosis. [name] was last seen on 03/30/2019.  Today he is accompanied by his mother and he reports feeling anxious. He had a fungal infection in his back and strep infection in his eyes. He has an itchy rash on his lower back since the middle of 2020. He is not taking any diuretics.  He got his flu shot today.   REVIEW OF SYSTEMS:  Review of Systems  Constitutional: Positive for fatigue (50%). Negative for appetite change.  Respiratory: Positive for cough and shortness of breath.   Skin: Positive for rash (itchy lower back).  Psychiatric/Behavioral: Positive for depression.    PAST MEDICAL/SURGICAL HISTORY:  Past Medical History:  Diagnosis Date  . ADHD (attention deficit hyperactivity disorder)   . Anxiety   . Autism disorder   . 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 Diabetes mellitus   . Hypertension   . Prediabetes    Past Surgical History:  Procedure Laterality Date  . CIRCUMCISION    . CIRCUMCISION REVISION    . FRENULECTOMY, LINGUAL    . lipoma    . ORCHIOPEXY      SOCIAL HISTORY:  Social History   Socioeconomic History  . Marital status: Single    Spouse name: Not on file  . Number of children: Not on file  . Years of education: Not on file  . Highest education level: Not on file  Occupational History  . Not on file  Tobacco Use  . Smoking status: Former Smoker    Packs/day: 0.25    Quit date: 01/09/2014    Years since quitting: 6.2  . Smokeless tobacco: Never Used  Vaping  Use  . Vaping Use: Never used  Substance and Sexual Activity  . Alcohol use: No  . Drug use: No  . Sexual activity: Not on file  Other Topics Concern  . Not on file  Social History Narrative   Lives with mom, sister, 2 nieces, grandparents and sister's fiance.    Social Determinants of Health   Financial Resource Strain:   . Difficulty of Paying Living Expenses: Not on file  Food Insecurity:   . Worried About Programme researcher, broadcasting/film/video in the Last Year: Not on file  . Ran Out of Food in the Last Year: Not on file  Transportation Needs:   . Lack of Transportation (Medical): Not on file  . Lack of Transportation (Non-Medical): Not on file  Physical Activity:   . Days of Exercise per Week: Not on file  . Minutes of Exercise per Session: Not on file  Stress:   . Feeling of Stress : Not on file  Social Connections:   . Frequency of Communication with Friends and Family: Not on file  . Frequency of Social Gatherings with Friends and Family: Not on file  . Attends Religious Services: Not on file  . Active Member of Clubs or Organizations: Not on file  . Attends Banker Meetings: Not on file  . Marital Status: Not on file  Intimate Partner Violence:   . Fear of  Current or Ex-Partner: Not on file  . Emotionally Abused: Not on file  . Physically Abused: Not on file  . Sexually Abused: Not on file    FAMILY HISTORY:  Family History  Problem Relation Age of Onset  . Obesity Mother   . Diabetes Mother   . Hypertension Mother   . Hyperlipidemia Mother   . Obesity Sister   . Obesity Maternal Grandmother   . Diabetes Maternal Grandmother   . Cancer Maternal Grandmother   . Stroke Maternal Grandmother   . Obesity Maternal Grandfather     CURRENT MEDICATIONS:  Current Outpatient Medications  Medication Sig Dispense Refill  . atomoxetine (STRATTERA) 40 MG capsule Take 80 mg by mouth daily.    . cetirizine (ZYRTEC) 10 MG tablet TAKE ONE TABLET BY MOUTH DAILY. 30 tablet 0    . chlorthalidone (HYGROTON) 25 MG tablet Take 1 tablet (25 mg total) by mouth daily. 30 tablet 3  . cloNIDine (CATAPRES) 0.1 MG tablet Take 1 tablet (0.1 mg total) by mouth 2 (two) times daily. 60 tablet 11  . clotrimazole-betamethasone (LOTRISONE) cream Apply topically 2 (two) times daily.    . enalapril (VASOTEC) 20 MG tablet TAKE (1) TABLET BY MOUTH ONCE DAILY. 30 tablet 0  . escitalopram (LEXAPRO) 10 MG tablet Take 10 mg by mouth daily.    . ferrous sulfate 325 (65 FE) MG tablet Take by mouth.    . guanFACINE (INTUNIV) 2 MG TB24 Take 2 mg by mouth daily.     Marland Kitchen letrozole (FEMARA) 2.5 MG tablet Take 1 tablet (2.5 mg total) by mouth daily. 30 tablet 3  . metFORMIN (GLUCOPHAGE) 500 MG tablet TAKE 1 TABLET BY MOUTH TWICE DAILY WITH MEALS. 60 tablet 5  . Metoprolol Tartrate 75 MG TABS Take 75 mg by mouth 2 (two) times daily. 60 tablet 3  . omeprazole (PRILOSEC) 40 MG capsule Take 1 capsule (40 mg total) by mouth 2 (two) times daily. 60 capsule 6  . oxybutynin (DITROPAN-XL) 10 MG 24 hr tablet TAKE 1 TABLET BY MOUTH DAILY. 30 tablet 3  . rosuvastatin (CRESTOR) 5 MG tablet Take 1 tablet (5 mg total) by mouth daily. 30 tablet 3  . sertraline (ZOLOFT) 25 MG tablet Take 100 mg by mouth daily. Reported on 06/16/2015    . SYRINGE-NEEDLE, DISP, 3 ML (BD ECLIPSE SYRINGE) 21G X 1" 3 ML MISC USE TO INJECT TESTOSTERONE WEEKLY 50 each 1  . testosterone cypionate (DEPOTESTOSTERONE CYPIONATE) 200 MG/ML injection Inject 0.5 mLs (100 mg total) into the muscle every 7 (seven) days. 10 mL 0  . traZODone (DESYREL) 100 MG tablet Take 50 mg by mouth at bedtime. Reported on 06/16/2015    . Vitamin D, Ergocalciferol, (DRISDOL) 1.25 MG (50000 UNIT) CAPS capsule Take 1 capsule (50,000 Units total) by mouth every 7 (seven) days. 12 capsule 0   No current facility-administered medications for this visit.    ALLERGIES:  No Known Allergies  PHYSICAL EXAM:  Performance status (ECOG): 1 - Symptomatic but completely  ambulatory  Vitals:   03/30/20 1601  BP: (!) 190/120  Pulse: (!) 102  Resp: (!) 22  Temp: (!) 97.5 F (36.4 C)  SpO2: 99%   Wt Readings from Last 3 Encounters:  03/30/20 (!) 428 lb 12.7 oz (194.5 kg)  03/10/20 (!) 430 lb 12.8 oz (195.4 kg)  02/04/20 (!) 420 lb (190.5 kg)   Physical Exam Vitals reviewed.  Constitutional:      Appearance: Normal appearance. He is obese.  Cardiovascular:  Rate and Rhythm: Normal rate and regular rhythm.     Pulses: Normal pulses.     Heart sounds: Normal heart sounds.  Pulmonary:     Effort: Pulmonary effort is normal.     Breath sounds: Normal breath sounds.  Skin:    Findings: Rash (gluteal cleft) present.  Neurological:     General: No focal deficit present.     Mental Status: He is alert and oriented to person, place, and time.  Psychiatric:        Mood and Affect: Mood normal.        Behavior: Behavior normal.     LABORATORY DATA:  I have reviewed the labs as listed.  CBC Latest Ref Rng & Units 03/22/2020 03/23/2019 01/04/2019  WBC 4.0 - 10.5 K/uL 13.0(H) 12.3(H) 14.2(H)  Hemoglobin 13.0 - 17.0 g/dL 97.6 73.4 19.3  Hematocrit 39 - 52 % 44.5 46.5 40.5  Platelets 150 - 400 K/uL 426(H) 414(H) 415(H)   CMP Latest Ref Rng & Units 03/22/2020 01/26/2020 03/23/2019  Glucose 70 - 99 mg/dL 790(W) 409(B) 353(G)  BUN 6 - 20 mg/dL 10 8 8   Creatinine 0.61 - 1.24 mg/dL 9.92 4.26  Sodium 135 - 145 mmol/L 137 140 139  Potassium 3.5 - 5.1 mmol/L 3.1(L) 3.5 3.2(L)  Chloride 98 - 111 mmol/L 101 100 99  CO2 22 - 32 mmol/L 25 28 28   Calcium 8.9 - 10.3 mg/dL 8.9 8.8 8.34)  Total Protein 6.5 - 8.1 g/dL 8.0 6.9 7.6  Total Bilirubin 0.3 - 1.2 mg/dL 0.8 0.6 0.4  Alkaline Phos 38 - 126 U/L 79 - 79  AST 15 - 41 U/L 30 21 24   ALT 0 - 44 U/L 37 26 27      Component Value Date/Time   RBC 5.41 03/22/2020 1320   RBC 5.49 03/22/2020 1304   MCV 82.3 03/22/2020 1320   MCH 26.6 03/22/2020 1320   MCHC 32.4 03/22/2020 1320   RDW 13.9 03/22/2020  1320   LYMPHSABS 3.9 03/22/2020 1320   MONOABS 0.7 03/22/2020 1320   EOSABS 0.4 03/22/2020 1320   BASOSABS 0.1 03/22/2020 1320   Lab Results  Component Value Date   LDH 180 03/22/2020   LDH 168 03/23/2019   LDH 162 03/24/2018    DIAGNOSTIC IMAGING:  I have independently reviewed the scans and discussed with the patient. No results found.   ASSESSMENT:  1.  Neutrophilic leukocytosis: -Previously tested for BCR/ABL and Jak 2 V6 2F mutation and was negative. -He is a non-smoker. -Denies any skin infections, dental infections.  Denies any steroid use.  Denies any history of splenectomy. -Denies any fevers, night sweats or weight loss. -We reviewed labs from 03/23/2019 which showed white count of 12.3.  Hemoglobin was normal at 14.3. -Ultrasound of the abdomen on 03/24/2017 showed normal-sized spleen.  Hepatic steatosis.  2.  Thrombocytosis: -He has history of mildly elevated platelet count for many years.   PLAN:  1.  Neutrophilic leukocytosis: -He does not report any major infections or hospitalizations in the last 1 year. -Reviewed his CBC from 03/22/2020.  White count is more or less stable around 13, predominantly neutrophils.  Hemoglobin is normal. 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 -RTC 1 year with repeat labs.  2.  Thrombocytosis: -Platelet count is 426.  His platelet count ranged anywhere between 400-460 in the past. -Consider further work-up with CALR and MPL testing if significant elevation.  Orders placed this encounter:  No orders of the defined types were placed in this encounter.    Derek Jack, MD [place] [phone]   I, Milinda Antis, am acting as a scribe for Dr. Sanda Linger.  I, Derek Jack MD, have reviewed the above documentation for accuracy and completeness, and I agree with the above.

## 2020-03-31 ENCOUNTER — Other Ambulatory Visit (HOSPITAL_COMMUNITY): Payer: Self-pay | Admitting: *Deleted

## 2020-04-30 LAB — CBC WITH DIFFERENTIAL/PLATELET
Basophils Absolute: 0.1 10*3/uL (ref 0.0–0.2)
Basos: 0 %
EOS (ABSOLUTE): 0.5 10*3/uL — ABNORMAL HIGH (ref 0.0–0.4)
Eos: 3 %
Hematocrit: 43.6 % (ref 37.5–51.0)
Hemoglobin: 14.2 g/dL (ref 13.0–17.7)
Immature Grans (Abs): 0 10*3/uL (ref 0.0–0.1)
Immature Granulocytes: 0 %
Lymphocytes Absolute: 4.1 10*3/uL — ABNORMAL HIGH (ref 0.7–3.1)
Lymphs: 25 %
MCH: 26.8 pg (ref 26.6–33.0)
MCHC: 32.6 g/dL (ref 31.5–35.7)
MCV: 82 fL (ref 79–97)
Monocytes Absolute: 0.7 10*3/uL (ref 0.1–0.9)
Monocytes: 4 %
Neutrophils Absolute: 11.1 10*3/uL — ABNORMAL HIGH (ref 1.4–7.0)
Neutrophils: 68 %
Platelets: 414 10*3/uL (ref 150–450)
RBC: 5.29 x10E6/uL (ref 4.14–5.80)
RDW: 14.1 % (ref 11.6–15.4)
WBC: 16.5 10*3/uL — ABNORMAL HIGH (ref 3.4–10.8)

## 2020-04-30 LAB — TESTOSTERONE, FREE, TOTAL, SHBG
Sex Hormone Binding: 14.7 nmol/L — ABNORMAL LOW (ref 16.5–55.9)
Testosterone, Free: 7.2 pg/mL — ABNORMAL LOW (ref 9.3–26.5)
Testosterone: 115 ng/dL — ABNORMAL LOW (ref 264–916)

## 2020-05-05 ENCOUNTER — Other Ambulatory Visit: Payer: Self-pay

## 2020-05-05 ENCOUNTER — Other Ambulatory Visit: Payer: Self-pay | Admitting: "Endocrinology

## 2020-05-05 ENCOUNTER — Ambulatory Visit (INDEPENDENT_AMBULATORY_CARE_PROVIDER_SITE_OTHER): Payer: Medicaid Other | Admitting: "Endocrinology

## 2020-05-05 ENCOUNTER — Encounter: Payer: Self-pay | Admitting: "Endocrinology

## 2020-05-05 VITALS — BP 148/82 | HR 88 | Ht 71.0 in

## 2020-05-05 DIAGNOSIS — I10 Essential (primary) hypertension: Secondary | ICD-10-CM

## 2020-05-05 DIAGNOSIS — E559 Vitamin D deficiency, unspecified: Secondary | ICD-10-CM

## 2020-05-05 DIAGNOSIS — E291 Testicular hypofunction: Secondary | ICD-10-CM

## 2020-05-05 DIAGNOSIS — E119 Type 2 diabetes mellitus without complications: Secondary | ICD-10-CM | POA: Diagnosis not present

## 2020-05-05 DIAGNOSIS — E782 Mixed hyperlipidemia: Secondary | ICD-10-CM | POA: Diagnosis not present

## 2020-05-05 LAB — POCT GLYCOSYLATED HEMOGLOBIN (HGB A1C): HbA1c, POC (controlled diabetic range): 6.8 % (ref 0.0–7.0)

## 2020-05-05 MED ORDER — CLONIDINE HCL 0.2 MG PO TABS
0.2000 mg | ORAL_TABLET | Freq: Two times a day (BID) | ORAL | 1 refills | Status: DC
Start: 2020-05-05 — End: 2022-11-19

## 2020-05-05 NOTE — Patient Instructions (Signed)

## 2020-05-05 NOTE — Progress Notes (Signed)
05/05/2020      Endocrinology follow-up note    Subjective:    Patient ID: Allen Harding, male    DOB: [birth year], PCP Rosita Fire, MD   Past Medical History:  Diagnosis Date  . ADHD (attention deficit hyperactivity disorder)   . Anxiety   . Autism disorder   . 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 Diabetes mellitus   . Hypertension   . Prediabetes    Past Surgical History:  Procedure Laterality Date  . CIRCUMCISION    . CIRCUMCISION REVISION    . FRENULECTOMY, LINGUAL    . lipoma    . ORCHIOPEXY     Social History   Socioeconomic History  . Marital status: Single    Spouse name: Not on file  . Number of children: Not on file  . Years of education: Not on file  . Highest education level: Not on file  Occupational History  . Not on file  Tobacco Use  . Smoking status: Former Smoker    Packs/day: 0.25    Quit date: 01/09/2014    Years since quitting: 6.3  . Smokeless tobacco: Never Used  Vaping Use  . Vaping Use: Never used  Substance and Sexual Activity  . Alcohol use: No  . Drug use: No  . Sexual activity: Not on file  Other Topics Concern  . Not on file  Social History Narrative   Lives with mom, sister, 2 nieces, grandparents and sister's fiance.    Social Determinants of Health   Financial Resource Strain:   . Difficulty of Paying Living Expenses: Not on file  Food Insecurity:   . Worried About Charity fundraiser in the Last Year: Not on file  . Ran Out of Food in the Last Year: Not on file  Transportation Needs:   . Lack of Transportation (Medical): Not on file  . Lack of Transportation (Non-Medical): Not on file  Physical Activity:   . Days of Exercise per Week: Not on file  . Minutes of Exercise per Session: Not on file  Stress:   . Feeling of Stress : Not on file  Social Connections:   . Frequency of Communication with Friends and Family: Not on file  . Frequency of Social Gatherings with Friends and Family:  Not on file  . Attends Religious Services: Not on file  . Active Member of Clubs or Organizations: Not on file  . Attends Archivist Meetings: Not on file  . Marital Status: Not on file   Outpatient Encounter Medications as of 05/05/2020  Medication Sig  . cetirizine (ZYRTEC) 10 MG tablet TAKE ONE TABLET BY MOUTH DAILY.  . chlorthalidone (HYGROTON) 25 MG tablet Take 1 tablet (25 mg total) by mouth daily.  . cloNIDine (CATAPRES) 0.2 MG tablet Take 1 tablet (0.2 mg total) by mouth 2 (two) times daily.  . clotrimazole-betamethasone (LOTRISONE) cream Apply topically 2 (two) times daily.  . enalapril (VASOTEC) 20 MG tablet TAKE (1) TABLET BY MOUTH ONCE DAILY.  . ferrous sulfate 325 (65 FE) MG tablet Take by mouth.  . letrozole (FEMARA) 2.5 MG tablet Take 1 tablet (2.5 mg total) by mouth daily.  . metFORMIN (GLUCOPHAGE) 500 MG tablet TAKE 1 TABLET BY MOUTH TWICE DAILY WITH MEALS.  Marland Kitchen Metoprolol Tartrate 75 MG TABS Take 75 mg by mouth 2 (two) times daily.  Marland Kitchen omeprazole (PRILOSEC) 40 MG capsule Take 1 capsule (40 mg total) by mouth 2 (two) times  daily.  . oxybutynin (DITROPAN-XL) 10 MG 24 hr tablet TAKE 1 TABLET BY MOUTH DAILY.  . rosuvastatin (CRESTOR) 5 MG tablet Take 1 tablet (5 mg total) by mouth daily.  . SYRINGE-NEEDLE, DISP, 3 ML (BD ECLIPSE SYRINGE) 21G X 1" 3 ML MISC USE TO INJECT TESTOSTERONE WEEKLY  . testosterone cypionate (DEPOTESTOSTERONE CYPIONATE) 200 MG/ML injection Inject 0.5 mLs (100 mg total) into the muscle every 7 (seven) days.  . traZODone (DESYREL) 100 MG tablet Take 50 mg by mouth at bedtime. Reported on 06/16/2015  . Vitamin D, Ergocalciferol, (DRISDOL) 1.25 MG (50000 UNIT) CAPS capsule Take 1 capsule (50,000 Units total) by mouth every 7 (seven) days.  . [DISCONTINUED] atomoxetine (STRATTERA) 40 MG capsule Take 80 mg by mouth daily.  . [DISCONTINUED] cloNIDine (CATAPRES) 0.1 MG tablet Take 1 tablet (0.1 mg total) by mouth 2 (two) times daily.  . [DISCONTINUED]  escitalopram (LEXAPRO) 10 MG tablet Take 10 mg by mouth daily.  . [DISCONTINUED] guanFACINE (INTUNIV) 2 MG TB24 Take 2 mg by mouth daily.   . [DISCONTINUED] sertraline (ZOLOFT) 25 MG tablet Take 100 mg by mouth daily. Reported on 06/16/2015   No facility-administered encounter medications on file as of 05/05/2020.   ALLERGIES: No Known Allergies VACCINATION STATUS: Immunization History  Administered Date(s) Administered  . DTaP 06/11/1995, 08/06/1995, 09/24/1995, 06/30/1996, 09/24/1999  . H1N1 11/24/2008  . HPV Quadrivalent 02/08/2014  . Hepatitis A, Ped/Adol-2 Dose 02/03/2013, 08/19/2013  . Hepatitis B 07/29/1994, 05/07/1995, 09/24/1995  . HiB (PRP-OMP) 06/11/1995, 08/06/1995, 09/24/1995, 06/30/1996  . IPV 06/11/1995, 08/06/1995, 09/24/1995, 09/24/1999  . Influenza Whole 03/22/2009, 06/17/2011, 07/23/2012  . Influenza,inj,Quad PF,6+ Mos 05/02/2015, 03/30/2019, 03/30/2020  . MMR 03/31/1996, 09/24/1999  . Meningococcal Conjugate 11/24/2008, 08/19/2013  . Td 01/07/2006  . Tdap 01/07/2006  . Varicella 03/31/1996, 06/01/1997    Diabetes   24 year old male patient with medical history as above. 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  - He has been following at [facility] until March 2017 with Dr. Baldo Ash. -He does not know for sure when he was started on testosterone therapy.  He was supposed to be on testosterone 100 mg IM every 7 days, however he admits to inconsistency.  His previsit total testosterone is dropping to 115 from 614.    -He has dealt with heavy weight almost all of his life, admits to dietary indiscretions including consumption of large quantities of sweetened beverages.  His point-of-care A1c today 6.8%, increasing from 6.3%.    -He is currently on metformin 500 mg p.o. twice daily.   -He was recently  initiated on Crestor due to significant dyslipidemia.  His previsit lipid panel showed LDL improved to 109 from 145.    -He underwent orchidopexy for right-sided undescended testes. -He denies testicular injury, radiation, infection, STD. -He denies any history of head injury.    Objective:    BP (!) 148/82   Pulse 88   Ht '5\' 11"'  (1.803 m)   BMI 59.80 kg/m   Wt Readings from Last 3 Encounters:  03/30/20 (!) 428 lb 12.7 oz (194.5 kg)  03/10/20 (!) 430 lb 12.8 oz (195.4 kg)  02/04/20 (!) 420 lb (190.5 kg)      CMP     Component Value Date/Time   NA 137 03/22/2020 1320   K 3.1 (L) 03/22/2020 1320   CL 101 03/22/2020 1320   CO2 25 03/22/2020 1320   GLUCOSE 108 (H)  03/22/2020 1320   BUN 10 03/22/2020 1320   CREATININE 0.69 03/22/2020 1320   CREATININE 0.63 01/26/2020 1047   CALCIUM 8.9 03/22/2020 1320   PROT 8.0 03/22/2020 1320   ALBUMIN 3.6 03/22/2020 1320   AST 30 03/22/2020 1320   ALT 37 03/22/2020 1320   ALKPHOS 79 03/22/2020 1320   BILITOT 0.8 03/22/2020 1320   GFRNONAA >60 03/22/2020 1320   GFRNONAA 138 01/26/2020 1047   GFRAA 160 01/26/2020 1047   Diabetic Labs (most recent): Lab Results  Component Value Date   HGBA1C 6.8 05/05/2020   HGBA1C 6.3 (A) 09/23/2019   HGBA1C 5.9 (H) 01/04/2019    Lipid Panel     Component Value Date/Time   CHOL 176 01/26/2020 1047   TRIG 110 01/26/2020 1047   HDL 45 01/26/2020 1047   CHOLHDL 3.9 01/26/2020 1047   VLDL 21 08/08/2015 1152   LDLCALC 109 (H) 01/26/2020 1047   Recent Results (from the past 2160 hour(s))  Reticulocytes     Status: None   Collection Time: 03/22/20  1:04 PM  Result Value Ref Range   Retic Ct Pct 1.4 0.4 - 3.1 %   RBC. 5.49 4.22 - 5.81 MIL/uL   Retic Count, Absolute 76.3 19.0 - 186.0 K/uL   Immature Retic Fract 10.9 2.3 - 15.9 %    Comment: Performed at [facility], [address] [place], [place] [address]  Lactate dehydrogenase     Status: None   Collection Time: 03/22/20  1:20 PM   Result Value Ref Range   LDH 180 98 - 192 U/L    Comment: Performed at [facility], [address] [place], [place] [address]  CBC with Differential     Status: Abnormal   Collection Time: 03/22/20  1:20 PM  Result Value Ref Range   WBC 13.0 (H) 4.0 - 10.5 K/uL   RBC 5.41 4.22 - 5.81 MIL/uL   Hemoglobin 14.4 13.0 - 17.0 g/dL   HCT 44.5 39 - 52 %   MCV 82.3 80.0 - 100.0 fL   MCH 26.6 26.0 - 34.0 pg   MCHC 32.4 30.0 - 36.0 g/dL   RDW 13.9 11.5 - 15.5 %   Platelets 426 (H) 150 - 400 K/uL   nRBC 0.0 0.0 - 0.2 %   Neutrophils Relative % 60 %   Neutro Abs 7.9 (H) 1.7 - 7.7 K/uL   Lymphocytes Relative 30 %   Lymphs Abs 3.9 0.7 - 4.0 K/uL   Monocytes Relative 6 %   Monocytes Absolute 0.7 0.1 - 1.0 K/uL   Eosinophils Relative 3 %   Eosinophils Absolute 0.4 0.0 - 0.5 K/uL   Basophils Relative 1 %   Basophils Absolute 0.1 0.0 - 0.1 K/uL   Immature Granulocytes 0 %   Abs Immature Granulocytes 0.05 0.00 - 0.07 K/uL    Comment: Performed at [facility], [address], [place], [place] [address]  Comprehensive metabolic panel     Status: Abnormal   Collection Time: 03/22/20  1:20 PM  Result Value Ref Range   Sodium 137 135 - 145 mmol/L   Potassium 3.1 (L) 3.5 - 5.1 mmol/L   Chloride 101 98 - 111 mmol/L   CO2 25 22 - 32 mmol/L   Glucose, Bld 108 (H) 70 - 99 mg/dL    Comment: Glucose reference range applies only to samples taken after fasting for at least 8 hours.   BUN 10 6 - 20 mg/dL   Creatinine, Ser 0.69 0.61 -  1.24 mg/dL   Calcium 8.9 8.9 - 10.3 mg/dL   Total Protein 8.0 6.5 - 8.1 g/dL   Albumin 3.6 3.5 - 5.0 g/dL   AST 30 15 - 41 U/L   ALT 37 0 - 44 U/L   Alkaline Phosphatase 79 38 - 126 U/L   Total Bilirubin 0.8 0.3 - 1.2 mg/dL   GFR, Estimated >60 >60 mL/min   Anion gap 11 5 - 15    Comment: Performed at [facility], [address], [place], [place] [address]  Testosterone, Free, Total, SHBG     Status: Abnormal   Collection Time: 04/26/20 11:05 AM  Result Value Ref  Range   Testosterone 115 (L) 264 - 916 ng/dL    Comment: Adult male reference interval is based on a population of healthy nonobese males (BMI <30) between 66 and 38 years old. [place], [place] [phone]. PMID: [id number].    Testosterone, Free 7.2 (L) 9.3 - 26.5 pg/mL   Sex Hormone Binding 14.7 (L) 16.5 - 55.9 nmol/L  CBC with Differential/Platelet     Status: Abnormal   Collection Time: 04/26/20 11:05 AM  Result Value Ref Range   WBC 16.5 (H) 3.4 - 10.8 x10E3/uL   RBC 5.29 4.14 - 5.80 x10E6/uL   Hemoglobin 14.2 13.0 - 17.7 g/dL   Hematocrit 43.6 37.5 - 51.0 %   MCV 82 79 - 97 fL   MCH 26.8 26.6 - 33.0 pg   MCHC 32.6 31 - 35 g/dL   RDW 14.1 11.6 - 15.4 %   Platelets 414 150 - 450 x10E3/uL   Neutrophils 68 Not Estab. %   Lymphs 25 Not Estab. %   Monocytes 4 Not Estab. %   Eos 3 Not Estab. %   Basos 0 Not Estab. %   Neutrophils Absolute 11.1 (H) 1.40 - 7.00 x10E3/uL   Lymphocytes Absolute 4.1 (H) 0 - 3 x10E3/uL   Monocytes Absolute 0.7 0 - 0 x10E3/uL   EOS (ABSOLUTE) 0.5 (H) 0.0 - 0.4 x10E3/uL   Basophils Absolute 0.1 0 - 0 x10E3/uL   Immature Granulocytes 0 Not Estab. %   Immature Grans (Abs) 0.0 0.0 - 0.1 x10E3/uL  HgB A1c     Status: None   Collection Time: 05/05/20 10:38 AM  Result Value Ref Range   Hemoglobin A1C     HbA1c POC (<> result, manual entry)     HbA1c, POC (prediabetic range)     HbA1c, POC (controlled diabetic range) 6.8 0.0 - 7.0 %     Assessment & Plan:   1.  Type 2 diabetes -new diagnosis  -His recent point-of-care A1c 6.8% , progressively increasing from 5.9%.  He has benefited from metformin therapy.  He is advised to continue Metformin 500 mg p.o. twice daily.   - he  admits there is a room for improvement in his diet and drink choices. -  Suggestion is made for him to avoid simple carbohydrates  from his diet including Cakes, Sweet Desserts / Pastries, Ice Cream, Soda (diet and regular), Sweet Tea, Candies, Chips, Cookies, Sweet  Pastries,  Store Bought Juices, Alcohol in Excess of  1-2 drinks a day, Artificial Sweeteners, Coffee Creamer, and "Sugar-free" Products. This will help patient to have stable blood glucose profile and potentially avoid unintended weight gain.   His BMI 59.4-his measurement medical concern.  2. Hypogonadism  - Etiology most likely multifactorial including chromosomal abnormality and history of undescended testes.  - He has required testosterone supplement for several years  now. I have reviewed his EMR records and found out that he had low testosterone starting from at least 2012.  -I have not seen FSH/LH levels, would have  been useful prior to initiation of testosterone therapy however the utility of that test now is unremarkable. 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  He wishes to be continued on testosterone supplement.  He is advised on consistency, continue testosterone 100 mg IM every 7 days.  His previsit total testosterone is 115 dropping from 614 last visit.    Therapeutic goal is to keep his total testosterone above 250.  -He will need repeat testosterone measurement along with CBC during his next visit.   3. Undescended right testicle - He is status post orchidopexy of right testicle. The details of his surgical history are not available for review.  4) hypertension -His blood pressure is improving after he was initiated on clonidine.  I discussed and increase his clonidine to 0.2 mg p.o. twice daily along with his enalapril, chlorthalidone, and metoprolol.  5) hyperlipidemia  -He has responded to the low-dose Crestor.  His previsit labs show LDL 109 improving from 145.  He is advised to continue Crestor 5 mg p.o. nightly .  He is advised to continue close follow-up with his PMD Dr. Legrand Rams.      - Time spent on this patient care encounter:  20 minutes of which 50% was spent in  counseling and the rest reviewing  his current and  previous labs / studies and  medications  doses and developing a plan for long term care. Allen Harding  participated in the discussions, expressed understanding, and voiced agreement with the above plans.  All questions were answered to his satisfaction. he is encouraged to contact clinic should he have any questions or concerns prior to his return visit.    Follow up plan: Return in about 4 months (around 09/03/2020) for F/U with Pre-visit Labs, Fasting Labs  in AM B4 8, A1c -NV.  Glade Lloyd, MD Phone: [phone]  Fax: [phone]   This note was partially dictated with voice recognition software. Similar sounding words can be transcribed inadequately or may not  be corrected upon review.  05/05/2020, 12:39 PM

## 2020-07-17 ENCOUNTER — Other Ambulatory Visit: Payer: Self-pay | Admitting: "Endocrinology

## 2020-07-17 DIAGNOSIS — E291 Testicular hypofunction: Secondary | ICD-10-CM

## 2020-08-31 LAB — COMPREHENSIVE METABOLIC PANEL
ALT: 34 IU/L (ref 0–44)
AST: 23 IU/L (ref 0–40)
Albumin/Globulin Ratio: 1 — ABNORMAL LOW (ref 1.2–2.2)
Albumin: 3.8 g/dL — ABNORMAL LOW (ref 4.1–5.2)
Alkaline Phosphatase: 89 IU/L (ref 44–121)
BUN/Creatinine Ratio: 10 (ref 9–20)
BUN: 8 mg/dL (ref 6–20)
Bilirubin Total: 0.3 mg/dL (ref 0.0–1.2)
CO2: 24 mmol/L (ref 20–29)
Calcium: 9.1 mg/dL (ref 8.7–10.2)
Chloride: 101 mmol/L (ref 96–106)
Creatinine, Ser: 0.79 mg/dL (ref 0.76–1.27)
Globulin, Total: 3.8 g/dL (ref 1.5–4.5)
Glucose: 113 mg/dL — ABNORMAL HIGH (ref 65–99)
Potassium: 4 mmol/L (ref 3.5–5.2)
Sodium: 140 mmol/L (ref 134–144)
Total Protein: 7.6 g/dL (ref 6.0–8.5)
eGFR: 126 mL/min/{1.73_m2} (ref >59)

## 2020-08-31 LAB — TESTOSTERONE, FREE, TOTAL, SHBG
Sex Hormone Binding: 17.1 nmol/L (ref 16.5–55.9)
Testosterone, Free: 12.7 pg/mL (ref 9.3–26.5)
Testosterone: 391 ng/dL (ref 264–916)

## 2020-08-31 LAB — LIPID PANEL
Chol/HDL Ratio: 4.8 ratio (ref 0.0–5.0)
Cholesterol, Total: 181 mg/dL (ref 100–199)
HDL: 38 mg/dL — ABNORMAL LOW (ref >39)
LDL Chol Calc (NIH): 120 mg/dL — ABNORMAL HIGH (ref 0–99)
Triglycerides: 125 mg/dL (ref 0–149)
VLDL Cholesterol Cal: 23 mg/dL (ref 5–40)

## 2020-09-04 ENCOUNTER — Ambulatory Visit (INDEPENDENT_AMBULATORY_CARE_PROVIDER_SITE_OTHER): Payer: Medicaid Other | Admitting: "Endocrinology

## 2020-09-04 ENCOUNTER — Other Ambulatory Visit: Payer: Self-pay

## 2020-09-04 ENCOUNTER — Encounter: Payer: Self-pay | Admitting: "Endocrinology

## 2020-09-04 VITALS — BP 127/82 | HR 84 | Ht 71.0 in | Wt >= 6400 oz

## 2020-09-04 DIAGNOSIS — E291 Testicular hypofunction: Secondary | ICD-10-CM | POA: Diagnosis not present

## 2020-09-04 DIAGNOSIS — I10 Essential (primary) hypertension: Secondary | ICD-10-CM

## 2020-09-04 DIAGNOSIS — E119 Type 2 diabetes mellitus without complications: Secondary | ICD-10-CM | POA: Diagnosis not present

## 2020-09-04 DIAGNOSIS — E782 Mixed hyperlipidemia: Secondary | ICD-10-CM

## 2020-09-04 LAB — POCT GLYCOSYLATED HEMOGLOBIN (HGB A1C): HbA1c, POC (controlled diabetic range): 6.3 % (ref 0.0–7.0)

## 2020-09-04 NOTE — Progress Notes (Signed)
09/04/2020      Endocrinology follow-up note    Subjective:    Patient ID: Allen Harding, male    DOB: 22-Jul-1995, PCP Rosita Fire, MD   Past Medical History:  Diagnosis Date  . ADHD (attention deficit hyperactivity disorder)   . Anxiety   . Autism disorder   . 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 Diabetes mellitus   . Hypertension   . Prediabetes    Past Surgical History:  Procedure Laterality Date  . CIRCUMCISION    . CIRCUMCISION REVISION    . FRENULECTOMY, LINGUAL    . lipoma    . ORCHIOPEXY     Social History   Socioeconomic History  . Marital status: Single    Spouse name: Not on file  . Number of children: Not on file  . Years of education: Not on file  . Highest education level: Not on file  Occupational History  . Not on file  Tobacco Use  . Smoking status: Former Smoker    Packs/day: 0.25    Quit date: 01/09/2014    Years since quitting: 6.6  . Smokeless tobacco: Never Used  Vaping Use  . Vaping Use: Never used  Substance and Sexual Activity  . Alcohol use: No  . Drug use: No  . Sexual activity: Not on file  Other Topics Concern  . Not on file  Social History Narrative   Lives with mom, sister, 2 nieces, grandparents and sister's fiance.    Social Determinants of Health   Financial Resource Strain: Not on file  Food Insecurity: Not on file  Transportation Needs: Not on file  Physical Activity: Not on file  Stress: Not on file  Social Connections: Not on file   Outpatient Encounter Medications as of 09/04/2020  Medication Sig  . cetirizine (ZYRTEC) 10 MG tablet TAKE ONE TABLET BY MOUTH DAILY.  . chlorthalidone (HYGROTON) 25 MG tablet TAKE 1 TABLETS BY MOUTH DAILY.  . cloNIDine (CATAPRES) 0.2 MG tablet Take 1 tablet (0.2 mg total) by mouth 2 (two) times daily.  . clotrimazole-betamethasone (LOTRISONE) cream Apply topically 2 (two) times daily.  . enalapril (VASOTEC) 20 MG tablet TAKE (1) TABLET BY MOUTH ONCE  DAILY.  . ferrous sulfate 325 (65 FE) MG tablet Take by mouth.  . letrozole (FEMARA) 2.5 MG tablet Take 1 tablet (2.5 mg total) by mouth daily.  . metFORMIN (GLUCOPHAGE) 500 MG tablet TAKE 1 TABLET BY MOUTH TWICE DAILY WITH MEALS.  Marland Kitchen Metoprolol Tartrate 75 MG TABS Take 75 mg by mouth 2 (two) times daily.  Marland Kitchen omeprazole (PRILOSEC) 40 MG capsule Take 1 capsule (40 mg total) by mouth 2 (two) times daily.  Marland Kitchen oxybutynin (DITROPAN-XL) 10 MG 24 hr tablet TAKE 1 TABLET BY MOUTH DAILY.  . rosuvastatin (CRESTOR) 5 MG tablet Take 1 tablet (5 mg total) by mouth daily.  . SYRINGE-NEEDLE, DISP, 3 ML (BD ECLIPSE SYRINGE) 21G X 1" 3 ML MISC USE TO INJECT TESTOSTERONE WEEKLY  . testosterone cypionate (DEPOTESTOSTERONE CYPIONATE) 200 MG/ML injection INJECT 0.5ML INTRAMUSCULARLY EVERY SEVEN DAYS.  Marland Kitchen traZODone (DESYREL) 100 MG tablet Take 50 mg by mouth at bedtime. Reported on 06/16/2015  . Vitamin D, Ergocalciferol, (DRISDOL) 1.25 MG (50000 UNIT) CAPS capsule Take 1 capsule (50,000 Units total) by mouth every 7 (seven) days.   No facility-administered encounter medications on file as of 09/04/2020.   ALLERGIES: No Known Allergies VACCINATION STATUS: Immunization History  Administered Date(s) Administered  . DTaP 06/11/1995,  08/06/1995, 09/24/1995, 06/30/1996, 09/24/1999  . H1N1 11/24/2008  . HPV Quadrivalent 02/08/2014  . Hepatitis A, Ped/Adol-2 Dose 02/03/2013, 08/19/2013  . Hepatitis B 1994/12/12, 05/07/1995, 09/24/1995  . HiB (PRP-OMP) 06/11/1995, 08/06/1995, 09/24/1995, 06/30/1996  . IPV 06/11/1995, 08/06/1995, 09/24/1995, 09/24/1999  . Influenza Whole 03/22/2009, 06/17/2011, 07/23/2012  . Influenza,inj,Quad PF,6+ Mos 05/02/2015, 03/30/2019, 03/30/2020  . MMR 03/31/1996, 09/24/1999  . Meningococcal Conjugate 11/24/2008, 08/19/2013  . Td 01/07/2006  . Tdap 01/07/2006  . Varicella 03/31/1996, 06/01/1997    Diabetes   25 year old male patient with medical history as above. 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  - He has been following at [facility] until March 2017 with Dr. Baldo Ash. -He does not know for sure when he was started on testosterone therapy.  Since last visit, he was consistent taking his testosterone 100 mg IM every 7 days.  His previsit labs show total testosterone corrected at 391 from 115.     -He has dealt with heavy weight almost all of his life, admits to dietary indiscretions including consumption of large quantities of sweetened beverages.  His point-of-care A1c today is 6.3% improving from 6.8%.   -He is currently on metformin 500 mg p.o. twice daily.   -He was recently initiated on Crestor due to significant dyslipidemia.  His previsit lipid panel showed LDL improved to 109 from 145.    -He underwent orchidopexy for right-sided undescended testes. -He denies testicular injury, radiation, infection, STD. -He denies any history of head injury.    Objective:    BP 127/82   Pulse 84   Ht '5\' 11"'  (1.803 m)   Wt (!) 430 lb 6.4 oz (195.2 kg)   BMI 60.03 kg/m   Wt Readings from Last 3 Encounters:  09/04/20 (!) 430 lb 6.4 oz (195.2 kg)  03/30/20 (!) 428 lb 12.7 oz (194.5 kg)  03/10/20 (!) 430 lb 12.8 oz (195.4 kg)      CMP     Component Value Date/Time   NA 140 08/29/2020 1304   K 4.0 08/29/2020 1304   CL 101 08/29/2020 1304   CO2 24 08/29/2020 1304   GLUCOSE 113 (H) 08/29/2020 1304   GLUCOSE 108 (H) 03/22/2020 1320   BUN 8 08/29/2020 1304   CREATININE 0.79 08/29/2020 1304   CREATININE 0.63 01/26/2020 1047   CALCIUM 9.1 08/29/2020 1304   PROT 7.6 08/29/2020 1304   ALBUMIN 3.8 (L) 08/29/2020 1304   AST 23 08/29/2020 1304   ALT 34 08/29/2020 1304   ALKPHOS 89 08/29/2020 1304   BILITOT 0.3 08/29/2020 1304   GFRNONAA >60 03/22/2020 1320   GFRNONAA 138 01/26/2020 1047   GFRAA 160  01/26/2020 1047   Diabetic Labs (most recent): Lab Results  Component Value Date   HGBA1C 6.3 09/04/2020   HGBA1C 6.8 05/05/2020   HGBA1C 6.3 (A) 09/23/2019    Lipid Panel     Component Value Date/Time   CHOL 181 08/29/2020 1304   TRIG 125 08/29/2020 1304   HDL 38 (L) 08/29/2020 1304   CHOLHDL 4.8 08/29/2020 1304   CHOLHDL 3.9 01/26/2020 1047   VLDL 21 08/08/2015 1152   LDLCALC 120 (H) 08/29/2020 1304   LDLCALC 109 (H) 01/26/2020 1047   Recent Results (from the past 2160 hour(s))  Comprehensive metabolic panel     Status: Abnormal   Collection Time: 08/29/20  1:04 PM  Result Value Ref Range   Glucose 113 (  H) 65 - 99 mg/dL   BUN 8 6 - 20 mg/dL   Creatinine, Ser 0.79 0.76 - 1.27 mg/dL   eGFR 126 >59 mL/min/1.73   BUN/Creatinine Ratio 10 9 - 20   Sodium 140 134 - 144 mmol/L   Potassium 4.0 3.5 - 5.2 mmol/L   Chloride 101 96 - 106 mmol/L   CO2 24 20 - 29 mmol/L   Calcium 9.1 8.7 - 10.2 mg/dL   Total Protein 7.6 6.0 - 8.5 g/dL   Albumin 3.8 (L) 4.1 - 5.2 g/dL   Globulin, Total 3.8 1.5 - 4.5 g/dL   Albumin/Globulin Ratio 1.0 (L) 1.2 - 2.2   Bilirubin Total 0.3 0.0 - 1.2 mg/dL   Alkaline Phosphatase 89 44 - 121 IU/L   AST 23 0 - 40 IU/L   ALT 34 0 - 44 IU/L  Testosterone, Free, Total, SHBG     Status: None   Collection Time: 08/29/20  1:04 PM  Result Value Ref Range   Testosterone 391 264 - 916 ng/dL    Comment: Adult male reference interval is based on a population of healthy nonobese males (BMI <30) between 59 and 26 years old. [place], [place] [phone]. PMID: [id number].    Testosterone, Free 12.7 9.3 - 26.5 pg/mL   Sex Hormone Binding 17.1 16.5 - 55.9 nmol/L  Lipid panel     Status: Abnormal   Collection Time: 08/29/20  1:04 PM  Result Value Ref Range   Cholesterol, Total 181 100 - 199 mg/dL   Triglycerides 125 0 - 149 mg/dL   HDL 38 (L) >39 mg/dL   VLDL Cholesterol Cal 23 5 - 40 mg/dL   LDL Chol Calc (NIH) 120 (H) 0 - 99 mg/dL   Chol/HDL Ratio  4.8 0.0 - 5.0 ratio    Comment:                                   T. Chol/HDL Ratio                                             Men  Women                               1/2 Avg.Risk  3.4    3.3                                   Avg.Risk  5.0    4.4                                2X Avg.Risk  9.6    7.1                                3X Avg.Risk 23.4   11.0   HgB A1c     Status: None   Collection Time: 09/04/20 11:12 AM  Result Value Ref Range   Hemoglobin A1C     HbA1c POC (<> result, manual entry)     HbA1c, POC (prediabetic range)     HbA1c, POC (  controlled diabetic range) 6.3 0.0 - 7.0 %     Assessment & Plan:   1.  Type 2 diabetes -new diagnosis  -His point-of-care A1c 6.3%, improving from 6.8%.  He will continue to benefit from Metformin intervention, advised to continue  Metformin 500 mg p.o. twice daily.    - he acknowledges that there is a room for improvement in his food and drink choices. - Suggestion is made for him to avoid simple carbohydrates  from his diet including Cakes, Sweet Desserts, Ice Cream, Soda (diet and regular), Sweet Tea, Candies, Chips, Cookies, Store Bought Juices, Alcohol in Excess of  1-2 drinks a day, Artificial Sweeteners,  Coffee Creamer, and "Sugar-free" Products, Lemonade. This will help patient to have more stable blood glucose profile and potentially avoid unintended weight gain.    His BMI 60.03 -his major medical problem.    2. Hypogonadism  - Etiology most likely multifactorial including chromosomal abnormality and history of undescended testes.  - He has required testosterone supplement for several years now. I have reviewed his EMR records and found out that he had low testosterone starting from at least 2012.  -I have not seen FSH/LH levels, would have  been useful prior to initiation of testosterone therapy however the utility of that test now is unremarkable. 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  He wishes to be continued on  testosterone supplement.  He is advised to continue  testosterone 100 mg IM every 7 days.  His previsit total testosterone is acceptable at 391 improving from 115 .   Therapeutic goal is to keep his total testosterone above 250.  -He will need repeat testosterone measurement along with CBC during his next visit.   3. Undescended right testicle - He is status post orchidopexy of right testicle. The details of his surgical history are not available for review.  4) hypertension -His blood pressure is controlled to target.  He is advised to continue clonidine 0.2 mg p.o. twice daily along with his enalapril, chlorthalidone and metoprolol.    5) hyperlipidemia  -He has responded to the low-dose Crestor.  His previsit labs show LDL reversing to 120 from 109.  He is advised to be consistent on his Crestor 5 mg p.o. nightly.  He will be considered for higher dose of Crestor during his next visit.    He is advised to continue close follow-up with his PMD Dr. Legrand Rams.      - Time spent on this patient care encounter:  30 minutes of which 50% was spent in  counseling and the rest reviewing  his current and  previous labs / studies and medications  doses and developing a plan for long term care, and documenting this care. Allen Harding  participated in the discussions, expressed understanding, and voiced agreement with the above plans.  All questions were answered to his satisfaction. he is encouraged to contact clinic should he have any questions or concerns prior to his return visit.     Follow up plan: Return in about 4 months (around 01/09/2021) for F/U with Pre-visit Labs.  Glade Lloyd, MD Phone: [phone]  Fax: [phone]   This note was partially dictated with voice recognition software. Similar sounding words can be transcribed inadequately or may not  be corrected upon review.  09/04/2020, 12:21 PM

## 2020-09-04 NOTE — Patient Instructions (Signed)

## 2021-01-10 ENCOUNTER — Ambulatory Visit: Payer: Medicaid Other | Admitting: "Endocrinology

## 2021-01-11 ENCOUNTER — Other Ambulatory Visit: Payer: Self-pay | Admitting: "Endocrinology

## 2021-01-11 DIAGNOSIS — E291 Testicular hypofunction: Secondary | ICD-10-CM

## 2021-01-15 ENCOUNTER — Other Ambulatory Visit: Payer: Self-pay | Admitting: "Endocrinology

## 2021-01-15 ENCOUNTER — Telehealth: Payer: Self-pay | Admitting: "Endocrinology

## 2021-01-15 DIAGNOSIS — E291 Testicular hypofunction: Secondary | ICD-10-CM

## 2021-01-15 MED ORDER — TESTOSTERONE CYPIONATE 200 MG/ML IM SOLN: INTRAMUSCULAR | 0 refills | Status: DC

## 2021-01-15 NOTE — Telephone Encounter (Signed)
Pt mom calling and needs the medication to be resent as pharmacy did not receive it.  testosterone cypionate (DEPOTESTOSTERONE CYPIONATE) 200 MG/ML injection    [facility] - [place], [place] - [address] Phone:  [phone]  Fax:  [phone]

## 2021-01-15 NOTE — Telephone Encounter (Signed)
Rx sent 

## 2021-01-20 LAB — CBC WITH DIFFERENTIAL/PLATELET
Basophils Absolute: 0.1 10*3/uL (ref 0.0–0.2)
Basos: 0 %
EOS (ABSOLUTE): 0.3 10*3/uL (ref 0.0–0.4)
Eos: 2 %
Hematocrit: 41.5 % (ref 37.5–51.0)
Hemoglobin: 13.6 g/dL (ref 13.0–17.7)
Immature Grans (Abs): 0.1 10*3/uL (ref 0.0–0.1)
Immature Granulocytes: 1 %
Lymphocytes Absolute: 4.2 10*3/uL — ABNORMAL HIGH (ref 0.7–3.1)
Lymphs: 29 %
MCH: 26.4 pg — ABNORMAL LOW (ref 26.6–33.0)
MCHC: 32.8 g/dL (ref 31.5–35.7)
MCV: 81 fL (ref 79–97)
Monocytes Absolute: 0.8 10*3/uL (ref 0.1–0.9)
Monocytes: 6 %
Neutrophils Absolute: 9 10*3/uL — ABNORMAL HIGH (ref 1.4–7.0)
Neutrophils: 62 %
Platelets: 379 10*3/uL (ref 150–450)
RBC: 5.15 x10E6/uL (ref 4.14–5.80)
RDW: 13.7 % (ref 11.6–15.4)
WBC: 14.6 10*3/uL — ABNORMAL HIGH (ref 3.4–10.8)

## 2021-01-20 LAB — TESTOSTERONE, FREE, TOTAL, SHBG
Sex Hormone Binding: 17 nmol/L (ref 16.5–55.9)
Testosterone, Free: 6.9 pg/mL — ABNORMAL LOW (ref 9.3–26.5)
Testosterone: 126 ng/dL — ABNORMAL LOW (ref 264–916)

## 2021-01-23 ENCOUNTER — Encounter: Payer: Self-pay | Admitting: "Endocrinology

## 2021-01-23 ENCOUNTER — Ambulatory Visit (INDEPENDENT_AMBULATORY_CARE_PROVIDER_SITE_OTHER): Payer: Medicaid Other | Admitting: "Endocrinology

## 2021-01-23 ENCOUNTER — Other Ambulatory Visit: Payer: Self-pay

## 2021-01-23 VITALS — BP 136/78 | HR 88 | Ht 71.0 in | Wt >= 6400 oz

## 2021-01-23 DIAGNOSIS — I10 Essential (primary) hypertension: Secondary | ICD-10-CM | POA: Diagnosis not present

## 2021-01-23 DIAGNOSIS — E119 Type 2 diabetes mellitus without complications: Secondary | ICD-10-CM

## 2021-01-23 DIAGNOSIS — E291 Testicular hypofunction: Secondary | ICD-10-CM | POA: Diagnosis not present

## 2021-01-23 DIAGNOSIS — E782 Mixed hyperlipidemia: Secondary | ICD-10-CM | POA: Diagnosis not present

## 2021-01-23 LAB — POCT GLYCOSYLATED HEMOGLOBIN (HGB A1C): HbA1c, POC (controlled diabetic range): 6.1 % (ref 0.0–7.0)

## 2021-01-23 NOTE — Patient Instructions (Signed)

## 2021-01-23 NOTE — Progress Notes (Signed)
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   Diabetes mellitus    Hypertension    Prediabetes    Past Surgical History:  Procedure Laterality Date   CIRCUMCISION     CIRCUMCISION REVISION     FRENULECTOMY, LINGUAL     lipoma     ORCHIOPEXY     Social History   Socioeconomic History   Marital status: Single    Spouse name: Not on file   Number of children: Not on file   Years of education: Not on file   Highest education level: Not on file  Occupational History   Not on file  Tobacco Use   Smoking status: Former    Packs/day: 0.25    Types: Cigarettes    Quit date: 01/09/2014    Years since quitting: 7.0   Smokeless tobacco: Never  Vaping Use   Vaping Use: Never used  Substance and Sexual Activity   Alcohol use: No   Drug use: No   Sexual activity: Not on file  Other Topics Concern   Not on file  Social History Narrative   Lives with mom, sister, 2 nieces, grandparents and sister's fiance.    Social Determinants of Health   Financial Resource Strain: Not on file  Food Insecurity: Not on file  Transportation Needs: Not on file  Physical Activity: Not on file  Stress: Not on file  Social Connections: Not on file   Outpatient Encounter Medications as of 01/23/2021  Medication Sig   cetirizine (ZYRTEC) 10 MG tablet TAKE ONE TABLET BY MOUTH DAILY.   chlorthalidone (HYGROTON) 25 MG tablet TAKE 1 TABLETS BY MOUTH DAILY.   cloNIDine (CATAPRES) 0.2 MG tablet Take 1 tablet (0.2 mg total) by mouth 2 (two) times daily.   clotrimazole-betamethasone (LOTRISONE) cream Apply topically 2 (two) times daily.   enalapril (VASOTEC) 20 MG tablet TAKE (1) TABLET BY MOUTH ONCE DAILY.   ferrous  sulfate 325 (65 FE) MG tablet Take by mouth.   letrozole (FEMARA) 2.5 MG tablet Take 1 tablet (2.5 mg total) by mouth daily.   metFORMIN (GLUCOPHAGE) 500 MG tablet TAKE 1 TABLET BY MOUTH TWICE DAILY WITH MEALS.   Metoprolol Tartrate 75 MG TABS Take 75 mg by mouth 2 (two) times daily.   omeprazole (PRILOSEC) 40 MG capsule Take 1 capsule (40 mg total) by mouth 2 (two) times daily.   oxybutynin (DITROPAN-XL) 10 MG 24 hr tablet TAKE 1 TABLET BY MOUTH DAILY.   rosuvastatin (CRESTOR) 5 MG tablet Take 1 tablet (5 mg total) by mouth daily.   SYRINGE-NEEDLE, DISP, 3 ML (BD ECLIPSE SYRINGE) 21G X 1" 3 ML MISC USE TO INJECT TESTOSTERONE WEEKLY   testosterone cypionate (DEPOTESTOSTERONE CYPIONATE) 200 MG/ML injection INJECT 0.5ML INTRAMUSCULARLY EVERY SEVEN DAYS.   traZODone (DESYREL) 100 MG tablet Take 50 mg by mouth at bedtime. Reported on 06/16/2015   Vitamin D, Ergocalciferol, (DRISDOL) 1.25 MG (50000 UNIT) CAPS capsule Take 1 capsule (50,000 Units total) by mouth every 7 (seven) days.   No facility-administered encounter medications on file as of 01/23/2021.   ALLERGIES: No Known Allergies VACCINATION STATUS: Immunization History  Administered Date(s) Administered  DTaP 06/11/1995, 08/06/1995, 09/24/1995, 06/30/1996, 09/24/1999   H1N1 11/24/2008   HPV Quadrivalent 02/08/2014   Hepatitis A, Ped/Adol-2 Dose 02/03/2013, 08/19/2013   Hepatitis B 03-15-95, 05/07/1995, 09/24/1995   HiB (PRP-OMP) 06/11/1995, 08/06/1995, 09/24/1995, 06/30/1996   IPV 06/11/1995, 08/06/1995, 09/24/1995, 09/24/1999   Influenza Whole 03/22/2009, 06/17/2011, 07/23/2012   Influenza,inj,Quad PF,6+ Mos 05/02/2015, 03/30/2019, 03/30/2020   MMR 03/31/1996, 09/24/1999   Meningococcal Conjugate 11/24/2008, 08/19/2013   Td 01/07/2006   Tdap 01/07/2006   Varicella 03/31/1996, 06/01/1997    Diabetes  25 year old male patient with medical history as above. 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  - He has been following at [facility] until March 2017 with Dr. Baldo Ash. -He does not know for sure when he was started on testosterone therapy.   -Admittedly, he has not been consistent taking his testosterone in the last several weeks.  He is previsit total testosterone has dropped to 126 from 391.  He reports fatigue.   -He has dealt with heavy weight almost all of his life, admits to dietary indiscretions including consumption of large quantities of sweetened beverages.  His point-of-care A1c today is 6.1% improving from 6.8%.   -He is currently on metformin 500 mg p.o. twice daily.   -He was recently initiated on Crestor due to significant dyslipidemia.  His previsit lipid panel showed LDL improved to 109 from 145.    -He underwent orchidopexy for right-sided undescended testes. -He denies testicular injury, radiation, infection, STD. -He denies any history of head injury.    Objective:    BP 136/78   Pulse 88   Ht '5\' 11"'  (1.803 m)   Wt (!) 426 lb (193.2 kg)   BMI 59.41 kg/m   Wt Readings from Last 3 Encounters:  01/23/21 (!) 426 lb (193.2 kg)  09/04/20 (!) 430 lb 6.4 oz (195.2 kg)  03/30/20 (!) 428 lb 12.7 oz (194.5 kg)      CMP     Component Value Date/Time   NA 140 08/29/2020 1304   K 4.0 08/29/2020 1304   CL 101 08/29/2020 1304   CO2 24 08/29/2020 1304   GLUCOSE 113 (H) 08/29/2020 1304   GLUCOSE 108 (H) 03/22/2020 1320   BUN 8 08/29/2020 1304   CREATININE 0.79 08/29/2020 1304   CREATININE 0.63 01/26/2020 1047   CALCIUM 9.1 08/29/2020 1304   PROT 7.6 08/29/2020 1304   ALBUMIN 3.8 (L) 08/29/2020 1304   AST 23 08/29/2020 1304   ALT 34 08/29/2020 1304   ALKPHOS 89 08/29/2020 1304   BILITOT 0.3 08/29/2020 1304   GFRNONAA >60 03/22/2020 1320   GFRNONAA 138 01/26/2020 1047   GFRAA 160 01/26/2020 1047   Diabetic Labs (most  recent): Lab Results  Component Value Date   HGBA1C 6.1 01/23/2021   HGBA1C 6.3 09/04/2020   HGBA1C 6.8 05/05/2020    Lipid Panel     Component Value Date/Time   CHOL 181 08/29/2020 1304   TRIG 125 08/29/2020 1304   HDL 38 (L) 08/29/2020 1304   CHOLHDL 4.8 08/29/2020 1304   CHOLHDL 3.9 01/26/2020 1047   VLDL 21 08/08/2015 1152   LDLCALC 120 (H) 08/29/2020 1304   LDLCALC 109 (H) 01/26/2020 1047   Recent Results (from the past 2160 hour(s))  Testosterone, Free, Total, SHBG     Status: Abnormal   Collection Time: 01/17/21  9:30 AM  Result Value Ref Range   Testosterone 126 (L)  264 - 916 ng/dL    Comment: Adult male reference interval is based on a population of healthy nonobese males (BMI <30) between 36 and 37 years old. [place], [place] [phone]. PMID: [id number].    Testosterone, Free 6.9 (L) 9.3 - 26.5 pg/mL   Sex Hormone Binding 17.0 16.5 - 55.9 nmol/L  CBC with Differential/Platelet     Status: Abnormal   Collection Time: 01/17/21  9:30 AM  Result Value Ref Range   WBC 14.6 (H) 3.4 - 10.8 x10E3/uL   RBC 5.15 4.14 - 5.80 x10E6/uL   Hemoglobin 13.6 13.0 - 17.7 g/dL   Hematocrit 41.5 37.5 - 51.0 %   MCV 81 79 - 97 fL   MCH 26.4 (L) 26.6 - 33.0 pg   MCHC 32.8 31.5 - 35.7 g/dL   RDW 13.7 11.6 - 15.4 %   Platelets 379 150 - 450 x10E3/uL   Neutrophils 62 Not Estab. %   Lymphs 29 Not Estab. %   Monocytes 6 Not Estab. %   Eos 2 Not Estab. %   Basos 0 Not Estab. %   Neutrophils Absolute 9.0 (H) 1.4 - 7.0 x10E3/uL   Lymphocytes Absolute 4.2 (H) 0.7 - 3.1 x10E3/uL   Monocytes Absolute 0.8 0.1 - 0.9 x10E3/uL   EOS (ABSOLUTE) 0.3 0.0 - 0.4 x10E3/uL   Basophils Absolute 0.1 0.0 - 0.2 x10E3/uL   Immature Granulocytes 1 Not Estab. %   Immature Grans (Abs) 0.1 0.0 - 0.1 x10E3/uL  HgB A1c     Status: None   Collection Time: 01/23/21  3:08 PM  Result Value Ref Range   Hemoglobin A1C     HbA1c POC (<> result, manual entry)     HbA1c, POC (prediabetic range)      HbA1c, POC (controlled diabetic range) 6.1 0.0 - 7.0 %     Assessment & Plan:   1.  Type 2 diabetes -new diagnosis  -His point-of-care A1c of 6.1%, overall improving from 6.8%.  He will continue to benefit from low-dose metformin intervention, advised to continue metformin 500 mg p.o. twice daily.    - he acknowledges that there is a room for improvement in his food and drink choices. - Suggestion is made for him to avoid simple carbohydrates  from his diet including Cakes, Sweet Desserts, Ice Cream, Soda (diet and regular), Sweet Tea, Candies, Chips, Cookies, Store Bought Juices, Alcohol in Excess of  1-2 drinks a day, Artificial Sweeteners,  Coffee Creamer, and "Sugar-free" Products, Lemonade. This will help patient to have more stable blood glucose profile and potentially avoid unintended weight gain.    His BMI 59.4- -his major medical problem.    2. Hypogonadism  - Etiology most likely multifactorial including chromosomal abnormality and history of undescended testes.  - He has required testosterone supplement for several years now. I have reviewed his EMR records and found out that he had low testosterone starting from at least 2012.  -I have not seen FSH/LH levels, would have  been useful prior to initiation of testosterone therapy however the utility of that test now is unremarkable. 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  He wishes to be continued on testosterone supplement.  He is advised to resume and continue   testosterone 100 mg IM every 7 days.  His previsit total testosterone is dropping to 126 from 391.     Therapeutic goal is to keep his total testosterone above 250.  -He will need repeat testosterone measurement along with CBC during his next visit.  3. Undescended right testicle - He is status post orchidopexy of right testicle. The details of his surgical history are not available for review.  4) hypertension -His blood pressure is controlled to target.   He is advised to continue clonidine 0.2 mg p.o. twice daily along with his enalapril, chlorthalidone and metoprolol.    5) hyperlipidemia  -He has responded to the low-dose Crestor.  His previsit labs show LDL reversing to 120 from 109.  He is advised to be consistent on his Crestor 5 mg p.o. nightly.  He will be considered for higher dose of Crestor during his next visit.    He is advised to continue close follow-up with his PMD Dr. Legrand Rams.    I spent 31 minutes in the care of the patient today including review of labs from Thyroid Function, CMP, and other relevant labs ; imaging/biopsy records (current and previous including abstractions from other facilities); face-to-face time discussing  his lab results and symptoms, medications doses, his options of short and long term treatment based on the latest standards of care / guidelines;   and documenting the encounter.  Allen Harding  participated in the discussions, expressed understanding, and voiced agreement with the above plans.  All questions were answered to his satisfaction. he is encouraged to contact clinic should he have any questions or concerns prior to his return visit.    Follow up plan: Return in about 4 months (around 05/25/2021) for Fasting Labs  in AM B4 8.  Glade Lloyd, MD Phone: [phone]  Fax: [phone]   This note was partially dictated with voice recognition software. Similar sounding words can be transcribed inadequately or may not  be corrected upon review.  01/23/2021, 3:46 PM

## 2021-03-27 ENCOUNTER — Other Ambulatory Visit: Payer: Self-pay

## 2021-03-27 ENCOUNTER — Ambulatory Visit: Payer: Self-pay

## 2021-03-27 ENCOUNTER — Ambulatory Visit
Admission: EM | Admit: 2021-03-27 | Discharge: 2021-03-27 | Disposition: A | Discharge status: Home / Self Care | Payer: Medicaid Other | Attending: Emergency Medicine | Admitting: Emergency Medicine

## 2021-03-27 ENCOUNTER — Encounter: Payer: Self-pay | Admitting: Emergency Medicine

## 2021-03-27 DIAGNOSIS — T2122XA Burn of second degree of abdominal wall, initial encounter: Secondary | ICD-10-CM

## 2021-03-27 DIAGNOSIS — I10 Essential (primary) hypertension: Secondary | ICD-10-CM

## 2021-03-27 MED ORDER — SILVER SULFADIAZINE 1 % EX CREA: 1.0000 "application " | TOPICAL_CREAM | Freq: Every day | CUTANEOUS | 0 refills | Status: DC

## 2021-03-27 MED ORDER — CEPHALEXIN 500 MG PO CAPS: 1000.0000 mg | ORAL_CAPSULE | Freq: Two times a day (BID) | ORAL | 0 refills | Status: AC

## 2021-03-27 NOTE — ED Triage Notes (Signed)
Burn to stomach from hot soup on Wednesday.  States area is itchy
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
## 2021-03-27 NOTE — Discharge Instructions (Addendum)
Silvadene once a day.  Keep it covered during the day.  Give it some dry time at night.  Finish the Keflex unless a provider tells you to stop.  I am trying to prevent an infection.   Call here with a blood pressure reading once you are more comfortable and at home.  If it stays elevated, contact your PCP.  It may be reasonable to double up on your medications for short period of time.  Please follow-up with your doctor in a day or 2 to make sure that your blood pressure normalizes.  Decrease your salt intake. diet and exercise will lower your blood pressure significantly. It is important to keep your blood pressure under good control, as having a elevated blood pressure for prolonged periods of time significantly increases your risk of stroke, heart attacks, kidney damage, eye damage, and other problems. Measure your blood pressure once a day, preferably at the same time every day. Keep a log of this and bring it to your next doctor's appointment.  Bring your blood pressure cuff as well.  Return immediately to the ER if you start having chest pain, headache, problems seeing, problems talking, problems walking, if you feel like you're about to pass out, if you do pass out, if you have a seizure, or for any other concerns.  Go to www.goodrx.com  or www.costplusdrugs.com to look up your medications. This will give you a list of where you can find your prescriptions at the most affordable prices. Or ask the pharmacist what the cash price is, or if they have any other discount programs available to help make your medication more affordable. This can be less expensive than what you would pay with insurance.

## 2021-04-04 ENCOUNTER — Other Ambulatory Visit (HOSPITAL_COMMUNITY): Payer: Self-pay

## 2021-04-04 DIAGNOSIS — D75839 Thrombocytosis, unspecified: Secondary | ICD-10-CM

## 2021-04-05 ENCOUNTER — Other Ambulatory Visit: Payer: Self-pay

## 2021-04-05 ENCOUNTER — Inpatient Hospital Stay (HOSPITAL_COMMUNITY): Payer: Medicaid Other | Attending: Hematology

## 2021-04-05 DIAGNOSIS — D72828 Other elevated white blood cell count: Secondary | ICD-10-CM | POA: Diagnosis not present

## 2021-04-05 DIAGNOSIS — D75839 Thrombocytosis, unspecified: Secondary | ICD-10-CM | POA: Diagnosis not present

## 2021-04-05 LAB — CBC WITH DIFFERENTIAL/PLATELET
Abs Immature Granulocytes: 0.07 10*3/uL (ref 0.00–0.07)
Basophils Absolute: 0.1 10*3/uL (ref 0.0–0.1)
Basophils Relative: 1 %
Eosinophils Absolute: 0.2 10*3/uL (ref 0.0–0.5)
Eosinophils Relative: 1 %
HCT: 43.6 % (ref 39.0–52.0)
Hemoglobin: 14.2 g/dL (ref 13.0–17.0)
Immature Granulocytes: 1 %
Lymphocytes Relative: 26 %
Lymphs Abs: 4 10*3/uL (ref 0.7–4.0)
MCH: 27.6 pg (ref 26.0–34.0)
MCHC: 32.6 g/dL (ref 30.0–36.0)
MCV: 84.7 fL (ref 80.0–100.0)
Monocytes Absolute: 0.9 10*3/uL (ref 0.1–1.0)
Monocytes Relative: 6 %
Neutro Abs: 10.2 10*3/uL — ABNORMAL HIGH (ref 1.7–7.7)
Neutrophils Relative %: 65 %
Platelets: 397 10*3/uL (ref 150–400)
RBC: 5.15 MIL/uL (ref 4.22–5.81)
RDW: 13.7 % (ref 11.5–15.5)
WBC: 15.5 10*3/uL — ABNORMAL HIGH (ref 4.0–10.5)
nRBC: 0 % (ref 0.0–0.2)

## 2021-04-05 LAB — LACTATE DEHYDROGENASE: LDH: 135 U/L (ref 98–192)
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
## 2021-04-12 ENCOUNTER — Inpatient Hospital Stay (HOSPITAL_BASED_OUTPATIENT_CLINIC_OR_DEPARTMENT_OTHER): Payer: Medicaid Other | Admitting: Hematology

## 2021-04-12 ENCOUNTER — Other Ambulatory Visit: Payer: Self-pay

## 2021-04-12 ENCOUNTER — Other Ambulatory Visit: Payer: Self-pay | Admitting: "Endocrinology

## 2021-04-12 DIAGNOSIS — D75839 Thrombocytosis, unspecified: Secondary | ICD-10-CM | POA: Diagnosis not present

## 2021-04-12 DIAGNOSIS — D72829 Elevated white blood cell count, unspecified: Secondary | ICD-10-CM

## 2021-04-12 DIAGNOSIS — I10 Essential (primary) hypertension: Secondary | ICD-10-CM

## 2021-04-12 NOTE — Patient Instructions (Signed)
[facility] at [facility] Discharge Instructions  You were seen and examined today by Dr. Ellin Saba. He reviewed your most recent labs and everything is stable. Please keep follow up in one year. If you have recurrent infections like pneumonia, urinary tract infection, skin infections, evening fevers daily,or night sweats please call us for sooner appointment.   Thank you for choosing [facility] at [facility] to provide your oncology and hematology care.  To afford each patient quality time with our provider, please arrive at least 15 minutes before your scheduled appointment time.   If you have a lab appointment with the Cancer Center please come in thru the Main Entrance and check in at the main information desk.  You need to re-schedule your appointment should you arrive 10 or more minutes late.  We strive to give you quality time with our providers, and arriving late affects you and other patients whose appointments are after yours.  Also, if you no show three or more times for appointments you may be dismissed from the clinic at the providers discretion.     Again, thank you for choosing [facility].  Our hope is that these requests will decrease the amount of time that you wait before being seen by our physicians.       _____________________________________________________________  Should you have questions after your visit to [facility], please contact our office at [phone] and follow the prompts.  Our office hours are 8:00 a.m. and 4:30 p.m. Monday - Friday.  Please note that voicemails left after 4:00 p.m. may not be returned until the following business day.  We are closed weekends and major holidays.  You do have access to a nurse 24-7, just call the main number to the clinic [phone] and do not press any options, hold on the line and a nurse will answer the phone.    For prescription refill requests,  have your pharmacy contact our office and allow 72 hours.    Due to Covid, you will need to wear a mask upon entering the hospital. If you do not have a mask, a mask will be given to you at the Main Entrance upon arrival. For doctor visits, patients may have 1 support person age 13 or older with them. For treatment visits, patients can not have anyone with them due to social distancing guidelines and our immunocompromised population.

## 2021-05-02 ENCOUNTER — Other Ambulatory Visit: Payer: Self-pay | Admitting: "Endocrinology

## 2021-05-02 DIAGNOSIS — E291 Testicular hypofunction: Secondary | ICD-10-CM

## 2021-06-06 ENCOUNTER — Ambulatory Visit: Payer: Medicaid Other | Admitting: "Endocrinology

## 2022-04-09 ENCOUNTER — Inpatient Hospital Stay: Payer: Medicaid Other | Attending: Hematology

## 2022-04-09 DIAGNOSIS — D72829 Elevated white blood cell count, unspecified: Secondary | ICD-10-CM

## 2022-04-09 DIAGNOSIS — D75839 Thrombocytosis, unspecified: Secondary | ICD-10-CM | POA: Insufficient documentation

## 2022-04-09 DIAGNOSIS — D72828 Other elevated white blood cell count: Secondary | ICD-10-CM | POA: Insufficient documentation

## 2022-04-09 LAB — CBC WITH DIFFERENTIAL/PLATELET
Abs Immature Granulocytes: 0.06 10*3/uL (ref 0.00–0.07)
Basophils Absolute: 0 10*3/uL (ref 0.0–0.1)
Basophils Relative: 0 %
Eosinophils Absolute: 0.3 10*3/uL (ref 0.0–0.5)
Eosinophils Relative: 2 %
HCT: 41.9 % (ref 39.0–52.0)
Hemoglobin: 13.4 g/dL (ref 13.0–17.0)
Immature Granulocytes: 0 %
Lymphocytes Relative: 26 %
Lymphs Abs: 3.6 10*3/uL (ref 0.7–4.0)
MCH: 26.1 pg (ref 26.0–34.0)
MCHC: 32 g/dL (ref 30.0–36.0)
MCV: 81.7 fL (ref 80.0–100.0)
Monocytes Absolute: 0.7 10*3/uL (ref 0.1–1.0)
Monocytes Relative: 5 %
Neutro Abs: 8.8 10*3/uL — ABNORMAL HIGH (ref 1.7–7.7)
Neutrophils Relative %: 67 %
Platelets: 383 10*3/uL (ref 150–400)
RBC: 5.13 MIL/uL (ref 4.22–5.81)
RDW: 14.2 % (ref 11.5–15.5)
WBC: 13.5 10*3/uL — ABNORMAL HIGH (ref 4.0–10.5)
nRBC: 0 % (ref 0.0–0.2)

## 2022-04-09 LAB — LACTATE DEHYDROGENASE: LDH: 139 U/L (ref 98–192)

## 2022-04-16 ENCOUNTER — Inpatient Hospital Stay (HOSPITAL_BASED_OUTPATIENT_CLINIC_OR_DEPARTMENT_OTHER): Payer: Medicaid Other | Admitting: Hematology

## 2022-04-16 VITALS — BP 218/114 | HR 130 | Temp 100.2°F | Resp 20 | Ht 70.0 in | Wt >= 6400 oz

## 2022-04-16 DIAGNOSIS — D75839 Thrombocytosis, unspecified: Secondary | ICD-10-CM

## 2022-04-16 DIAGNOSIS — D72828 Other elevated white blood cell count: Secondary | ICD-10-CM | POA: Diagnosis not present

## 2022-04-16 DIAGNOSIS — D72829 Elevated white blood cell count, unspecified: Secondary | ICD-10-CM

## 2022-04-16 NOTE — Patient Instructions (Signed)
[facility] - [facility]  Discharge Instructions  You were seen and examined today by Dr. Ellin Saba.  Dr. Ellin Saba discussed your most recent lab work which revealed that your counts are looking better but your white blood cells are still elevated.  Follow-up as scheduled in 1 year with labs.    Thank you for choosing [facility] to provide your oncology and hematology care.   To afford each patient quality time with our provider, please arrive at least 15 minutes before your scheduled appointment time. You may need to reschedule your appointment if you arrive late (10 or more minutes). Arriving late affects you and other patients whose appointments are after yours.  Also, if you miss three or more appointments without notifying the office, you may be dismissed from the clinic at the provider's discretion.    Again, thank you for choosing [facility].  Our hope is that these requests will decrease the amount of time that you wait before being seen by our physicians.   If you have a lab appointment with the Cancer Center please come in thru the Main Entrance and check in at the main information desk.           _____________________________________________________________  Should you have questions after your visit to [facility], please contact our office at [phone] and follow the prompts.  Our office hours are 8:00 a.m. to 4:30 p.m. Monday - Thursday and 8:00 a.m. to 2:30 p.m. Friday.  Please note that voicemails left after 4:00 p.m. may not be returned until the following business day.  We are closed weekends and all major holidays.  You do have access to a nurse 24-7, just call the main number to the clinic [phone] and do not press any options, hold on the line and a nurse will answer the phone.    For prescription refill requests, have your pharmacy contact our office and allow 72 hours.    Masks are  optional in the cancer centers. If you would like for your care team to wear a mask while they are taking care of you, please let them know. You may have one support person who is at least 27 years old accompany you for your appointments.

## 2022-04-16 NOTE — Progress Notes (Signed)
[address] [place], [place] [address]   CLINIC:  Medical Oncology/Hematology  PCP:  Carrolyn Meiers, MD [place] / [place] [address]  [phone]  REASON FOR VISIT:  Follow-up for neutrophilic leukocytosis and thrombocytosis  PRIOR THERAPY: none  CURRENT THERAPY: surveillance  INTERVAL HISTORY:  [name], a 26 y.o. male, seen for follow-up of leukocytosis.  Denies any fevers, night sweats or weight loss in the last 1 year.  Energy levels are 50%.  Dyspnea on exertion is stable.  Denies any recurrent infections in the last 1 year.  No systemic steroid use reported.  REVIEW OF SYSTEMS:  Review of Systems  Constitutional:  Negative for appetite change (75%), fever and unexpected weight change.  Respiratory:  Positive for shortness of breath.   Skin:  Negative for rash.  Psychiatric/Behavioral:  Positive for depression and sleep disturbance. The patient is nervous/anxious.   All other systems reviewed and are negative.   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   Diabetes mellitus    Hypertension    Prediabetes    Past Surgical History:  Procedure Laterality Date   CIRCUMCISION     CIRCUMCISION REVISION     FRENULECTOMY, LINGUAL     lipoma     ORCHIOPEXY      SOCIAL HISTORY:  Social History   Socioeconomic History   Marital status: Single    Spouse name: Not on file   Number of children: Not on file   Years of education: Not on file   Highest education level: Not on file  Occupational History   Not on file  Tobacco Use   Smoking status: Former    Packs/day: 0.25    Types: Cigarettes    Quit date: 01/09/2014    Years since quitting: 8.2   Smokeless tobacco: Never  Vaping Use   Vaping Use: Never used  Substance and Sexual  Activity   Alcohol use: No   Drug use: No   Sexual activity: Not on file  Other Topics Concern   Not on file  Social History Narrative   Lives with mom, sister, 2 nieces, grandparents and sister's fiance.    Social Determinants of Health   Financial Resource Strain: Not on file  Food Insecurity: Not on file  Transportation Needs: Not on file  Physical Activity: Not on file  Stress: Not on file  Social Connections: Not on file  Intimate Partner Violence: Not on file    FAMILY HISTORY:  Family History  Problem Relation Age of Onset   Obesity Mother    Diabetes Mother    Hypertension Mother    Hyperlipidemia Mother    Obesity Sister    Obesity Maternal Grandmother    Diabetes Maternal Grandmother    Cancer Maternal Grandmother    Stroke Maternal Grandmother    Obesity Maternal Grandfather     CURRENT MEDICATIONS:  Current Outpatient Medications  Medication Sig Dispense Refill   ALLERGY RELIEF 10 MG tablet Take 10 mg by mouth daily.     chlorthalidone (HYGROTON) 25 MG tablet TAKE 1 TABLETS BY MOUTH DAILY. 90 tablet 0   cloNIDine (CATAPRES) 0.2 MG tablet Take 1 tablet (0.2 mg total) by mouth 2 (two) times daily. 180 tablet 1   clotrimazole-betamethasone (LOTRISONE) cream  Apply topically 2 (two) times daily.     enalapril (VASOTEC) 20 MG tablet TAKE (1) TABLET BY MOUTH ONCE DAILY. 90 tablet 0   ferrous sulfate 325 (65 FE) MG tablet Take by mouth.     letrozole (FEMARA) 2.5 MG tablet Take 1 tablet (2.5 mg total) by mouth daily. 30 tablet 3   metFORMIN (GLUCOPHAGE) 500 MG tablet TAKE 1 TABLET BY MOUTH TWICE DAILY WITH MEALS. 60 tablet 5   Metoprolol Tartrate 75 MG TABS Take 75 mg by mouth 2 (two) times daily. 60 tablet 3   omeprazole (PRILOSEC) 40 MG capsule Take 1 capsule (40 mg total) by mouth 2 (two) times daily. 60 capsule 6   oxybutynin (DITROPAN-XL) 10 MG 24 hr tablet TAKE 1 TABLET BY MOUTH DAILY. 30 tablet 3   rosuvastatin (CRESTOR) 5 MG tablet TAKE ONE TABLET BY MOUTH  ONCE DAILY. 90 tablet 0   SYRINGE-NEEDLE, DISP, 3 ML (BD ECLIPSE SYRINGE) 21G X 1" 3 ML MISC USE TO INJECT TESTOSTERONE WEEKLY 50 each 1   testosterone cypionate (DEPOTESTOSTERONE CYPIONATE) 200 MG/ML injection INJECT 0.5ML INTO MUSCLE ONCE WEEKLY. SINGLE USE VIALS. 4 mL 0   traZODone (DESYREL) 100 MG tablet Take 50 mg by mouth at bedtime. Reported on 06/16/2015     Vitamin D, Ergocalciferol, (DRISDOL) 1.25 MG (50000 UNIT) CAPS capsule TAKE 1 CAPSULE THE SAME DAY EACH WEEK. 12 capsule 0   No current facility-administered medications for this visit.    ALLERGIES:  No Known Allergies  PHYSICAL EXAM:  Performance status (ECOG): 1 - Symptomatic but completely ambulatory  Vitals:   04/16/22 1450  BP: (!) 218/114  Pulse: (!) 130  Resp: 20  Temp: 100.2 F (37.9 C)  SpO2: 99%   Wt Readings from Last 3 Encounters:  04/16/22 (!) 435 lb 9.6 oz (197.6 kg)  01/23/21 (!) 426 lb (193.2 kg)  09/04/20 (!) 430 lb 6.4 oz (195.2 kg)   Physical Exam Vitals reviewed.  Constitutional:      Appearance: Normal appearance.  Cardiovascular:     Rate and Rhythm: Normal rate and regular rhythm.     Pulses: Normal pulses.     Heart sounds: Normal heart sounds.  Pulmonary:     Effort: Pulmonary effort is normal.     Breath sounds: Normal breath sounds.  Neurological:     General: No focal deficit present.     Mental Status: He is alert and oriented to person, place, and time.  Psychiatric:        Mood and Affect: Mood normal.        Behavior: Behavior normal.     LABORATORY DATA:  I have reviewed the labs as listed.     Latest Ref Rng & Units 04/09/2022    1:53 PM 04/05/2021    2:12 PM 01/17/2021    9:30 AM  CBC  WBC 4.0 - 10.5 K/uL 13.5  15.5  14.6   Hemoglobin 13.0 - 17.0 g/dL 13.4  14.2  13.6   Hematocrit 39.0 - 52.0 % 41.9  43.6  41.5   Platelets 150 - 400 K/uL 383  397  379       Latest Ref Rng & Units 08/29/2020    1:04 PM 03/22/2020    1:20 PM 01/26/2020   10:47 AM  CMP  Glucose  65 - 99 mg/dL 113  108  104   BUN 6 - 20 mg/dL _0 Creatinine 0.76 - 1.27 mg/dL 0.79  0.69  0.63   Sodium 134 - 144 mmol/L 140  137  140   Potassium 3.5 - 5.2 mmol/L 4.0  3.1  3.5   Chloride 96 - 106 mmol/L 101  101  100   CO2 20 - 29 mmol/L _0 Calcium 8.7 - 10.2 mg/dL 9.1  8.9  8.8   Total Protein 6.0 - 8.5 g/dL 7.6  8.0  6.9   Total Bilirubin 0.0 - 1.2 mg/dL 0.3  0.8  0.6   Alkaline Phos 44 - 121 IU/L 89  79    AST 0 - 40 IU/L _1 ALT 0 - 44 IU/L 34  37  26       Component Value Date/Time   RBC 5.13 04/09/2022 1353   MCV 81.7 04/09/2022 1353   MCV 81 01/17/2021 0930   MCH 26.1 04/09/2022 1353   MCHC 32.0 04/09/2022 1353   RDW 14.2 04/09/2022 1353   RDW 13.7 01/17/2021 0930   LYMPHSABS 3.6 04/09/2022 1353   LYMPHSABS 4.2 (H) 01/17/2021 0930   MONOABS 0.7 04/09/2022 1353   EOSABS 0.3 04/09/2022 1353   EOSABS 0.3 01/17/2021 0930   BASOSABS 0.0 04/09/2022 1353   BASOSABS 0.1 01/17/2021 0930    DIAGNOSTIC IMAGING:  I have independently reviewed the scans and discussed with the patient. No results found.   ASSESSMENT:  1.  Neutrophilic leukocytosis: -Previously tested for BCR/ABL and Jak 2 V6 50F mutation and was negative. -He is a non-smoker. -Denies any skin infections, dental infections.  Denies any steroid use.  Denies any history of splenectomy. -Denies any fevers, night sweats or weight loss. -We reviewed labs from 03/23/2019 which showed white count of 12.3.  Hemoglobin was normal at 14.3. -Ultrasound of the abdomen on 03/24/2017 showed normal-sized spleen.  Hepatic steatosis.   2.  Thrombocytosis: -He has history of mildly elevated platelet count for many years.   PLAN:  1.  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 - No infections in the last 1 year. - Reviewed labs from 04/09/2022 with white count improved to 13.5 from 15.5, predominantly neutrophils with ANC of 8.8.  LDH was normal.   Hemoglobin was normal. - RTC 1 year for follow-up with repeat labs.  If there is any significant changes in the white count, will consider bone marrow biopsy.   2.  Thrombocytosis: - Previously elevated platelet count has been normal last few times. - No further work-up needed.  Orders placed this encounter:  No orders of the defined types were placed in this encounter.    Derek Jack, MD [place] [phone]

## 2022-08-26 ENCOUNTER — Ambulatory Visit (INDEPENDENT_AMBULATORY_CARE_PROVIDER_SITE_OTHER): Payer: Medicaid Other | Admitting: Family Medicine

## 2022-08-26 ENCOUNTER — Encounter: Payer: Self-pay | Admitting: Family Medicine

## 2022-08-26 VITALS — BP 160/84 | HR 104 | Temp 97.6°F | Ht 70.0 in | Wt >= 6400 oz

## 2022-08-26 DIAGNOSIS — I10 Essential (primary) hypertension: Secondary | ICD-10-CM

## 2022-08-26 DIAGNOSIS — Z0001 Encounter for general adult medical examination with abnormal findings: Secondary | ICD-10-CM | POA: Diagnosis not present

## 2022-08-26 DIAGNOSIS — E782 Mixed hyperlipidemia: Secondary | ICD-10-CM

## 2022-08-26 DIAGNOSIS — R7303 Prediabetes: Secondary | ICD-10-CM

## 2022-08-26 DIAGNOSIS — E559 Vitamin D deficiency, unspecified: Secondary | ICD-10-CM

## 2022-08-26 DIAGNOSIS — Z1159 Encounter for screening for other viral diseases: Secondary | ICD-10-CM

## 2022-08-26 DIAGNOSIS — E291 Testicular hypofunction: Secondary | ICD-10-CM

## 2022-08-26 DIAGNOSIS — R32 Unspecified urinary incontinence: Secondary | ICD-10-CM

## 2022-08-26 DIAGNOSIS — Z114 Encounter for screening for human immunodeficiency virus [HIV]: Secondary | ICD-10-CM

## 2022-08-26 DIAGNOSIS — Q999 Chromosomal abnormality, unspecified: Secondary | ICD-10-CM

## 2022-08-26 DIAGNOSIS — E119 Type 2 diabetes mellitus without complications: Secondary | ICD-10-CM

## 2022-08-26 DIAGNOSIS — Z0000 Encounter for general adult medical examination without abnormal findings: Secondary | ICD-10-CM

## 2022-08-26 DIAGNOSIS — F909 Attention-deficit hyperactivity disorder, unspecified type: Secondary | ICD-10-CM

## 2022-08-26 MED ORDER — METFORMIN HCL 500 MG PO TABS: 500.0000 mg | ORAL_TABLET | Freq: Two times a day (BID) | ORAL | 5 refills | Status: DC

## 2022-08-26 MED ORDER — CHLORTHALIDONE 25 MG PO TABS: ORAL_TABLET | ORAL | 0 refills | Status: DC

## 2022-08-26 MED ORDER — ENALAPRIL MALEATE 10 MG PO TABS: 10.0000 mg | ORAL_TABLET | Freq: Every day | ORAL | 0 refills | Status: DC

## 2022-08-26 MED ORDER — ROSUVASTATIN CALCIUM 5 MG PO TABS: 5.0000 mg | ORAL_TABLET | Freq: Every day | ORAL | 0 refills | Status: DC

## 2022-08-26 MED ORDER — OXYBUTYNIN CHLORIDE ER 10 MG PO TB24: ORAL_TABLET | ORAL | 3 refills | Status: DC

## 2022-08-26 NOTE — Assessment & Plan Note (Signed)
Recheck with fasting labs.

## 2022-08-26 NOTE — Assessment & Plan Note (Addendum)
Uncontrolled. 160/84 today in office. Counseled on medication compliance and lifestyle modifications. Encouraged heart healthy diet and exercise 150 minutes weekly. Counseled on risks of weight and comorbid conditions. Instructed to seek medical care for chest pain, palpitations, shortness of breath, recurrent headaches, or vision changes. Will resume chlorthalidone 50mg  daily, enalaprl 10mg  daily, and metoprolol 75mg  BID and follow up in 1 month. Fasting labs to be drawn.

## 2022-08-26 NOTE — Assessment & Plan Note (Signed)

## 2022-08-26 NOTE — Progress Notes (Signed)
New Patient Office Visit  Subjective    Patient ID: Allen Harding, male    DOB: 1996/05/07  Age: 27 y.o. MRN: [id number]  CC:  Chief Complaint  Patient presents with   Establish Care    HPI Allen Harding presents to establish care. Oriented to practice routines and expectations. 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 No longer goes to Endocrinology. He is currently not taking his medications but did take his blood pressure and heart rate medications today, he has been off of these for 1 year and he is non-compliant with medications. His mother also reports weight gain. He does not work and is not in school. Does not monitor BP at home.  Needs A1c, foot exam, retinopathy exam, and uAcr. Tdap today.   Outpatient Encounter Medications as of 08/26/2022  Medication Sig   Metoprolol Tartrate 75 MG TABS Take 75 mg by mouth 2 (two) times daily.   omeprazole (PRILOSEC) 40 MG capsule Take 1 capsule (40 mg total) by mouth 2 (two) times daily.   SYRINGE-NEEDLE, DISP, 3 ML (BD ECLIPSE SYRINGE) 21G X 1" 3 ML MISC USE TO INJECT TESTOSTERONE WEEKLY   testosterone cypionate (DEPOTESTOSTERONE CYPIONATE) 200 MG/ML injection INJECT 0.5ML INTO MUSCLE ONCE WEEKLY. SINGLE USE VIALS.   traZODone (DESYREL) 100 MG tablet Take 50 mg by mouth at bedtime. Reported on 06/16/2015   Vitamin D, Ergocalciferol, (DRISDOL) 1.25 MG (50000 UNIT) CAPS capsule TAKE 1 CAPSULE THE SAME DAY EACH WEEK.   [DISCONTINUED] chlorthalidone (HYGROTON) 25 MG tablet TAKE 1 TABLETS BY MOUTH DAILY.   [DISCONTINUED] enalapril (VASOTEC) 20 MG tablet TAKE (1) TABLET BY MOUTH ONCE DAILY.   [DISCONTINUED] metFORMIN (GLUCOPHAGE) 500 MG tablet TAKE 1 TABLET BY MOUTH TWICE DAILY WITH MEALS.   [DISCONTINUED] oxybutynin (DITROPAN-XL) 10 MG 24 hr tablet TAKE 1 TABLET BY MOUTH DAILY.   [DISCONTINUED] rosuvastatin (CRESTOR) 5 MG tablet TAKE ONE TABLET BY MOUTH ONCE DAILY.   ALLERGY RELIEF  10 MG tablet Take 10 mg by mouth daily.   chlorthalidone (HYGROTON) 25 MG tablet TAKE 2 TABLETS BY MOUTH DAILY.   cloNIDine (CATAPRES) 0.2 MG tablet Take 1 tablet (0.2 mg total) by mouth 2 (two) times daily.   clotrimazole-betamethasone (LOTRISONE) cream Apply topically 2 (two) times daily.   enalapril (VASOTEC) 10 MG tablet Take 1 tablet (10 mg total) by mouth daily.   ferrous sulfate 325 (65 FE) MG tablet Take by mouth.   guanFACINE (TENEX) 2 MG tablet Take 2 mg by mouth at bedtime.   letrozole (FEMARA) 2.5 MG tablet Take 1 tablet (2.5 mg total) by mouth daily.   metFORMIN (GLUCOPHAGE) 500 MG tablet Take 1 tablet (500 mg total) by mouth 2 (two) times daily with a meal.   oxybutynin (DITROPAN-XL) 10 MG 24 hr tablet TAKE 1 TABLET BY MOUTH DAILY.   rosuvastatin (CRESTOR) 5 MG tablet Take 1 tablet (5 mg total) by mouth daily.   sertraline (ZOLOFT) 50 MG tablet Take 50 mg by mouth daily.   No facility-administered encounter medications on file as of 08/26/2022.    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   Diabetes mellitus    Hypertension    Prediabetes     Past Surgical History:  Procedure Laterality Date   CIRCUMCISION     CIRCUMCISION REVISION  FRENULECTOMY, LINGUAL     lipoma     ORCHIOPEXY      Family History  Problem Relation Age of Onset   Obesity Mother    Diabetes Mother    Hypertension Mother    Hyperlipidemia Mother    Obesity Sister    Obesity Maternal Grandmother    Diabetes Maternal Grandmother    Cancer Maternal Grandmother    Stroke Maternal Grandmother    Obesity Maternal Grandfather     Social History   Socioeconomic History   Marital status: Single    Spouse name: Not on file   Number of children: Not on file   Years of education: Not on file   Highest education level: Not on file  Occupational History    Not on file  Tobacco Use   Smoking status: Former    Packs/day: .25    Types: Cigarettes    Quit date: 01/09/2014    Years since quitting: 8.6   Smokeless tobacco: Never  Vaping Use   Vaping Use: Never used  Substance and Sexual Activity   Alcohol use: No   Drug use: No   Sexual activity: Not on file  Other Topics Concern   Not on file  Social History Narrative   Lives with mom, sister, 2 nieces, grandparents and sister's fiance.    Social Determinants of Health   Financial Resource Strain: Not on file  Food Insecurity: Not on file  Transportation Needs: Not on file  Physical Activity: Not on file  Stress: Not on file  Social Connections: Not on file  Intimate Partner Violence: Not on file    Review of Systems  Constitutional: Negative.   HENT: Negative.    Eyes: Negative.   Respiratory: Negative.    Cardiovascular: Negative.   Gastrointestinal: Negative.   Genitourinary: Negative.   Musculoskeletal: Negative.   Skin: Negative.   Neurological: Negative.   Endo/Heme/Allergies: Negative.   Psychiatric/Behavioral: Negative.    All other systems reviewed and are negative.       Objective    BP (!) 160/84   Pulse (!) 104   Temp 97.6 F (36.4 C) (Oral)   Ht 5\' 10"  (1.778 m)   Wt (!) 442 lb (200.5 kg)   SpO2 97%   BMI 63.42 kg/m   Physical Exam Vitals and nursing note reviewed.  Constitutional:      Appearance: Normal appearance. He is obese.  HENT:     Head: Normocephalic and atraumatic.     Right Ear: Tympanic membrane, ear canal and external ear normal.     Left Ear: Tympanic membrane, ear canal and external ear normal.     Nose: Nose normal.     Mouth/Throat:     Mouth: Mucous membranes are moist.     Pharynx: Oropharynx is clear.  Eyes:     Extraocular Movements: Extraocular movements intact.     Right eye: Normal extraocular motion and no nystagmus.     Left eye: Normal extraocular motion and no nystagmus.     Conjunctiva/sclera: Conjunctivae  normal.     Pupils: Pupils are equal, round, and reactive to light.  Cardiovascular:     Rate and Rhythm: Normal rate and regular rhythm.     Pulses: Normal pulses.     Heart sounds: Normal heart sounds.  Pulmonary:     Effort: Pulmonary effort is normal.     Breath sounds: Normal breath sounds.  Abdominal:     General: Bowel sounds are normal.  Palpations: Abdomen is soft.  Genitourinary:    Comments: Deferred using shared decision making Musculoskeletal:        General: Normal range of motion.     Cervical back: Normal range of motion and neck supple.  Skin:    General: Skin is warm and dry.     Capillary Refill: Capillary refill takes less than 2 seconds.  Neurological:     General: No focal deficit present.     Mental Status: He is alert. Mental status is at baseline.  Psychiatric:        Mood and Affect: Mood normal.        Speech: Speech normal.        Behavior: Behavior normal.        Thought Content: Thought content normal.        Cognition and Memory: Cognition and memory normal.        Judgment: Judgment normal.         Assessment & Plan:   Problem List Items Addressed This Visit       Cardiovascular and Mediastinum   Essential hypertension, benign    Uncontrolled. 160/84 today in office. Counseled on medication compliance and lifestyle modifications. Encouraged heart healthy diet and exercise 150 minutes weekly. Counseled on risks of weight and comorbid conditions. Instructed to seek medical care for chest pain, palpitations, shortness of breath, recurrent headaches, or vision changes. Will resume chlorthalidone 50mg  daily, enalaprl 10mg  daily, and metoprolol 75mg  BID and follow up in 1 month. Fasting labs to be drawn.      Relevant Medications   guanFACINE (TENEX) 2 MG tablet   chlorthalidone (HYGROTON) 25 MG tablet   enalapril (VASOTEC) 10 MG tablet   rosuvastatin (CRESTOR) 5 MG tablet   Other Relevant Orders   CBC with Differential/Platelet    COMPLETE METABOLIC PANEL WITH GFR   Lipid panel   TSH     Endocrine   Testicular hypofunction   Relevant Orders   Testosterone , Free and Total   Type 2 diabetes, HbA1c goal < 7% (HCC)    Not monitoring BG at home. Last A1c 6.1%. Counseled on medication compliance. Will recheck A1c and restart Metformin 500mg  BID.      Relevant Medications   enalapril (VASOTEC) 10 MG tablet   metFORMIN (GLUCOPHAGE) 500 MG tablet   rosuvastatin (CRESTOR) 5 MG tablet     Other   Pre-diabetes   Relevant Medications   metFORMIN (GLUCOPHAGE) 500 MG tablet   Other Relevant Orders   Hemoglobin A1c   Microalbumin / creatinine urine ratio   Vitamin B12   Morbid obesity due to excess calories ([place])    Referral to [facility]. Counseled on medication compliance.      Relevant Medications   metFORMIN (GLUCOPHAGE) 500 MG tablet   Other Relevant Orders   CBC with Differential/Platelet   COMPLETE METABOLIC PANEL WITH GFR   Lipid panel   TSH   Hemoglobin A1c   Amb Ref to Medical Weight Management   Chromosomal abnormality syndrome   Enuresis   Relevant Medications   oxybutynin (DITROPAN-XL) 10 MG 24 hr tablet   ADHD (attention deficit hyperactivity disorder)    Well controlled off medications at this time. Previously taking Guanfacine and Clonidine.      Mixed hyperlipidemia    Will check fasting labs. Counseled on medications compliance. Will restart Rosuvastatin 5mg  daily.      Relevant Medications   guanFACINE (TENEX) 2 MG tablet   chlorthalidone (HYGROTON) 25 MG  tablet   enalapril (VASOTEC) 10 MG tablet   rosuvastatin (CRESTOR) 5 MG tablet   Other Relevant Orders   Lipid panel   Vitamin D deficiency    Recheck with fasting labs.      Relevant Orders   VITAMIN D 25 Hydroxy (Vit-D Deficiency, Fractures)   Physical exam, annual - Primary    Today your medical history was reviewed and routine physical exam with labs was performed. Recommend 150 minutes of moderate intensity exercise weekly and  consuming a well-balanced diet. Advised to stop smoking if a smoker, avoid smoking if a non-smoker, limit alcohol consumption to 1 drink per day for women and 2 drinks per day for men, and avoid illicit drug use. Counseled on safe sex practices and offered STI testing today. Counseled on the importance of sunscreen use. Counseled in mental health awareness and when to seek medical care. Vaccine maintenance discussed. Appropriate health maintenance items reviewed. Return to office in 1 year for annual physical exam.       Other Visit Diagnoses     Screening for HIV (human immunodeficiency virus)       Relevant Orders   HIV Antibody (routine testing w rflx)   Need for hepatitis C screening test       Relevant Orders   Hepatitis C antibody       Return in about 4 weeks (around 09/23/2022) for blood pressure and fasting labs prior.   Rubie Maid, FNP

## 2022-08-26 NOTE — Assessment & Plan Note (Signed)
Will check fasting labs. Counseled on medications compliance. Will restart Rosuvastatin 5mg  daily.

## 2022-08-26 NOTE — Assessment & Plan Note (Signed)
Referral to [facility]. Counseled on medication compliance.

## 2022-08-26 NOTE — Patient Instructions (Signed)
It was great to meet you today and I'm excited to have you join the [facility] practice. I hope you had a positive experience today! If you feel so inclined, please feel free to recommend our practice to friends and family. Brynnlee Cumpian, FNP-C  

## 2022-08-26 NOTE — Assessment & Plan Note (Signed)
Well controlled off medications at this time. Previously taking Guanfacine and Clonidine.

## 2022-08-26 NOTE — Assessment & Plan Note (Signed)
Not monitoring BG at home. Last A1c 6.1%. Counseled on medication compliance. Will recheck A1c and restart Metformin 500mg  BID.

## 2022-08-27 LAB — MICROALBUMIN / CREATININE URINE RATIO
Creatinine, Urine: 96 mg/dL (ref 20–320)
Microalb Creat Ratio: 28 mg/g creat (ref <30)
Microalb, Ur: 2.7 mg/dL

## 2022-09-03 ENCOUNTER — Other Ambulatory Visit: Payer: Medicaid Other

## 2022-09-03 DIAGNOSIS — Z114 Encounter for screening for human immunodeficiency virus [HIV]: Secondary | ICD-10-CM

## 2022-09-03 DIAGNOSIS — Z1159 Encounter for screening for other viral diseases: Secondary | ICD-10-CM

## 2022-09-03 DIAGNOSIS — E291 Testicular hypofunction: Secondary | ICD-10-CM

## 2022-09-03 DIAGNOSIS — R7303 Prediabetes: Secondary | ICD-10-CM

## 2022-09-03 DIAGNOSIS — E559 Vitamin D deficiency, unspecified: Secondary | ICD-10-CM

## 2022-09-03 DIAGNOSIS — I10 Essential (primary) hypertension: Secondary | ICD-10-CM

## 2022-09-03 DIAGNOSIS — E782 Mixed hyperlipidemia: Secondary | ICD-10-CM

## 2022-09-07 LAB — TESTOSTERONE, FREE & TOTAL
Free Testosterone: 8.1 pg/mL — ABNORMAL LOW (ref 35.0–155.0)
Testosterone, Total, LC-MS-MS: 42 ng/dL — ABNORMAL LOW (ref 250–1100)

## 2022-09-09 ENCOUNTER — Telehealth: Payer: Self-pay | Admitting: Family Medicine

## 2022-09-09 ENCOUNTER — Other Ambulatory Visit: Payer: Self-pay | Admitting: Family Medicine

## 2022-09-09 DIAGNOSIS — E291 Testicular hypofunction: Secondary | ICD-10-CM

## 2022-09-09 DIAGNOSIS — E559 Vitamin D deficiency, unspecified: Secondary | ICD-10-CM

## 2022-09-09 DIAGNOSIS — E119 Type 2 diabetes mellitus without complications: Secondary | ICD-10-CM

## 2022-09-09 LAB — CBC WITH DIFFERENTIAL/PLATELET
Absolute Monocytes: 752 cells/uL (ref 200–950)
Basophils Absolute: 53 cells/uL (ref 0–200)
Basophils Relative: 0.4 %
Eosinophils Absolute: 290 cells/uL (ref 15–500)
Eosinophils Relative: 2.2 %
HCT: 41.9 % (ref 38.5–50.0)
Hemoglobin: 13.4 g/dL (ref 13.2–17.1)
Lymphs Abs: 3630 cells/uL (ref 850–3900)
MCH: 25.8 pg — ABNORMAL LOW (ref 27.0–33.0)
MCHC: 32 g/dL (ref 32.0–36.0)
MCV: 80.7 fL (ref 80.0–100.0)
MPV: 8.9 fL (ref 7.5–12.5)
Monocytes Relative: 5.7 %
Neutro Abs: 8474 cells/uL — ABNORMAL HIGH (ref 1500–7800)
Neutrophils Relative %: 64.2 %
Platelets: 420 10*3/uL — ABNORMAL HIGH (ref 140–400)
RBC: 5.19 10*6/uL (ref 4.20–5.80)
RDW: 13.8 % (ref 11.0–15.0)
Total Lymphocyte: 27.5 %
WBC: 13.2 10*3/uL — ABNORMAL HIGH (ref 3.8–10.8)

## 2022-09-09 LAB — LIPID PANEL
Cholesterol: 201 mg/dL — ABNORMAL HIGH (ref <200)
HDL: 51 mg/dL (ref >=40)
LDL Cholesterol (Calc): 130 mg/dL (calc) — ABNORMAL HIGH
Non-HDL Cholesterol (Calc): 150 mg/dL (calc) — ABNORMAL HIGH (ref <130)
Total CHOL/HDL Ratio: 3.9 (calc) (ref <5.0)
Triglycerides: 92 mg/dL (ref <150)

## 2022-09-09 LAB — TSH: TSH: 3.21 mIU/L (ref 0.40–4.50)

## 2022-09-09 LAB — COMPLETE METABOLIC PANEL WITH GFR
AG Ratio: 1.1 (calc) (ref 1.0–2.5)
ALT: 22 U/L (ref 9–46)
AST: 19 U/L (ref 10–40)
Albumin: 3.7 g/dL (ref 3.6–5.1)
Alkaline phosphatase (APISO): 88 U/L (ref 36–130)
BUN: 13 mg/dL (ref 7–25)
CO2: 27 mmol/L (ref 20–32)
Calcium: 8.8 mg/dL (ref 8.6–10.3)
Chloride: 102 mmol/L (ref 98–110)
Creat: 0.65 mg/dL (ref 0.60–1.24)
Globulin: 3.4 g/dL (calc) (ref 1.9–3.7)
Glucose, Bld: 149 mg/dL — ABNORMAL HIGH (ref 65–99)
Potassium: 3.5 mmol/L (ref 3.5–5.3)
Sodium: 140 mmol/L (ref 135–146)
Total Bilirubin: 0.5 mg/dL (ref 0.2–1.2)
Total Protein: 7.1 g/dL (ref 6.1–8.1)
eGFR: 132 mL/min/{1.73_m2} (ref >=60)

## 2022-09-09 LAB — VITAMIN B12: Vitamin B-12: 411 pg/mL (ref 200–1100)

## 2022-09-09 LAB — FSH/LH
FSH: 1.7 m[IU]/mL (ref 1.4–12.8)
LH: 3.2 m[IU]/mL (ref 1.5–9.3)

## 2022-09-09 LAB — HEMOGLOBIN A1C
Hgb A1c MFr Bld: 7.6 % of total Hgb — ABNORMAL HIGH (ref <5.7)
Mean Plasma Glucose: 171 mg/dL
eAG (mmol/L): 9.5 mmol/L

## 2022-09-09 LAB — VITAMIN D 25 HYDROXY (VIT D DEFICIENCY, FRACTURES): Vit D, 25-Hydroxy: 26 ng/mL — ABNORMAL LOW (ref 30–100)

## 2022-09-09 LAB — TEST AUTHORIZATION

## 2022-09-09 LAB — HIV ANTIBODY (ROUTINE TESTING W REFLEX): HIV 1&2 Ab, 4th Generation: NONREACTIVE

## 2022-09-09 LAB — HEPATITIS C ANTIBODY: Hepatitis C Ab: NONREACTIVE

## 2022-09-09 MED ORDER — VITAMIN D3 20 MCG (800 UNIT) PO TABS: 1.0000 | ORAL_TABLET | Freq: Every day | ORAL | 0 refills | Status: DC

## 2022-09-09 NOTE — Telephone Encounter (Signed)
Spoke with patients guardian, Andrey Campanile, verified 2 patient identifiers. Discussed lab results. Patient has resumed Metformin 500mg  BID. FSH and LH added on to labs per endocrinology note and will refer back to [facility] for hypogonadism. He has resumed Crestor 5mg  daily. Ordered Vitamin D 800 units daily. BP is improved after resuming medications, <140/90 at home. Following up in office on 09/23/22.

## 2022-09-23 ENCOUNTER — Ambulatory Visit: Payer: Medicaid Other | Admitting: Family Medicine

## 2022-10-22 ENCOUNTER — Encounter: Payer: Self-pay | Admitting: "Endocrinology

## 2022-10-23 ENCOUNTER — Ambulatory Visit (INDEPENDENT_AMBULATORY_CARE_PROVIDER_SITE_OTHER): Payer: Medicaid Other | Admitting: Family Medicine

## 2022-10-23 VITALS — BP 160/100 | HR 116 | Temp 97.8°F | Ht 70.0 in | Wt >= 6400 oz

## 2022-10-23 DIAGNOSIS — Z7984 Long term (current) use of oral hypoglycemic drugs: Secondary | ICD-10-CM

## 2022-10-23 DIAGNOSIS — I10 Essential (primary) hypertension: Secondary | ICD-10-CM | POA: Diagnosis not present

## 2022-10-23 DIAGNOSIS — E119 Type 2 diabetes mellitus without complications: Secondary | ICD-10-CM

## 2022-10-23 NOTE — Assessment & Plan Note (Signed)
Not monitoring BG at home. Last A1c 7.6%. Counseled on medication compliance.

## 2022-10-23 NOTE — Assessment & Plan Note (Signed)
Uncontrolled. Counseled on medication compliance and lifestyle modifications. Encouraged heart healthy diet and exercise 150 minutes weekly. Counseled on risks of weight and comorbid conditions. Instructed to seek medical care for chest pain, palpitations, shortness of breath, recurrent headaches, or vision changes. Pharmacy chronic care management referral placed for help with medication adherence. Follow up in 1 month for BP. Fasting labs to be drawn.

## 2022-10-23 NOTE — Patient Instructions (Addendum)
Medical Weight Management [phone]  Recommend heart healthy diet such as Mediterranean diet with whole grains, fruits, vegetable, fish, lean meats, nuts, and olive oil. Limit salt. Encouraged moderate walking, 3-5 times/week for 30-50 minutes each session. Aim for at least 150 minutes.week. Goal should be pace of 3 miles/hours, or walking 1.5 miles in 30 minutes. Avoid tobacco products. Avoid excess alcohol. Take medications as prescribed and bring medications and blood pressure log with cuff to each office visit. Seek medical care for chest pain, palpitations, shortness of breath with exertion, dizziness/lightheadedness, vision changes, recurrent headaches, or swelling of extremities.

## 2022-10-23 NOTE — Assessment & Plan Note (Signed)
Counseled on importance of weight management for overall health. Discussed importance of calorie restriction and eating a heart healthy diet as well as getting 150 minutes of moderate intensity exercise weekly. Patient is non-compliant with medications and treatment plans. We had a long discussion today about his overall health and the importance of mitigating risk factors for heart disease and other conditions. Referral to [facility].

## 2022-10-23 NOTE — Progress Notes (Signed)
Acute Office Visit  Subjective:     Patient ID: Allen Harding, male    DOB: January 13, 1996, 27 y.o.   MRN: [id number]  Chief Complaint  Patient presents with   Follow-up    4 wk f/u    HPI Patient is in today for follow-up for blood pressure and diabetes. He was seen in my office on 08/26/2022 to establish care at which time he was not taking many of his medications and his blood pressure was elevated 160/84. I counseled him on the importance of medication compliance, he was prescribed chlorthalidone 50mg  daily, enalapril 10mg  daily, and metoprolol 75mg  BID. He was also started on Crestor 5mg  daily due to his elevated cholesterol and multiple risk factors. In addition he was restarted on Metformin 500mg  BID. His BP was reportedly improved with resumption of his home medications per a telephone conversation with his mother. He subsequently missed his one month follow up appointment.   Today he is here for follow up appointment for his chronic conditions. He reports he does not take his medications regularly due to forgetting them but did take them this morning. He tries to exercise 2x weekly on a total gym at home. He has not made any dietary changes. He reports he has not received a call to schedule his appointment with [facility].  HYPERTENSION without Chronic Kidney Disease Hypertension status: uncontrolled  Satisfied with current treatment? no Duration of hypertension: chronic BP monitoring frequency:  not checking BP range:  BP medication side effects:  no Medication compliance: poor compliance Previous BP meds:chlorthalidone, Enalapril, Metoprolol, Clonidine Aspirin: no Recurrent headaches: no Visual changes: no Palpitations: no Dyspnea: no Chest pain: no Lower extremity edema: no Dizzy/lightheaded: no  DIABETES Hypoglycemic episodes:no Polydipsia/polyuria: no Visual disturbance: no Chest pain: no Paresthesias: no Glucose Monitoring: no  Accucheck frequency: Not  Checking  Fasting glucose:  Post prandial:  Evening:  Before meals: Taking Insulin?: no  Long acting insulin:  Short acting insulin: Blood Pressure Monitoring: not checking Retinal Examination: Not up to Date Foot Exam: Not up to Date Diabetic Education: Not Completed Pneumovax: unknown Influenza: unknown Aspirin: no   Review of Systems  All other systems reviewed and are negative.       Objective:    BP (!) 160/100   Pulse (!) 116   Temp 97.8 F (36.6 C) (Oral)   Ht 5\' 10"  (1.778 m)   Wt (!) 446 lb (202.3 kg)   SpO2 95%   BMI 63.99 kg/m  BP Readings from Last 3 Encounters:  10/23/22 (!) 160/100  08/26/22 (!) 160/84  04/16/22 (!) 218/114   Wt Readings from Last 3 Encounters:  10/23/22 (!) 446 lb (202.3 kg)  08/26/22 (!) 442 lb (200.5 kg)  04/16/22 (!) 435 lb 9.6 oz (197.6 kg)      Physical Exam Vitals and nursing note reviewed.  Constitutional:      Appearance: Normal appearance. He is normal weight.  HENT:     Head: Normocephalic and atraumatic.  Cardiovascular:     Rate and Rhythm: Normal rate and regular rhythm.     Pulses: Normal pulses.     Heart sounds: Normal heart sounds.  Pulmonary:     Effort: Pulmonary effort is normal.     Breath sounds: Normal breath sounds.  Skin:    General: Skin is warm and dry.     Capillary Refill: Capillary refill takes less than 2 seconds.  Neurological:     General: No focal deficit present.  Mental Status: He is alert and oriented to person, place, and time. Mental status is at baseline.  Psychiatric:        Mood and Affect: Mood normal.        Behavior: Behavior normal.        Thought Content: Thought content normal.        Judgment: Judgment normal.     No results found for any visits on 10/23/22.      Assessment & Plan:   Problem List Items Addressed This Visit     Morbid obesity due to excess calories (HCC)    Counseled on importance of weight management for overall health. Discussed  importance of calorie restriction and eating a heart healthy diet as well as getting 150 minutes of moderate intensity exercise weekly. Patient is non-compliant with medications and treatment plans. We had a long discussion today about his overall health and the importance of mitigating risk factors for heart disease and other conditions. Referral to [facility].      Essential hypertension, benign    Uncontrolled. Counseled on medication compliance and lifestyle modifications. Encouraged heart healthy diet and exercise 150 minutes weekly. Counseled on risks of weight and comorbid conditions. Instructed to seek medical care for chest pain, palpitations, shortness of breath, recurrent headaches, or vision changes. Pharmacy chronic care management referral placed for help with medication adherence. Follow up in 1 month for BP. Fasting labs to be drawn.      Type 2 diabetes, HbA1c goal < 7% (HCC) - Primary    Not monitoring BG at home. Last A1c 7.6%. Counseled on medication compliance.        No orders of the defined types were placed in this encounter.   Return in about 4 weeks (around 11/20/2022) for chronic follow-up with labs 1 week prior.  [name], FNP

## 2022-10-29 ENCOUNTER — Ambulatory Visit (INDEPENDENT_AMBULATORY_CARE_PROVIDER_SITE_OTHER): Payer: Medicaid Other | Admitting: Adult Health

## 2022-10-29 ENCOUNTER — Encounter (INDEPENDENT_AMBULATORY_CARE_PROVIDER_SITE_OTHER): Payer: Self-pay | Admitting: Adult Health

## 2022-10-29 VITALS — BP 157/86 | HR 144 | Temp 98.2°F | Ht 70.0 in | Wt >= 6400 oz

## 2022-10-29 DIAGNOSIS — Z6841 Body Mass Index (BMI) 40.0 and over, adult: Secondary | ICD-10-CM

## 2022-10-29 DIAGNOSIS — E119 Type 2 diabetes mellitus without complications: Secondary | ICD-10-CM

## 2022-10-29 DIAGNOSIS — E1159 Type 2 diabetes mellitus with other circulatory complications: Secondary | ICD-10-CM | POA: Diagnosis not present

## 2022-10-29 DIAGNOSIS — I152 Hypertension secondary to endocrine disorders: Secondary | ICD-10-CM

## 2022-10-29 DIAGNOSIS — Z7984 Long term (current) use of oral hypoglycemic drugs: Secondary | ICD-10-CM

## 2022-10-29 NOTE — Progress Notes (Signed)
Office: [phone]  /  Fax: [phone]   Initial Visit  Allen Harding was seen in clinic today to evaluate for obesity. He is interested in losing weight to improve overall health and reduce the risk of weight related complications. He presents today to review program treatment options, initial physical assessment, and evaluation.     He was referred by: PCP  When asked what else they would like to accomplish? He states: Adopt healthier eating patterns, Improve energy levels and physical activity, Improve existing medical conditions, Reduce number of medications, Improve quality of life, and Improve self-confidence  Weight history: Per pt and Pt's mother "Allen Harding"- lifelong battle with elevated weight, since age 45.  When asked how has your weight affected you? He states: Has affected self-esteem, Relationships, Contributed to medical problems, Having fatigue, Having poor endurance, and Has affected mood   Some associated conditions: Hypertension, Fatty liver disease, and Diabetes  Contributing factors: Family history, Nutritional, Reduced physical activity, and Mental health problems  Weight promoting medications identified: Beta-blockers  Current nutrition plan: None  Current level of physical activity: Limited due to chronic pain or orthopedic problems- bilateral leg pain r/t to prolonged standing/walking. Home gym equipment: Stationary Bike Treadmill Total Gym  Not used any equipment in years  Current or previous pharmacotherapy: Metformin  Response to medication: Other: Currently on Metformin 500mg - 2 tabs BID, usually takes both tabs at bedtime- hours after dinner. He will experienced intermittent "queasiness" with Metformin.  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  Peak Weight:446 lbs , Body Fat%:47.8, Visceral Fat Rating:38, Weight trend over the last 12 months: Increasing  General:  Alert, oriented and cooperative. Patient is in no acute distress.  Respiratory: Normal respiratory effort, no problems with respiration noted   Gait: able to ambulate independently  Mental Status: Normal mood and affect. Normal behavior. Normal judgment and thought content.   DIAGNOSTIC DATA REVIEWED:  BMET    Component Value Date/Time   NA 140 09/03/2022 0850   NA 140 08/29/2020 1304   K 3.5 09/03/2022 0850   CL 102 09/03/2022 0850   CO2 27 09/03/2022 0850   GLUCOSE 149 (H) 09/03/2022 0850   BUN 13 09/03/2022 0850   BUN 8 08/29/2020 1304   CREATININE 0.65 09/03/2022 0850   CALCIUM 8.8 09/03/2022 0850   GFRNONAA >60 03/22/2020 1320   GFRNONAA 138 01/26/2020 1047   GFRAA 160 01/26/2020 1047   Lab Results  Component Value Date   HGBA1C 7.6 (H) 09/03/2022   HGBA1C 5.9 01/17/2011   No results found for: "INSULIN" CBC    Component Value Date/Time   WBC 13.2 (H) 09/03/2022 0850   RBC 5.19 09/03/2022  0850   HGB 13.4 09/03/2022 0850   HGB 13.6 01/17/2021 0930   HCT 41.9 09/03/2022 0850   HCT 41.5 01/17/2021 0930   PLT 420 (H) 09/03/2022 0850   PLT 379 01/17/2021 0930   MCV 80.7 09/03/2022 0850   MCV 81 01/17/2021 0930   MCH 25.8 (L) 09/03/2022 0850   MCHC 32.0 09/03/2022 0850   RDW 13.8 09/03/2022 0850   RDW 13.7 01/17/2021 0930   Iron/TIBC/Ferritin/ %Sat No  results found for: "IRON", "TIBC", "FERRITIN", "IRONPCTSAT" Lipid Panel     Component Value Date/Time   CHOL 201 (H) 09/03/2022 0850   CHOL 181 08/29/2020 1304   TRIG 92 09/03/2022 0850   HDL 51 09/03/2022 0850   HDL 38 (L) 08/29/2020 1304   CHOLHDL 3.9 09/03/2022 0850   VLDL 21 08/08/2015 1152   LDLCALC 130 (H) 09/03/2022 0850   Hepatic Function Panel     Component Value Date/Time   PROT 7.1 09/03/2022 0850   PROT 7.6 08/29/2020 1304   ALBUMIN 3.8 (L) 08/29/2020 1304   AST 19 09/03/2022 0850   ALT 22 09/03/2022 0850   ALKPHOS 89 08/29/2020 1304   BILITOT 0.5 09/03/2022 0850   BILITOT 0.3 08/29/2020 1304      Component Value Date/Time   TSH 3.21 09/03/2022 0850    Assessment and Plan:   Type 2 diabetes, HbA1c goal < 7% (HCC)  Hypertension associated with diabetes (HCC)  Morbid obesity, Starting BMI 62.9    Obesity Treatment / Action Plan:  Patient will work on garnering support from family and friends to begin weight loss journey. Will work on eliminating or reducing the presence of highly palatable, calorie dense foods in the home. Will complete provided nutritional and psychosocial assessment questionnaire before the next appointment. Will be scheduled for indirect calorimetry to determine resting energy expenditure in a fasting state.  This will allow [place] to create a reduced calorie, high-protein meal plan to promote loss of fat mass while preserving muscle mass. Counseled on the health benefits of losing 5%-15% of total body weight. Was counseled on nutritional approaches to weight loss and benefits of reducing processed foods and consuming plant-based foods and high quality protein as part of nutritional weight management. Was counseled on pharmacotherapy and role as an adjunct in weight management.   Obesity Education Performed Today:  He was weighed on the bioimpedance scale and results were discussed and documented in the synopsis.  We discussed obesity as  a disease and the importance of a more detailed evaluation of all the factors contributing to the disease.  We discussed the importance of long term lifestyle changes which include nutrition, exercise and behavioral modifications as well as the importance of customizing this to his specific health and social needs.  We discussed the benefits of reaching a healthier weight to alleviate the symptoms of existing conditions and reduce the risks of the biomechanical, metabolic and psychological effects of obesity.  PRESCOTT MCCONNELL appears to be in the action stage of change and states they are ready to start intensive lifestyle modifications and behavioral modifications.  30 minutes was spent today on this visit including the above counseling, pre-visit chart review, and post-visit documentation.  Reviewed by clinician on day of visit: allergies, medications, problem list, medical history, surgical history, family history, social history, and previous encounter notes pertinent to obesity diagnosis.   Allen Michie d. Paysen Goza, NP-C

## 2022-11-04 DIAGNOSIS — Z0289 Encounter for other administrative examinations: Secondary | ICD-10-CM

## 2022-11-15 ENCOUNTER — Other Ambulatory Visit: Payer: Medicaid Other

## 2022-11-15 DIAGNOSIS — I10 Essential (primary) hypertension: Secondary | ICD-10-CM

## 2022-11-15 DIAGNOSIS — I152 Hypertension secondary to endocrine disorders: Secondary | ICD-10-CM

## 2022-11-15 DIAGNOSIS — E782 Mixed hyperlipidemia: Secondary | ICD-10-CM

## 2022-11-15 DIAGNOSIS — E119 Type 2 diabetes mellitus without complications: Secondary | ICD-10-CM

## 2022-11-15 DIAGNOSIS — D72829 Elevated white blood cell count, unspecified: Secondary | ICD-10-CM

## 2022-11-19 ENCOUNTER — Ambulatory Visit (INDEPENDENT_AMBULATORY_CARE_PROVIDER_SITE_OTHER): Payer: Medicaid Other | Admitting: Family Medicine

## 2022-11-19 ENCOUNTER — Encounter (INDEPENDENT_AMBULATORY_CARE_PROVIDER_SITE_OTHER): Payer: Self-pay | Admitting: Family Medicine

## 2022-11-19 VITALS — BP 132/88 | HR 79 | Temp 97.8°F | Ht 70.0 in | Wt >= 6400 oz

## 2022-11-19 DIAGNOSIS — Z1331 Encounter for screening for depression: Secondary | ICD-10-CM

## 2022-11-19 DIAGNOSIS — R7303 Prediabetes: Secondary | ICD-10-CM

## 2022-11-19 DIAGNOSIS — E559 Vitamin D deficiency, unspecified: Secondary | ICD-10-CM | POA: Diagnosis not present

## 2022-11-19 DIAGNOSIS — Z6841 Body Mass Index (BMI) 40.0 and over, adult: Secondary | ICD-10-CM

## 2022-11-19 DIAGNOSIS — I10 Essential (primary) hypertension: Secondary | ICD-10-CM

## 2022-11-19 DIAGNOSIS — E782 Mixed hyperlipidemia: Secondary | ICD-10-CM | POA: Diagnosis not present

## 2022-11-19 DIAGNOSIS — F39 Unspecified mood [affective] disorder: Secondary | ICD-10-CM

## 2022-11-19 DIAGNOSIS — R0602 Shortness of breath: Secondary | ICD-10-CM | POA: Diagnosis not present

## 2022-11-19 DIAGNOSIS — R5383 Other fatigue: Secondary | ICD-10-CM

## 2022-11-19 NOTE — Progress Notes (Signed)
[name], D.O.  ABFM, ABOM Specializing in Clinical Bariatric Medicine Office located at: [address]  [place], [place]  [address]     Bariatric Medicine Visit  Dear [name], FNP   Thank you for referring DREY GOODMAN to our clinic today for evaluation.  We performed a consultation to discuss his options for treatment and educate the patient on his disease state.  The following note includes my evaluation and treatment recommendations.   Please do not hesitate to reach out to me directly if you have any further concerns.    Assessment and Plan:   Orders Placed This Encounter  Procedures   CBC with Differential/Platelet   Comprehensive metabolic panel   Folate   Hemoglobin A1c   Insulin, random   Lipid Panel With LDL/HDL Ratio   T4, free   TSH   Vitamin B12   VITAMIN D 25 Hydroxy (Vit-D Deficiency, Fractures)   EKG 12-Lead    Medications Discontinued During This Encounter  Medication Reason   clotrimazole-betamethasone (LOTRISONE) cream    SYRINGE-NEEDLE, DISP, 3 ML (BD ECLIPSE SYRINGE) 21G X 1" 3 ML MISC    traZODone (DESYREL) 100 MG tablet    omeprazole (PRILOSEC) 40 MG capsule    ALLERGY RELIEF 10 MG tablet    cloNIDine (CATAPRES) 0.2 MG tablet    ferrous sulfate 325 (65 FE) MG tablet    guanFACINE (TENEX) 2 MG tablet    letrozole (FEMARA) 2.5 MG tablet    sertraline (ZOLOFT) 50 MG tablet      1) Fatigue Assessment: Condition is Not optimized.  Burnice does feel that his weight is causing his energy to be lower than it should be. Fatigue may be related to obesity, depression or many other causes. he does not appear to have any red flag symptoms and this appears to most likely be related to his current lifestyle habits and dietary intake.  Plan:  Labs will be ordered and reviewed with him at their next office visit in two weeks. Epworth sleepiness scale score appears to be within normal limits.  His ESS score is 3.  Jaxstin denies daytime  somnolence and admits to waking up still tired.  Shinya generally gets 6 hours of sleep per night, and states that he has generally restful sleep. Snoring is not present. Apneic episodes is not present.  ECG: Normal sinus rhythm, rate 85 bpm; reassuring without any acute abnormalities, will continue to monitor for symptoms  Depression screen/Modified PHQ-9 Depression Screen: His Food and Mood (modified PHQ-9) score was 8. In the meanwhile, Ayhan will focus on self care including making healthy food choices by following their meal plan, improving sleep quality and focusing on stress reduction.  Once we are assured he is on an appropriate meal plan, we will start discussing exercise to increase cardiovascular fitness levels.     2) Shortness of breath on exertion Assessment: Condition is not optimized. Garlin does feel that he gets out of breath more easily than he used to when he exercises and seems to be worsening over time with weight gain.  This has gotten worse recently. Airon denies shortness of breath at rest or orthopnea. Amrit's shortness of breath appears to be obesity related and exercise induced, as they do not appear to have any "red flag" symptoms/ concerns today.  Also, this condition appears to be related to a state of poor cardiovascular conditioning   Plan:  Obtain labs today and will be reviewed with him  at their next office visit in two weeks. Indirect Calorimeter completed today to help guide our dietary regimen. It shows a VO2 of 499 and a REE of 3442.  His calculated basal metabolic rate is 1610 thus his measured basal metabolic rate is slightly worse than expected. Patient agreed to work on weight loss at this time.  As Latroy progresses through our weight loss program, we will gradually increase exercise as tolerated to treat his current condition.   If Savone follows our recommendations and loses 5-10% of their weight without improvement of his shortness of breath or if at any time,  symptoms become more concerning, they agree to urgently follow up with their PCP/ specialist for further consideration/ evaluation.   Medford verbalizes agreement with this plan.     Mood disorder (HCC) - Emotional Eating Assessment: Denies any SI/HI. Mood is stable. Pt endorses having a hx of anxiety and depression. He is not currently taking an mood medications. He also endorses eating when bored.   Plan: Check labs. Patient was referred to Dr. Dewaine Conger, our Bariatric Psychologist, for evaluation due to his struggles with emotional eating.  Reminded patient of the importance of following their prudent nutrition plan and how food can affect mood as well to support emotional wellbeing. We will continue to monitor closely alongside PCP / other specialists.     Pre-diabetes Assessment: Condition is not optimized. Pt endorses being on Metformin 500 mg BID per his PCP for " the past few years". Denies any N/V/D. Pt reports never being told he is a diabetic.   Lab Results  Component Value Date   HGBA1C 7.6 (H) 09/03/2022   HGBA1C 6.1 01/23/2021   HGBA1C 6.3 09/04/2020    Plan: Recheck labs. Continue with medication as prescribed by PCP.  Shanan will begin to work on weight loss, exercise, via their meal plan we devised to help decrease his A1c. We will recheck A1c and fasting insulin level in approximately 3 months from last check, or as deemed appropriate.      Essential hypertension, benign Assessment: Blood pressure is stable today. Errett endorses being diagnosed with Hypertension 6 yrs ago and takes Metoprolol 75 mg BID, Chlorthalidone 25 mg BID, and Enalapril 10 mg daily. Denies any adverse effects.  Last 3 blood pressure readings in our office are as follows: BP Readings from Last 3 Encounters:  11/19/22 132/88  10/29/22 (!) 157/86  10/23/22 (!) 160/100   Plan:Continue with all meds at current doses as recommended by specialist/PCP.   Ambulatory blood pressure monitoring  encouraged.  Reminded patient that if they ever feel poorly in any way, to check their blood pressure and pulse as well.We will continue to monitor closely alongside PCP/ specialists.  Pt reminded to also f/up with those individuals as instructed by them.  We will continue to monitor symptoms as they relate to the his weight loss journey.     Mixed hyperlipidemia Assessment: Condition is not optimized. Macoy endorses being diagnosed with Mixed hyperlipidemia 4-5 yrs ago. He is currently taking Crestor 5 mg daily. Denies any side effects .  Lab Results  Component Value Date   CHOL 201 (H) 09/03/2022   HDL 51 09/03/2022   LDLCALC 130 (H) 09/03/2022   TRIG 92 09/03/2022   CHOLHDL 3.9 09/03/2022   Plan: Check labs. Lamonte Sakai agrees to continue with statin and our treatment plan that is low in saturated and trans fats, and low in fatty carbs to improve these numbers. We will continue  routine screening as patient continues to achieve health goals along their weight loss journey    Vitamin D deficiency Assessment: Condition is not at goal. Jaydynn endorses taking Ergocalciferol 50K IU weekly and a daily Multivitamin. Denies any adverse effects.  Lab Results  Component Value Date   VD25OH 26 (L) 09/03/2022   VD25OH 17 (L) 01/26/2020   VD25OH 17 (L) 09/16/2019   Plan: Check labs. Pt advised to maintain with both supplements.   Weight loss will likely improve availability of vitamin D, thus encouraged Forbes to continue with meal plan and their weight loss efforts to further improve this condition. Will begin to monitor condition regularly.    TREATMENT PLAN FOR OBESITY: Morbid obesity due to excess calories [facility]) Assessment: Condition is not optimized. Biometric data collected today, was reviewed with patient.  Since information session visit on 10/29/22, Muscle mass has decreased by 1.2 lb. Fat mass has increased by 4.2 lb.Total body water has increased by 2.6 lb.   Plan: Begin the  Category 4 meal plan with b/l options and 6 ounces of lean protein at lunch and 10-12 ounces at dinner.   Behavioral Intervention Additional resources provided today: category 4 meal plan information, breakfast options, and lunch options Evidence-based interventions for health behavior change were utilized today including the discussion of self monitoring techniques, problem-solving barriers and SMART goal setting techniques.   Regarding patient's less desirable eating habits and patterns, we employed the technique of small changes.  Pt will specifically work on: begin his prudent nutritional plan for next visit.    Recommended Physical Activity Goals Rembrandt has been advised to gradually work up to 150 minutes of moderate intensity aerobic activity a week and strengthening exercises 2-3 times per week for cardiovascular health, weight loss maintenance and preservation of muscle mass.  He has agreed to continue their current level of activity  FOLLOW UP: Follow up in 2 weeks. He was informed of the importance of frequent follow up visits to maximize his success with intensive lifestyle modifications for his multiple health conditions.  CARMIE RISENHOOVER is aware that we will review all of his lab results at our next visit.  He is aware that if anything is critical/ life threatening with the results, we will be contacting him via MyChart prior to the office visit to discuss management.    Chief Complaint:   OBESITY YOSHIAKI HEWLETT (MR# [id number]) is a 27 y.o. male who presents for evaluation and treatment of obesity and related comorbidities. Current BMI is Body mass index is 63.28 kg/m. Draylen has been struggling with his weight for many years and has been unsuccessful in either losing weight, maintaining weight loss, or reaching his healthy weight goal.  YOUSUF WHACK is currently in the action stage of change and ready to dedicate time achieving and maintaining a healthier weight. Calob is interested  in becoming our patient and working on intensive lifestyle modifications including (but not limited to) diet and exercise for weight loss.  TYWAN WAWRO does not work. Patient is single . He lives with his 37 y.o mother Khai Sigler and 85 y.o grandfather Kriss Difranco .  Paula S Mentor's habits were reviewed today and are as follows:   - He desires to be 210 lbs within 1 yr.   - Reason he identified for gaining weight: "I have a genetic disorder".   - He has never tried any previous weight loss diet plans.   - Eats outside the home 2x per  week.   - He endorses not knowing how to cook. His mother does the grocery shopping and cooks.  - Craves salty foods and snacks on chips.  - He drinks regular soda 2 days a wk and occasionally drinks beer.   - He skips breakfast every day.   - Worst food habit: not eating regularly.   Subjective:   This is the patient's first visit at [facility].  The patient's NEW PATIENT PACKET that they filled out prior to today's office visit was reviewed at length and information from that paperwork was included within the following office visit note.    Included in the packet: current and past health history, medications, allergies, ROS, gynecologic history (women only), surgical history, family history, social history, weight history, weight loss surgery history (for those that have had weight loss surgery), nutritional evaluation, mood and food questionnaire along with a depression screening (PHQ9) on all patients, an Epworth questionnaire, sleep habits questionnaire, patient life and health improvement goals questionnaire. These will all be scanned into the patient's chart under the "media" tab.   Review of Systems: Please refer to new patient packet scanned into media. Pertinent positives were addressed with patient today.  Reviewed by clinician on day of visit: allergies, medications, problem list, medical history, surgical history, family  history, social history, and previous encounter notes.  During the visit, I independently reviewed the patient's EKG, bioimpedance scale results, and indirect calorimeter results. I used this information to tailor a meal plan for the patient that will help Lamonte Sakai to lose weight and will improve his obesity-related conditions going forward.  I performed a medically necessary appropriate examination and/or evaluation. I discussed the assessment and treatment plan with the patient. The patient was provided an opportunity to ask questions and all were answered. The patient agreed with the plan and demonstrated an understanding of the instructions. Labs were ordered today (unless patient declined them) and will be reviewed with the patient at our next visit unless more critical results need to be addressed immediately. Clinical information was updated and documented in the EMR.  Time spent on visit including pre-visit chart review and post-visit care was estimated to be 55  minutes. Over 50% of the time was spent in direct face to face counseling and coordination of care. Objective:   PHYSICAL EXAM: Blood pressure 132/88, pulse 79, temperature 97.8 F (36.6 C), height 5\' 10"  (1.778 m), weight (!) 441 lb (200 kg), SpO2 99 %. Body mass index is 63.28 kg/m. General: Well Developed, well nourished, and in no acute distress.  HEENT: Normocephalic, atraumatic Skin: Warm and dry, cap RF less 2 sec, good turgor Chest:  Normal excursion, shape, no gross abn Respiratory: speaking in full sentences, no conversational dyspnea NeuroM-Sk: Ambulates w/o assistance, moves * 4 Psych: A and O *3, insight good, mood-full  Anthropometric Measurements Height: 5\' 10"  (1.778 m) Weight: (!) 441 lb (200 kg) BMI (Calculated): 63.28 Weight at Last Visit: n/a Weight Lost Since Last Visit: n/a Weight Gained Since Last Visit: n/a Starting Weight: 441lb Peak Weight: 446lb Waist Measurement : 65 inches   Body  Composition  Body Fat %: 48.4 % Fat Mass (lbs): 213.8 lbs Muscle Mass (lbs): 216.8 lbs Total Body Water (lbs): 168 lbs   Other Clinical Data RMR: [id number] Fasting: yes Labs: yes Today's Visit #: first visit Starting Date: 11/19/22    DIAGNOSTIC DATA REVIEWED:  BMET    Component Value Date/Time   NA 140 09/03/2022 0850  NA 140 08/29/2020 1304   K 3.5 09/03/2022 0850   CL 102 09/03/2022 0850   CO2 27 09/03/2022 0850   GLUCOSE 149 (H) 09/03/2022 0850   BUN 13 09/03/2022 0850   BUN 8 08/29/2020 1304   CREATININE 0.65 09/03/2022 0850   CALCIUM 8.8 09/03/2022 0850   GFRNONAA >60 03/22/2020 1320   GFRNONAA 138 01/26/2020 1047   GFRAA 160 01/26/2020 1047   Lab Results  Component Value Date   HGBA1C 7.6 (H) 09/03/2022   HGBA1C 5.9 01/17/2011   No results found for: "INSULIN" Lab Results  Component Value Date   TSH 3.21 09/03/2022   CBC    Component Value Date/Time   WBC 13.2 (H) 09/03/2022 0850   RBC 5.19 09/03/2022 0850   HGB 13.4 09/03/2022 0850   HGB 13.6 01/17/2021 0930   HCT 41.9 09/03/2022 0850   HCT 41.5 01/17/2021 0930   PLT 420 (H) 09/03/2022 0850   PLT 379 01/17/2021 0930   MCV 80.7 09/03/2022 0850   MCV 81 01/17/2021 0930   MCH 25.8 (L) 09/03/2022 0850   MCHC 32.0 09/03/2022 0850   RDW 13.8 09/03/2022 0850   RDW 13.7 01/17/2021 0930   Iron Studies No results found for: "IRON", "TIBC", "FERRITIN", "IRONPCTSAT" Lipid Panel     Component Value Date/Time   CHOL 201 (H) 09/03/2022 0850   CHOL 181 08/29/2020 1304   TRIG 92 09/03/2022 0850   HDL 51 09/03/2022 0850   HDL 38 (L) 08/29/2020 1304   CHOLHDL 3.9 09/03/2022 0850   VLDL 21 08/08/2015 1152   LDLCALC 130 (H) 09/03/2022 0850   Hepatic Function Panel     Component Value Date/Time   PROT 7.1 09/03/2022 0850   PROT 7.6 08/29/2020 1304   ALBUMIN 3.8 (L) 08/29/2020 1304   AST 19 09/03/2022 0850   ALT 22 09/03/2022 0850   ALKPHOS 89 08/29/2020 1304   BILITOT 0.5 09/03/2022 0850   BILITOT  0.3 08/29/2020 1304      Component Value Date/Time   TSH 3.21 09/03/2022 0850   Nutritional Lab Results  Component Value Date   VD25OH 26 (L) 09/03/2022   VD25OH 17 (L) 01/26/2020   VD25OH 17 (L) 09/16/2019    Attestation Statements:   I, Special Puri, acting as a Stage manager for Thomasene Lot, DO., have compiled all relevant documentation for today's office visit on behalf of Thomasene Lot, DO, while in the presence of [employer], DO.  I have reviewed the above documentation for accuracy and completeness, and I agree with the above. [name], D.O.  The 21st Century Cures Act was signed into law in 2016 which includes the topic of electronic health records.  This provides immediate access to information in MyChart.  This includes consultation notes, operative notes, office notes, lab results and pathology reports.  If you have any questions about what you read please let us know at your next visit so we can discuss your concerns and take corrective action if need be.  We are right here with you.

## 2022-11-20 ENCOUNTER — Ambulatory Visit: Payer: Medicaid Other | Admitting: Family Medicine

## 2022-11-20 LAB — LIPID PANEL WITH LDL/HDL RATIO
Cholesterol, Total: 168 mg/dL (ref 100–199)
HDL: 43 mg/dL (ref >39)
LDL Chol Calc (NIH): 101 mg/dL — ABNORMAL HIGH (ref 0–99)
LDL/HDL Ratio: 2.3 ratio (ref 0.0–3.6)
Triglycerides: 135 mg/dL (ref 0–149)
VLDL Cholesterol Cal: 24 mg/dL (ref 5–40)

## 2022-11-20 LAB — CBC WITH DIFFERENTIAL/PLATELET
Basophils Absolute: 0 10*3/uL (ref 0.0–0.2)
Basos: 0 %
EOS (ABSOLUTE): 0.3 10*3/uL (ref 0.0–0.4)
Eos: 2 %
Hematocrit: 41.1 % (ref 37.5–51.0)
Hemoglobin: 13 g/dL (ref 13.0–17.7)
Immature Grans (Abs): 0.1 10*3/uL (ref 0.0–0.1)
Immature Granulocytes: 1 %
Lymphocytes Absolute: 3.7 10*3/uL — ABNORMAL HIGH (ref 0.7–3.1)
Lymphs: 23 %
MCH: 25.9 pg — ABNORMAL LOW (ref 26.6–33.0)
MCHC: 31.6 g/dL (ref 31.5–35.7)
MCV: 82 fL (ref 79–97)
Monocytes Absolute: 0.8 10*3/uL (ref 0.1–0.9)
Monocytes: 5 %
Neutrophils Absolute: 11 10*3/uL — ABNORMAL HIGH (ref 1.4–7.0)
Neutrophils: 69 %
Platelets: 395 10*3/uL (ref 150–450)
RBC: 5.02 x10E6/uL (ref 4.14–5.80)
RDW: 14.8 % (ref 11.6–15.4)
WBC: 15.9 10*3/uL — ABNORMAL HIGH (ref 3.4–10.8)

## 2022-11-20 LAB — HEMOGLOBIN A1C
Est. average glucose Bld gHb Est-mCnc: 171 mg/dL
Hgb A1c MFr Bld: 7.6 % — ABNORMAL HIGH (ref 4.8–5.6)

## 2022-11-20 LAB — COMPREHENSIVE METABOLIC PANEL
ALT: 23 IU/L (ref 0–44)
AST: 17 IU/L (ref 0–40)
Albumin: 3.7 g/dL — ABNORMAL LOW (ref 4.3–5.2)
Alkaline Phosphatase: 93 IU/L (ref 44–121)
BUN/Creatinine Ratio: 26 — ABNORMAL HIGH (ref 9–20)
BUN: 19 mg/dL (ref 6–20)
Bilirubin Total: 0.3 mg/dL (ref 0.0–1.2)
CO2: 25 mmol/L (ref 20–29)
Calcium: 9.1 mg/dL (ref 8.7–10.2)
Chloride: 96 mmol/L (ref 96–106)
Creatinine, Ser: 0.74 mg/dL — ABNORMAL LOW (ref 0.76–1.27)
Globulin, Total: 3.3 g/dL (ref 1.5–4.5)
Glucose: 120 mg/dL — ABNORMAL HIGH (ref 70–99)
Potassium: 3.6 mmol/L (ref 3.5–5.2)
Sodium: 139 mmol/L (ref 134–144)
Total Protein: 7 g/dL (ref 6.0–8.5)
eGFR: 127 mL/min/{1.73_m2} (ref >59)

## 2022-11-20 LAB — VITAMIN B12: Vitamin B-12: 510 pg/mL (ref 232–1245)

## 2022-11-20 LAB — INSULIN, RANDOM: INSULIN: 43.8 u[IU]/mL — ABNORMAL HIGH (ref 2.6–24.9)

## 2022-11-20 LAB — FOLATE: Folate: 11 ng/mL (ref >3.0)

## 2022-11-20 LAB — T4, FREE: Free T4: 1.23 ng/dL (ref 0.82–1.77)

## 2022-11-20 LAB — VITAMIN D 25 HYDROXY (VIT D DEFICIENCY, FRACTURES): Vit D, 25-Hydroxy: 31.1 ng/mL (ref 30.0–100.0)

## 2022-11-20 LAB — TSH: TSH: 3.58 u[IU]/mL (ref 0.450–4.500)

## 2022-12-11 ENCOUNTER — Encounter (INDEPENDENT_AMBULATORY_CARE_PROVIDER_SITE_OTHER): Payer: Self-pay | Admitting: Family Medicine

## 2022-12-11 ENCOUNTER — Ambulatory Visit (INDEPENDENT_AMBULATORY_CARE_PROVIDER_SITE_OTHER): Payer: MEDICAID | Admitting: Family Medicine

## 2022-12-11 VITALS — BP 150/86 | HR 88 | Temp 98.2°F | Ht 70.0 in | Wt >= 6400 oz

## 2022-12-11 DIAGNOSIS — E1169 Type 2 diabetes mellitus with other specified complication: Secondary | ICD-10-CM

## 2022-12-11 DIAGNOSIS — Z6841 Body Mass Index (BMI) 40.0 and over, adult: Secondary | ICD-10-CM

## 2022-12-11 DIAGNOSIS — E1159 Type 2 diabetes mellitus with other circulatory complications: Secondary | ICD-10-CM

## 2022-12-11 DIAGNOSIS — I152 Hypertension secondary to endocrine disorders: Secondary | ICD-10-CM

## 2022-12-11 DIAGNOSIS — E785 Hyperlipidemia, unspecified: Secondary | ICD-10-CM | POA: Diagnosis not present

## 2022-12-11 DIAGNOSIS — E559 Vitamin D deficiency, unspecified: Secondary | ICD-10-CM

## 2022-12-11 DIAGNOSIS — Z7984 Long term (current) use of oral hypoglycemic drugs: Secondary | ICD-10-CM

## 2022-12-11 MED ORDER — VITAMIN D (ERGOCALCIFEROL) 1.25 MG (50000 UNIT) PO CAPS
ORAL_CAPSULE | ORAL | 0 refills | Status: DC
Start: 2022-12-11 — End: 2023-01-02

## 2022-12-11 NOTE — Progress Notes (Signed)
Allen Harding, D.O.  ABFM, ABOM Clinical Bariatric Medicine Physician  Office located at: [address] [place], [place]  [address]     Assessment and Plan:   Medications Discontinued During This Encounter  Medication Reason   Vitamin D, Ergocalciferol, (DRISDOL) 1.25 MG (50000 UNIT) CAPS capsule Reorder     Meds ordered this encounter  Medications   Vitamin D, Ergocalciferol, (DRISDOL) 1.25 MG (50000 UNIT) CAPS capsule    Sig: TAKE 1 CAPSULE THE SAME DAY EACH WEEK.    Dispense:  4 capsule    Refill:  0     Type 2 diabetes mellitus with morbid obesity (HCC) Assessment: Condition is new and  Not optimized. He is taking Metformin 500 mg twice daily with meals and is tolerating it well without any GI upset.   Labs below indicate that: His A1c of 7.6 is consistent within the diabetic range. His fasting insulin levels are almost 9 x the normal limit. His blood counts indicate that he has  slightly elevated WBC. His mom informed me that [facility]. Labs are indicative of stable kidney and liver function, however his BUN/creatinine ratio suggests that he is dehydrated. His folate, B12, and thyroid levels all appear to be within normal limits. Labs were reviewed with pt today and education provided on them and how the foods patient eats may influence these findings. All questions were answered about them.   Lab Results  Component Value Date   HGBA1C 7.6 (H) 11/19/2022   HGBA1C 7.6 (H) 09/03/2022   HGBA1C 6.1 01/23/2021   INSULIN 43.8 (H) 11/19/2022   Lab Results  Component Value Date   WBC 15.9 (H) 11/19/2022   HGB 13.0 11/19/2022   HCT 41.1 11/19/2022   MCV 82 11/19/2022   PLT 395 11/19/2022   Lab Results  Component Value Date   CREATININE 0.74 (L) 11/19/2022   BUN 19 11/19/2022   NA 139 11/19/2022   K 3.6 11/19/2022   CL 96 11/19/2022   CO2 25 11/19/2022      Component Value Date/Time    PROT 7.0 11/19/2022 1019   ALBUMIN 3.7 (L) 11/19/2022 1019   AST 17 11/19/2022 1019   ALT 23 11/19/2022 1019   ALKPHOS 93 11/19/2022 1019   BILITOT 0.3 11/19/2022 1019   Lab Results  Component Value Date   TSH 3.580 11/19/2022   FREET4 1.23 11/19/2022   Lab Results  Component Value Date   FOLATE 11.0 11/19/2022   Lab Results  Component Value Date   VITAMINB12 510 11/19/2022   Plan: Unless pre-existing renal or cardiopulmonary conditions exist which patient was told to limit their fluid intake by another provider, I recommended roughly one half of their weight in pounds, to be the approximate ounces of non-caloric, non-caffeinated beverages they should drink per day; including more if they are engaging in exercise.  Continue Metformin as directed by his PCP. I advised him to call his PCP/endocrinologist to discuss his A1c levels as he went from being  prediabetic to diabetic. I advised them to go to the [place] [employer] website and read up on information about diabetes.Will consider starting a GLP1 in the future for now we will focus on prudent nutritional plan at this time.   I stressed importance of dietary and lifestyle modifications to result in weight loss as first line txmnt. Continue to decrease simple carbs/ sugars; increase fiber and  proteins -> follow his meal plan. Explained role of simple carbs and insulin levels on hunger and cravings. Anticipatory guidance given. We will recheck A1c and fasting insulin level in approximately 3 months from last check, or as deemed appropriate.    Hypertension associated with type 2 diabetes mellitus (HCC) Assessment: Condition is Not optimized. His BP is 161/80 today and upon recheck it was 150/86. He is completely asymptomatic. Denies any chest pain, shortness of breath, and palpitations. He continues to take Metoprolol 75mg  BID, Chlorthalidone 25mg  BID, and Enalapril 10mg  daily. He denies any complications on this.   Last 3  blood pressure readings in our office are as follows: BP Readings from Last 3 Encounters:  12/11/22 (!) 150/86  11/19/22 132/88  10/29/22 (!) 157/86   Plan: Continue Metoprolol, Chlorthalidone, and Enalapril as directed by his PCP and specialist. I discussed with the pt that his blood pressure is not at goal and encouraged him to follow his prudent nutritional plan and low sodium diet more closely.    Continue to avoid buying foods that are: processed, frozen, or prepackaged to avoid excess salt. Ambulatory blood pressure monitoring encouraged.  Reminded patient that if they ever feel poorly in any way, to check their blood pressure and pulse as well. We will continue to monitor closely alongside PCP/ specialists.  Pt reminded to also f/up with those individuals as instructed by them. We will continue to monitor symptoms as they relate to the his weight loss journey.   Vitamin D deficiency Assessment: Condition is improved but not optimized. His vitamin D level is not within the normal range 50-80. He was prescribed Ergocalciferol 50K IU weekly but only notes taking a vitamin D3 800 units daily. Labs were reviewed with pt today and education provided on them and all questions were answered about them.     Lab Results  Component Value Date   VD25OH 31.1 11/19/2022   VD25OH 26 (L) 09/03/2022   VD25OH 17 (L) 01/26/2020   Plan: Start Ergocalciferol 50K IU weekly. I discussed the importance of vitamin D to the patient's health and well-being as well as to their ability to lose weight. Weight loss will likely improve availability of vitamin D, thus encouraged Allen Harding to continue with meal plan and their weight loss efforts to further improve this condition.  Thus, we will need to monitor levels regularly (every 3-4 mo on average) to keep levels within normal limits and prevent over supplementation.   Hyperlipidemia associated with type 2 diabetes mellitus (HCC) Assessment: Condition is not optimized  and is being treated with  Rosuvastatin 5mg  daily without any adverse effects. His triglycerides increased from 92 on 09/03/2022 to 135 on 11/19/2022. His HDL decreased from 51 on 09/03/2022 to 43 on 11/19/2022. His LDL is elevated at 101. I discussed with pt as a diabetic his LDL should be less than 70. Labs were reviewed with pt today and education provided on them and how the foods patient eats may influence these findings. All questions were answered about them.   Lab Results  Component Value Date   CHOL 168 11/19/2022   HDL 43 11/19/2022   LDLCALC 101 (H) 11/19/2022   TRIG 135 11/19/2022   CHOLHDL 3.9 09/03/2022   Plan: Continue Rosuvastatin as prescribed by PCP. Lamonte Sakai agrees to continue with our treatment plan of a heart-heathy, low cholesterol meal plan. I stressed the importance that patient continue with our prudent nutritional plan that is low in saturated and trans fats, and  low in fatty carbs to improve these numbers. We will continue routine screening as patient continues to achieve health goals along their weight loss journey.    TREATMENT PLAN FOR OBESITY: BMI 60.0-69.9, adult (HCC)- current 63.56 Morbid obesity (HCC)-beginning bmi 11/19/22- 63.28 Assessment: Allen Harding is here to discuss his progress with his obesity treatment plan along with follow-up of his obesity related diagnoses. See Medical Weight Management Flowsheet for complete bioelectrical impedance results.  Condition is not optimized. Biometric data collected today, was reviewed with patient.   Since last office visit patient's  Muscle mass has decreased by 7.2lb. Fat mass has increased by 9lb. Counseling done on how various foods will affect these numbers and how to maximize success  Total lbs lost to date: +2 lbs Total weight loss percentage to date: + 0.45%  Plan: Continue the Category 4 meal plan with b/l options and 6 ounces of lean protein at lunch and 10-12 ounces at dinner.   - Pt educated on  the importance of proteins for speeding metabolism, increasing muscle, and controlling hunger and cravings. I also recommended him to use AI software like chat GPT to search new recipe ideas. Furthermore we talked about Allen Harding meals and the factor meal prep which he can try if he desires. I advised him to avoid drinking caloric liquids.  - Reminded Riad that lifestyle changes are the first line treatment option for most all disease processes.  Education provided that "food is medicine" and I encouraged him to be mindful of how certain foods can improve or worsen his medical conditions.     Behavioral Intervention Additional resources provided today: Recipes, slow cooker meal options handout, and general tips for healthy eating choices and weight loss handout. Evidence-based interventions for health behavior change were utilized today including the discussion of self monitoring techniques, problem-solving barriers and SMART goal setting techniques.   Regarding patient's less desirable eating habits and patterns, we employed the technique of small changes.  Pt will specifically work on: measure all of his lean proteins and wokring twoards 2 gallons of water daily for next visit.    Recommended Physical Activity Goals Allen Harding has been advised to slowly work up to 150 minutes of moderate intensity aerobic activity a week and strengthening exercises 2-3 times per week for cardiovascular health, weight loss maintenance and preservation of muscle mass.   He has agreed to Continue current level of physical activity   FOLLOW UP: Return in 2-3 wks. He was informed of the importance of frequent follow up visits to maximize his success with intensive lifestyle modifications for his multiple health conditions.   Subjective:   Chief complaint: Obesity Ferman is here to discuss his progress with his obesity treatment plan. He is on the the Category 4 Plan  with b/l options and 6 ounces of lean protein at lunch  and 10-12 ounces at dinner and states he is following his eating plan approximately 70 % of the time. He states he is walking around the house 5 minutes 7 days per week.  Interval History:  Allen Harding is here today for his first follow-up office visit since starting the program with [place].  Since last OV he states that the plan made him too full and had a hard time eating all the proteins on the meal plan. He states that he did fall off plan occasionally but has been trying to stay hydrated although is drinking less than a gallon of water. He does not measure all  of his protein intake. He ate off plan for a few days while on a trip (gatorade and fatty). He inquired about eating military pre made meals. He has a upcoming appointment with Dr. Dewaine Conger scheduled.   All blood work/ lab tests that were recently ordered by myself or an outside provider were reviewed with patient today per their request. Extended time was spent counseling him on all new disease processes that were discovered or preexisting ones that are affected by BMI.  he understands that many of these abnormalities will need to monitored regularly along with the current treatment plan of prudent dietary changes, in which we are making each and every office visit, to improve these health parameters.  Review of Systems:  Pertinent positives were addressed with patient today. Weight Summary and Biometrics   Weight Lost Since Last Visit: 0lb  Weight Gained Since Last Visit: 2lb   Vitals Temp: 98.2 F (36.8 C) BP: (!) 150/86 Pulse Rate: 88 SpO2: 98 %   Anthropometric Measurements Height: 5\' 10"  (1.778 m) Weight: (!) 443 lb (200.9 kg) BMI (Calculated): 63.56 Weight at Last Visit: 441lb Weight Lost Since Last Visit: 0lb Weight Gained Since Last Visit: 2lb Starting Weight: 441lb Total Weight Loss (lbs): 0 lb (0 kg) Peak Weight: 446lb   Body Composition  Body Fat %: 50.3 % Fat Mass (lbs): 222.8 lbs Muscle Mass (lbs): 209.6  lbs Visceral Fat Rating : 41   Other Clinical Data Fasting: no Labs: no Today's Visit #: 2 Starting Date: 11/19/22   Objective:   PHYSICAL EXAM:  Blood pressure (!) 150/86, pulse 88, temperature 98.2 F (36.8 C), height 5\' 10"  (1.778 m), weight (!) 443 lb (200.9 kg), SpO2 98 %. Body mass index is 63.56 kg/m.  General: Well Developed, well nourished, and in no acute distress.  HEENT: Normocephalic, atraumatic Skin: Warm and dry, cap RF less 2 sec, good turgor Chest:  Normal excursion, shape, no gross abn Respiratory: speaking in full sentences, no conversational dyspnea NeuroM-Sk: Ambulates w/o assistance, moves * 4 Psych: A and O *3, insight good, mood-full  DIAGNOSTIC DATA REVIEWED:  BMET    Component Value Date/Time   NA 139 11/19/2022 1019   K 3.6 11/19/2022 1019   CL 96 11/19/2022 1019   CO2 25 11/19/2022 1019   GLUCOSE 120 (H) 11/19/2022 1019   GLUCOSE 149 (H) 09/03/2022 0850   BUN 19 11/19/2022 1019   CREATININE 0.74 (L) 11/19/2022 1019   CREATININE 0.65 09/03/2022 0850   CALCIUM 9.1 11/19/2022 1019   GFRNONAA >60 03/22/2020 1320   GFRNONAA 138 01/26/2020 1047   GFRAA 160 01/26/2020 1047   Lab Results  Component Value Date   HGBA1C 7.6 (H) 11/19/2022   HGBA1C 5.9 01/17/2011   Lab Results  Component Value Date   INSULIN 43.8 (H) 11/19/2022   Lab Results  Component Value Date   TSH 3.580 11/19/2022   CBC    Component Value Date/Time   WBC 15.9 (H) 11/19/2022 1019   WBC 13.2 (H) 09/03/2022 0850   RBC 5.02 11/19/2022 1019   RBC 5.19 09/03/2022 0850   HGB 13.0 11/19/2022 1019   HCT 41.1 11/19/2022 1019   PLT 395 11/19/2022 1019   MCV 82 11/19/2022 1019   MCH 25.9 (L) 11/19/2022 1019   MCH 25.8 (L) 09/03/2022 0850   MCHC 31.6 11/19/2022 1019   MCHC 32.0 09/03/2022 0850   RDW 14.8 11/19/2022 1019   Iron Studies No results found for: "IRON", "TIBC", "FERRITIN", "IRONPCTSAT" Lipid Panel  Component Value Date/Time   CHOL 168 11/19/2022  1019   TRIG 135 11/19/2022 1019   HDL 43 11/19/2022 1019   CHOLHDL 3.9 09/03/2022 0850   VLDL 21 08/08/2015 1152   LDLCALC 101 (H) 11/19/2022 1019   LDLCALC 130 (H) 09/03/2022 0850   Hepatic Function Panel     Component Value Date/Time   PROT 7.0 11/19/2022 1019   ALBUMIN 3.7 (L) 11/19/2022 1019   AST 17 11/19/2022 1019   ALT 23 11/19/2022 1019   ALKPHOS 93 11/19/2022 1019   BILITOT 0.3 11/19/2022 1019      Component Value Date/Time   TSH 3.580 11/19/2022 1019   Nutritional Lab Results  Component Value Date   VD25OH 31.1 11/19/2022   VD25OH 26 (L) 09/03/2022   VD25OH 17 (L) 01/26/2020    Attestations:   Reviewed by clinician on day of visit: allergies, medications, problem list, medical history, surgical history, family history, social history, and previous encounter notes.   Patient was in the office today and time spent on visit including pre-visit chart review and post-visit care/coordination of care and electronic medical record documentation was 70 minutes. 50% of the time was in face to face counseling of this patient's medical condition(s) and providing education on treatment options to include the first-line treatment of diet and lifestyle modification.  I, Special Randolm Idol, acting as a Stage manager for [employer], DO., have compiled all relevant documentation for today's office visit on behalf of Thomasene Lot, DO, while in the presence of [employer], DO.  I have reviewed the above documentation for accuracy and completeness, and I agree with the above. Allen Harding, D.O.  The 21st Century Cures Act was signed into law in 2016 which includes the topic of electronic health records.  This provides immediate access to information in MyChart.  This includes consultation notes, operative notes, office notes, lab results and pathology reports.  If you have any questions about what you read please let us know at your next visit so we can discuss your concerns  and take corrective action if need be.  We are right here with you.

## 2022-12-17 ENCOUNTER — Ambulatory Visit (INDEPENDENT_AMBULATORY_CARE_PROVIDER_SITE_OTHER): Payer: MEDICAID | Admitting: "Endocrinology

## 2022-12-17 ENCOUNTER — Encounter: Payer: Self-pay | Admitting: "Endocrinology

## 2022-12-17 VITALS — BP 138/80 | HR 92 | Ht 70.0 in | Wt >= 6400 oz

## 2022-12-17 DIAGNOSIS — E291 Testicular hypofunction: Secondary | ICD-10-CM

## 2022-12-17 DIAGNOSIS — I10 Essential (primary) hypertension: Secondary | ICD-10-CM

## 2022-12-17 DIAGNOSIS — E782 Mixed hyperlipidemia: Secondary | ICD-10-CM | POA: Diagnosis not present

## 2022-12-17 DIAGNOSIS — E119 Type 2 diabetes mellitus without complications: Secondary | ICD-10-CM

## 2022-12-17 MED ORDER — TESTOSTERONE CYPIONATE 100 MG/ML IM SOLN: 100.0000 mg | INTRAMUSCULAR | 0 refills | Status: DC

## 2022-12-17 MED ORDER — "SYRINGE/NEEDLE (DISP) 21G X 1-1/2"" 3 ML MISC": 0 refills | Status: DC

## 2022-12-17 MED ORDER — OZEMPIC (0.25 OR 0.5 MG/DOSE) 2 MG/3ML ~~LOC~~ SOPN: 0.2500 mg | PEN_INJECTOR | SUBCUTANEOUS | 0 refills | Status: DC

## 2022-12-17 NOTE — Progress Notes (Signed)
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   Depression    Diabetes mellitus    Fatigue    High cholesterol    Hypertension    Prediabetes    SOB (shortness of breath) on exertion    Trouble in sleeping    Weakness    Past Surgical History:  Procedure Laterality Date   CIRCUMCISION     CIRCUMCISION REVISION     FRENULECTOMY, LINGUAL     lipoma     ORCHIOPEXY     Social History   Socioeconomic History   Marital status: Single    Spouse name: Not on file   Number of children: Not on file   Years of education: Not on file   Highest education level: Not on file  Occupational History   Not on file  Tobacco Use   Smoking status: Former    Current packs/day: 0.00    Types: Cigarettes    Quit date: 01/09/2014    Years since quitting: 8.9   Smokeless tobacco: Never  Vaping Use   Vaping status: Never Used  Substance and Sexual Activity   Alcohol use: No   Drug use: No   Sexual activity: Not on file  Other Topics Concern   Not on file  Social History Narrative   Lives with mom, sister, 2 nieces, grandparents and sister's fiance.    Social Determinants of Health   Financial Resource Strain: Not on file  Food Insecurity: Not on file  Transportation Needs: Not on file  Physical Activity: Not on file  Stress: Not on file  Social Connections: Not on file   Outpatient Encounter Medications as of 12/17/2022  Medication Sig   Semaglutide,0.25 or 0.5MG /DOS, (OZEMPIC, 0.25 OR 0.5 MG/DOSE,) 2 MG/3ML SOPN Inject 0.25 mg into the skin once a week.   SYRINGE-NEEDLE, DISP, 3 ML 21G X 1-1/2" 3 ML MISC Use to inject testosterone every week   testosterone cypionate (DEPOTESTOTERONE  CYPIONATE) 100 MG/ML injection Inject 1 mL (100 mg total) into the muscle every 14 (fourteen) days. For IM use only   chlorthalidone (HYGROTON) 25 MG tablet TAKE 2 TABLETS BY MOUTH DAILY.   enalapril (VASOTEC) 10 MG tablet Take 1 tablet (10 mg total) by mouth daily.   metFORMIN (GLUCOPHAGE) 500 MG tablet Take 1 tablet (500 mg total) by mouth 2 (two) times daily with a meal.   Multiple Vitamin (MULTIVITAMIN) tablet Take 1 tablet by mouth daily.   oxybutynin (DITROPAN-XL) 10 MG 24 hr tablet TAKE 1 TABLET BY MOUTH DAILY.   rosuvastatin (CRESTOR) 5 MG tablet Take 1 tablet (5 mg total) by mouth daily.   Vitamin D, Ergocalciferol, (DRISDOL) 1.25 MG (50000 UNIT) CAPS capsule TAKE 1 CAPSULE THE SAME DAY EACH WEEK.   [DISCONTINUED] Cholecalciferol (VITAMIN D3) 20 MCG (800 UNIT) TABS Take 1 tablet by mouth daily.   [DISCONTINUED] Metoprolol Tartrate 75 MG TABS Take 75 mg by mouth 2 (two) times daily. (Patient not taking: Reported on 12/17/2022)   [DISCONTINUED] testosterone cypionate (DEPOTESTOSTERONE CYPIONATE) 200 MG/ML injection INJECT 0.5ML INTO MUSCLE ONCE WEEKLY. SINGLE USE VIALS. (Patient not taking: Reported on  12/17/2022)   No facility-administered encounter medications on file as of 12/17/2022.   ALLERGIES: No Known Allergies VACCINATION STATUS: Immunization History  Administered Date(s) Administered   DTaP 06/11/1995, 08/06/1995, 09/24/1995, 06/30/1996, 09/24/1999   H1N1 11/24/2008   HIB (PRP-OMP) 06/11/1995, 08/06/1995, 09/24/1995, 06/30/1996   HPV Quadrivalent 02/08/2014   Hepatitis A, Ped/Adol-2 Dose 02/03/2013, 08/19/2013   Hepatitis B 04/01/1995, 05/07/1995, 09/24/1995   IPV 06/11/1995, 08/06/1995, 09/24/1995, 09/24/1999   Influenza Whole 03/22/2009, 06/17/2011, 07/23/2012   Influenza,inj,Quad PF,6+ Mos 05/02/2015, 03/30/2019, 03/30/2020   MMR 03/31/1996, 09/24/1999   Meningococcal Conjugate 11/24/2008, 08/19/2013   Td 01/07/2006   Tdap 01/07/2006   Varicella 03/31/1996, 06/01/1997     Diabetes   27 year old male patient with medical history as above. 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 -He was seen in this clinic until August 2022 and he never returned for follow-up care.  See notes from previous visits.  - He has been following at [facility] until March 2017 with Dr. Vanessa Menlo. -He does not know for sure when he was started on testosterone therapy.   -He has not taken any testosterone for more than a year.  His recent labs show total testosterone at 42.  He also has type 2 diabetes with a higher A1c of 7.6% today.  He wishes to be resumed on testosterone.  He reports fatigue. -He has dealt with heavy weight almost all of his life, admits to dietary indiscretions including consumption of large quantities of sweetened beverages.  He is gaining weight recently.  He remains on metformin 500 mg p.o. twice daily.  He is on some congressional diet program which did not result in weight loss yet.   -He remains on Crestor 5 mg p.o. nightly for dyslipidemia.   -He underwent orchidopexy for right-sided undescended testes. -He denies testicular injury, radiation, infection, STD. -He denies any history of head injury.    Objective:    BP 138/80   Pulse 92   Ht 5\' 10"  (1.778 m)   Wt (!) 444 lb 12.8 oz (201.8 kg)   BMI 63.82 kg/m   Wt Readings from Last 3 Encounters:  12/17/22 (!) 444 lb 12.8 oz (201.8 kg)  12/11/22 (!) 443 lb (200.9 kg)  11/19/22 (!) 441 lb (200 kg)      CMP     Component Value Date/Time   NA 139 11/19/2022 1019   K 3.6 11/19/2022 1019   CL 96 11/19/2022 1019   CO2 25 11/19/2022 1019   GLUCOSE 120 (H) 11/19/2022 1019   GLUCOSE 149 (H) 09/03/2022 0850   BUN 19 11/19/2022 1019   CREATININE 0.74 (L) 11/19/2022 1019   CREATININE 0.65 09/03/2022 0850   CALCIUM 9.1 11/19/2022 1019    PROT 7.0 11/19/2022 1019   ALBUMIN 3.7 (L) 11/19/2022 1019   AST 17 11/19/2022 1019   ALT 23 11/19/2022 1019   ALKPHOS 93 11/19/2022 1019   BILITOT 0.3 11/19/2022 1019   GFRNONAA >60 03/22/2020 1320   GFRNONAA 138 01/26/2020 1047   GFRAA 160 01/26/2020 1047   Diabetic Labs (most recent): Lab Results  Component Value Date   HGBA1C 7.6 (H) 11/19/2022   HGBA1C 7.6 (H) 09/03/2022   HGBA1C 6.1 01/23/2021   MICROALBUR 2.7 08/26/2022   MICROALBUR 11.6 01/26/2020   MICROALBUR 17.4 08/21/2018    Lipid Panel     Component Value Date/Time   CHOL 168 11/19/2022 1019   TRIG  135 11/19/2022 1019   HDL 43 11/19/2022 1019   CHOLHDL 3.9 09/03/2022 0850   VLDL 21 08/08/2015 1152   LDLCALC 101 (H) 11/19/2022 1019   LDLCALC 130 (H) 09/03/2022 0850   Recent Results (from the past 2160 hour(s))  CBC with Differential/Platelet     Status: Abnormal   Collection Time: 11/19/22 10:19 AM  Result Value Ref Range   WBC 15.9 (H) 3.4 - 10.8 x10E3/uL   RBC 5.02 4.14 - 5.80 x10E6/uL   Hemoglobin 13.0 13.0 - 17.7 g/dL   Hematocrit 16.1 09.6 - 51.0 %   MCV 82 79 - 97 fL   MCH 25.9 (L) 26.6 - 33.0 pg   MCHC 31.6 31.5 - 35.7 g/dL   RDW 04.5 40.9 - 81.1 %   Platelets 395 150 - 450 x10E3/uL   Neutrophils 69 Not Estab. %   Lymphs 23 Not Estab. %   Monocytes 5 Not Estab. %   Eos 2 Not Estab. %   Basos 0 Not Estab. %   Neutrophils Absolute 11.0 (H) 1.4 - 7.0 x10E3/uL   Lymphocytes Absolute 3.7 (H) 0.7 - 3.1 x10E3/uL   Monocytes Absolute 0.8 0.1 - 0.9 x10E3/uL   EOS (ABSOLUTE) 0.3 0.0 - 0.4 x10E3/uL   Basophils Absolute 0.0 0.0 - 0.2 x10E3/uL   Immature Granulocytes 1 Not Estab. %   Immature Grans (Abs) 0.1 0.0 - 0.1 x10E3/uL  Comprehensive metabolic panel     Status: Abnormal   Collection Time: 11/19/22 10:19 AM  Result Value Ref Range   Glucose 120 (H) 70 - 99 mg/dL   BUN 19 6 - 20 mg/dL   Creatinine, Ser 9.14 (L) 0.76 - 1.27 mg/dL   eGFR 782 >95 AO/ZHY/8.65   BUN/Creatinine Ratio 26 (H) 9 -  20   Sodium 139 134 - 144 mmol/L   Potassium 3.6 3.5 - 5.2 mmol/L   Chloride 96 96 - 106 mmol/L   CO2 25 20 - 29 mmol/L   Calcium 9.1 8.7 - 10.2 mg/dL   Total Protein 7.0 6.0 - 8.5 g/dL   Albumin 3.7 (L) 4.3 - 5.2 g/dL   Globulin, Total 3.3 1.5 - 4.5 g/dL   Bilirubin Total 0.3 0.0 - 1.2 mg/dL   Alkaline Phosphatase 93 44 - 121 IU/L   AST 17 0 - 40 IU/L   ALT 23 0 - 44 IU/L  Folate     Status: None   Collection Time: 11/19/22 10:19 AM  Result Value Ref Range   Folate 11.0 >3.0 ng/mL    Comment: A serum folate concentration of less than 3.1 ng/mL is considered to represent clinical deficiency.   Hemoglobin A1c     Status: Abnormal   Collection Time: 11/19/22 10:19 AM  Result Value Ref Range   Hgb A1c MFr Bld 7.6 (H) 4.8 - 5.6 %    Comment:          Prediabetes: 5.7 - 6.4          Diabetes: >6.4          Glycemic control for adults with diabetes: <7.0    Est. average glucose Bld gHb Est-mCnc 171 mg/dL  Insulin, random     Status: Abnormal   Collection Time: 11/19/22 10:19 AM  Result Value Ref Range   INSULIN 43.8 (H) 2.6 - 24.9 uIU/mL  Lipid Panel With LDL/HDL Ratio     Status: Abnormal   Collection Time: 11/19/22 10:19 AM  Result Value Ref Range   Cholesterol, Total 168 100 -  199 mg/dL   Triglycerides 098 0 - 149 mg/dL   HDL 43 >11 mg/dL   VLDL Cholesterol Cal 24 5 - 40 mg/dL   LDL Chol Calc (NIH) 914 (H) 0 - 99 mg/dL   LDL/HDL Ratio 2.3 0.0 - 3.6 ratio    Comment:                                     LDL/HDL Ratio                                             Men  Women                               1/2 Avg.Risk  1.0    1.5                                   Avg.Risk  3.6    3.2                                2X Avg.Risk  6.2    5.0                                3X Avg.Risk  8.0    6.1   T4, free     Status: None   Collection Time: 11/19/22 10:19 AM  Result Value Ref Range   Free T4 1.23 0.82 - 1.77 ng/dL  TSH     Status: None   Collection Time: 11/19/22 10:19 AM   Result Value Ref Range   TSH 3.580 0.450 - 4.500 uIU/mL  Vitamin B12     Status: None   Collection Time: 11/19/22 10:19 AM  Result Value Ref Range   Vitamin B-12 510 232 - 1,245 pg/mL  VITAMIN D 25 Hydroxy (Vit-D Deficiency, Fractures)     Status: None   Collection Time: 11/19/22 10:19 AM  Result Value Ref Range   Vit D, 25-Hydroxy 31.1 30.0 - 100.0 ng/mL    Comment: Vitamin D deficiency has been defined by the Institute of Medicine and an Endocrine Society practice guideline as a level of serum 25-OH vitamin D less than 20 ng/mL (1,2). The Endocrine Society went on to further define vitamin D insufficiency as a level between 21 and 29 ng/mL (2). 1. IOM (Institute of Medicine). 2010. Dietary reference    intakes for calcium and D. [place]: The    [employer]. 2. Holick MF, Binkley [place], Bischoff-Ferrari HA, et al.    Evaluation, treatment, and prevention of vitamin D    deficiency: an Endocrine Society clinical practice    guideline. JCEM. 2011 Jul; 96(7):1911-30.      Assessment & Plan:   1.  Type 2 diabetes -new diagnosis  -His point-of-care A1c of 7.6% increasing from 6.1%. In light of his major intolerance dysfunction including morbid obesity, dyslipidemia, sedentary life, he will need additional intervention in order for him to achieve and maintain control of diabetes.  He is advised to continue metformin 500 mg p.o. daily.  He will benefit from GLP-1 receptor agonist.  I discussed and initiated Ozempic 0.25 mg subcutaneously weekly.  This medication will be advanced as he tolerates.   - he acknowledges that there is a room for improvement in his food and drink choices. - Suggestion is made for him to avoid simple carbohydrates  from his diet including Cakes, Sweet Desserts, Ice Cream, Soda (diet and regular), Sweet Tea, Candies, Chips, Cookies, Store Bought Juices, Alcohol in Excess of  1-2 drinks a day, Artificial Sweeteners,  Coffee Creamer, and  "Sugar-free" Products, Lemonade. This will help patient to have more stable blood glucose profile and potentially avoid unintended weight gain.  His current BMI is 63.82-his major medical problem. Whole food plant-based diet was briefly discussed.  2. Hypogonadism  - Etiology most likely multifactorial including chromosomal abnormality and history of undescended testes.  - He has required testosterone supplement for several years now. I have reviewed his EMR records and found out that he had low testosterone starting from at least 2012.  He was not seen in the follow-up visit since 2022.  -I have not seen FSH/LH levels, would have  been useful prior to initiation of testosterone therapy however the utility of that test now is unremarkable. 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  He wishes to be resumed on testosterone supplement treatment.  In light of his target dose around 42, I discussed initiated testosterone 100 mg IM every 14 days to start and will advance as tolerated.   Therapeutic goal is to keep his total testosterone above 250.  -He will need repeat testosterone measurement along with CBC during his next visit. -Safe and proper utility of androgen replacement therapy was discussed with him.   3. Undescended right testicle - He is status post orchidopexy of right testicle. The details of his surgical history are not available for review.  4) hypertension -His blood pressure is controlled to target.  He is advised to continue chlorthalidone 25 mg p.o. daily, enalapril 10 mg p.o. daily.   5) hyperlipidemia  -His most recent lipid panel showed LDL of 101.  This is considered treatment response from previous beat of 120.  He will continue to benefit from Crestor 5 mg p.o. nightly.  Side effects and precautions discussed with him.      He is advised to continue close follow-up with his PMD Dr. Felecia Shelling.    I spent  41  minutes in the care of the patient today including  review of labs from Thyroid Function, CMP, and other relevant labs ; imaging/biopsy records (current and previous including abstractions from other facilities); face-to-face time discussing  his lab results and symptoms, medications doses, his options of short and long term treatment based on the latest standards of care / guidelines;   and documenting the encounter.  Allen Harding  participated in the discussions, expressed understanding, and voiced agreement with the above plans.  All questions were answered to his satisfaction. he is encouraged to contact clinic should he have any questions or concerns prior to his return visit.    Follow up plan: Return in about 3 months (around 03/19/2023) for Fasting Labs  in AM B4 8, A1c -[place].  Marquis Lunch, MD Phone: [phone]  Fax: [phone]   This note was partially dictated with voice recognition software. Similar sounding words can be transcribed inadequately or may not  be corrected upon review.  12/17/2022, 8:18 PM

## 2022-12-17 NOTE — Patient Instructions (Signed)

## 2022-12-24 ENCOUNTER — Telehealth (INDEPENDENT_AMBULATORY_CARE_PROVIDER_SITE_OTHER): Payer: Medicaid Other | Admitting: Psychology

## 2023-01-02 ENCOUNTER — Ambulatory Visit (INDEPENDENT_AMBULATORY_CARE_PROVIDER_SITE_OTHER): Payer: MEDICAID | Admitting: Adult Health

## 2023-01-02 ENCOUNTER — Encounter (INDEPENDENT_AMBULATORY_CARE_PROVIDER_SITE_OTHER): Payer: Self-pay | Admitting: Adult Health

## 2023-01-02 DIAGNOSIS — E1159 Type 2 diabetes mellitus with other circulatory complications: Secondary | ICD-10-CM | POA: Diagnosis not present

## 2023-01-02 DIAGNOSIS — E559 Vitamin D deficiency, unspecified: Secondary | ICD-10-CM | POA: Diagnosis not present

## 2023-01-02 DIAGNOSIS — E1169 Type 2 diabetes mellitus with other specified complication: Secondary | ICD-10-CM

## 2023-01-02 DIAGNOSIS — I152 Hypertension secondary to endocrine disorders: Secondary | ICD-10-CM | POA: Diagnosis not present

## 2023-01-02 DIAGNOSIS — Z6841 Body Mass Index (BMI) 40.0 and over, adult: Secondary | ICD-10-CM

## 2023-01-02 DIAGNOSIS — Z7985 Long-term (current) use of injectable non-insulin antidiabetic drugs: Secondary | ICD-10-CM

## 2023-01-02 MED ORDER — VITAMIN D (ERGOCALCIFEROL) 1.25 MG (50000 UNIT) PO CAPS
ORAL_CAPSULE | ORAL | 0 refills | Status: DC
Start: 2023-01-02 — End: 2023-01-30

## 2023-01-02 NOTE — Progress Notes (Signed)
WEIGHT SUMMARY AND BIOMETRICS  Vitals Temp: 98.1 F (36.7 C) BP: 139/86 Pulse Rate: 65 SpO2: 92 %   Anthropometric Measurements Height: 5\' 10"  (1.778 m) Weight: (!) 431 lb (195.5 kg) BMI (Calculated): 61.84 Weight at Last Visit: 443lb Weight Lost Since Last Visit: 12lb Weight Gained Since Last Visit: 0 Starting Weight: 441lb Total Weight Loss (lbs): 10 lb (4.536 kg) Peak Weight: 446lb Waist Measurement : 65 inches   Body Composition  Body Fat %: 47.4 % Fat Mass (lbs): 204.4 lbs Muscle Mass (lbs): 216 lbs Total Body Water (lbs): 163.8 lbs Visceral Fat Rating : 37   Other Clinical Data Fasting: no Labs: no Today's Visit #: 3 Starting Date: 11/19/22    Chief Complaint:   OBESITY Allen Harding is here to discuss his progress with his obesity treatment plan. He is on the the Category 4 Plan and states he is following his eating plan approximately 80 % of the time. He states he is exercising Walking 10 minutes 5 times per week.   Interim History:  Allen Harding' mother accompanies him to all his medical appt's due to his autism (he appears to be high functioning).  His Endocrinologist/Dr. Fransico Him started him loading dose of Ozempic 0.25mg  on 12/17/2022. He has had 2 doses. Denies mass in neck, dysphagia, dyspepsia, persistent hoarseness, abdominal pain, or N/V/C   Hunger/appetite-he reports significant decreased nighttime craving/grazing sine starting Ozempic 0.25mg  on/about 12/17/2022  Exercise-walking 5 days a week, at least 10 mins  Hydration-he drinks "Circle" flavored water, 3 16.9 oz water bottles  Reviewed Bioempedence results with pt: Muscle Mass: + 6.4 lbs Adipose Mass: - 18.4 lbs  Subjective:   1. Type 2 diabetes mellitus with morbid obesity (HCC) Dr. Tennis Ship recently him on Ozempic therapy- he has had 2 doses of loading dose of 0.25mg  strength. Denies mass in neck, dysphagia, dyspepsia, persistent hoarseness, abdominal pain, or N/V/C  He takes GLP-1  injection on Friday. He has 3 month supply of Ozempic 0.25mg - he will f/u with Dr. Fransico Him in 3 months He is not checking fasting CBG at home. He denies sx's of hypoglycemia.   Latest Reference Range & Units 09/03/22 08:50 11/19/22 10:19  Hemoglobin A1C 4.8 - 5.6 % 7.6 (H) 7.6 (H)  (H): Data is abnormally high  Latest Reference Range & Units 11/19/22 10:19  INSULIN 2.6 - 24.9 uIU/mL 43.8 (H)  (H): Data is abnormally high  2. Hypertension associated with type 2 diabetes mellitus (HCC) BP elevated at OV. He denies CP with exertion, ie: walking. He is on Chlorthalidone 25 mg 2 tabs QD and Enalapril 10mg  QD  3. Vitamin D deficiency  Latest Reference Range & Units 09/03/22 08:50 11/19/22 10:19  Vitamin D, 25-Hydroxy 30.0 - 100.0 ng/mL 26 (L) 31.1  (L): Data is abnormally low 12/11/2022 started on weekly Ergocalciferol- denies N/V/Muscle Weakness  Assessment/Plan:   1. Type 2 diabetes mellitus with morbid obesity (HCC) Continue GLP-1 therapy per Endocro=inology  2. Hypertension associated with type 2 diabetes mellitus (HCC) Continue regular walking and current antihypertensive therapy  3. Vitamin D deficiency Refill - Vitamin D, Ergocalciferol, (DRISDOL) 1.25 MG (50000 UNIT) CAPS capsule; TAKE 1 CAPSULE THE SAME DAY EACH WEEK.  Dispense: 4 capsule; Refill: 0  4. BMI 60.0-69.9, adult (HCC)- current 61.84  Allen Harding is currently in the action stage of change. As such, his goal is to continue with weight loss efforts. He has agreed to the Category 4 Plan.   Exercise goals: All adults should avoid inactivity.  Some physical activity is better than none, and adults who participate in any amount of physical activity gain some health benefits. Adults should also include muscle-strengthening activities that involve all major muscle groups on 2 or more days a week.  Behavioral modification strategies: increasing lean protein intake, decreasing simple carbohydrates, increasing vegetables, increasing  water intake, no skipping meals, meal planning and cooking strategies, keeping healthy foods in the home, better snacking choices, and planning for success.  Allen Harding has agreed to follow-up with our clinic in 2 weeks. He was informed of the importance of frequent follow-up visits to maximize his success with intensive lifestyle modifications for his multiple health conditions.   Objective:   Blood pressure 139/86, pulse 65, temperature 98.1 F (36.7 C), height 5\' 10"  (1.778 m), weight (!) 431 lb (195.5 kg), SpO2 92%. Body mass index is 61.84 kg/m.  General: Cooperative, alert, well developed, in no acute distress. HEENT: Conjunctivae and lids unremarkable. Cardiovascular: Regular rhythm.  Lungs: Normal work of breathing. Neurologic: No focal deficits.   Lab Results  Component Value Date   CREATININE 0.74 (L) 11/19/2022   BUN 19 11/19/2022   NA 139 11/19/2022   K 3.6 11/19/2022   CL 96 11/19/2022   CO2 25 11/19/2022   Lab Results  Component Value Date   ALT 23 11/19/2022   AST 17 11/19/2022   ALKPHOS 93 11/19/2022   BILITOT 0.3 11/19/2022   Lab Results  Component Value Date   HGBA1C 7.6 (H) 11/19/2022   HGBA1C 7.6 (H) 09/03/2022   HGBA1C 6.1 01/23/2021   HGBA1C 6.3 09/04/2020   HGBA1C 6.8 05/05/2020   Lab Results  Component Value Date   INSULIN 43.8 (H) 11/19/2022   Lab Results  Component Value Date   TSH 3.580 11/19/2022   Lab Results  Component Value Date   CHOL 168 11/19/2022   HDL 43 11/19/2022   LDLCALC 101 (H) 11/19/2022   TRIG 135 11/19/2022   CHOLHDL 3.9 09/03/2022   Lab Results  Component Value Date   VD25OH 31.1 11/19/2022   VD25OH 26 (L) 09/03/2022   VD25OH 17 (L) 01/26/2020   Lab Results  Component Value Date   WBC 15.9 (H) 11/19/2022   HGB 13.0 11/19/2022   HCT 41.1 11/19/2022   MCV 82 11/19/2022   PLT 395 11/19/2022   No results found for: "IRON", "TIBC", "FERRITIN"  Attestation Statements:   Reviewed by clinician on day of visit:  allergies, medications, problem list, medical history, surgical history, family history, social history, and previous encounter notes.  I have reviewed the above documentation for accuracy and completeness, and I agree with the above. -  Jerianne Anselmo d. Priscila Bean, NP-C

## 2023-01-13 ENCOUNTER — Ambulatory Visit: Payer: MEDICAID | Admitting: Family Medicine

## 2023-01-13 VITALS — BP 160/76 | HR 121 | Temp 99.0°F | Ht 70.0 in | Wt >= 6400 oz

## 2023-01-13 DIAGNOSIS — I152 Hypertension secondary to endocrine disorders: Secondary | ICD-10-CM | POA: Diagnosis not present

## 2023-01-13 DIAGNOSIS — E1159 Type 2 diabetes mellitus with other circulatory complications: Secondary | ICD-10-CM | POA: Diagnosis not present

## 2023-01-13 MED ORDER — METOPROLOL TARTRATE 75 MG PO TABS: 75.0000 mg | ORAL_TABLET | Freq: Two times a day (BID) | ORAL | 0 refills | Status: DC

## 2023-01-13 NOTE — Assessment & Plan Note (Signed)
BP elevated today in office, he has not taken his medications today. Counseled on importance of medication compliance. Refill provided for Metoprolol 75mg  BID. Encouraged to monitor BP at home and report to office if sustains >140/90. Seek medical care for chest pain, palpitations, vision changes, shortness of breath, recurrent headaches. Return to my office for recheck in 4 weeks.

## 2023-01-13 NOTE — Progress Notes (Signed)
Subjective:  HPI: Allen Harding is a 27 y.o. male presenting on 01/13/2023 for No chief complaint on file.   HPI Patient is in today for medication management. He and his mother are requesting a refill for his Metoprolol Tartrate 75mg  BID. He has been on this medication for 7 years for his heart rate and blood pressure. It was prescribed by his previous PCP. His BP has been well controlled. He has not taken his medications today and has been out of his Metoprolol for over a week. He has been working with [facility] and has improved his diet, medication compliance, and has lost 12 pounds. He is not monitoring his BP at home. Denies chest pain, palpitations, shortness of breath, recurrent headaches, vision changes, swelling of extremities.   Review of Systems  All other systems reviewed and are negative.   Relevant past medical history reviewed and updated as indicated.   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   Current Outpatient Medications:    chlorthalidone (HYGROTON) 25 MG tablet, TAKE 2 TABLETS BY MOUTH DAILY., Disp: 90 tablet, Rfl: 0   enalapril (VASOTEC) 10 MG tablet, Take 1 tablet (10 mg total) by mouth daily., Disp: 30 tablet, Rfl: 0   metFORMIN (GLUCOPHAGE) 500 MG tablet, Take 1 tablet (500 mg total) by mouth 2 (two) times daily with a meal., Disp: 60 tablet, Rfl: 5   Metoprolol Tartrate 75 MG TABS, Take 1 tablet (75 mg total) by mouth 2 (two) times  daily., Disp: 180 tablet, Rfl: 0   Multiple Vitamin (MULTIVITAMIN) tablet, Take 1 tablet by mouth daily., Disp: , Rfl:    oxybutynin (DITROPAN-XL) 10 MG 24 hr tablet, TAKE 1 TABLET BY MOUTH DAILY., Disp: 30 tablet, Rfl: 3   rosuvastatin (CRESTOR) 5 MG tablet, Take 1 tablet (5 mg total) by mouth daily., Disp: 90 tablet, Rfl: 0   Semaglutide,0.25 or 0.5MG /DOS, (OZEMPIC, 0.25 OR 0.5 MG/DOSE,) 2 MG/3ML SOPN, Inject 0.25 mg into the skin once a week., Disp: 3 mL, Rfl: 0   SYRINGE-NEEDLE, DISP, 3 ML 21G X 1-1/2" 3 ML MISC, Use to inject testosterone every week, Disp: 50 each, Rfl: 0   testosterone cypionate (DEPOTESTOTERONE CYPIONATE) 100 MG/ML injection, Inject 1 mL (100 mg total) into the muscle every 14 (fourteen) days. For IM use only, Disp: 10 mL, Rfl: 0   Vitamin D, Ergocalciferol, (DRISDOL) 1.25 MG (50000 UNIT) CAPS capsule, TAKE 1 CAPSULE THE SAME DAY EACH WEEK., Disp: 4 capsule, Rfl: 0  No Known Allergies  Objective:   BP (!) 160/76 Comment: did not take meds this morning  Pulse (!) 121 Comment: been out of heart meds for a week  Temp 99 F (37.2 C) (Oral)   Ht 5\' 10"  (1.778 m)   Wt (!) 440 lb (199.6  kg)   SpO2 97%   BMI 63.13 kg/m      01/13/2023    2:43 PM 01/02/2023    1:56 PM 01/02/2023    1:00 PM  Vitals with BMI  Height 5\' 10"   5\' 10"   Weight 440 lbs  431 lbs  BMI 63.13  61.84  Systolic 160 139 034  Diastolic 76 86 83  Pulse 121  65     Physical Exam Vitals and nursing note reviewed.  Constitutional:      Appearance: Normal appearance. He is normal weight.  HENT:     Head: Normocephalic and atraumatic.  Cardiovascular:     Rate and Rhythm: Regular rhythm. Tachycardia present.     Pulses: Normal pulses.     Heart sounds: Normal heart sounds.  Pulmonary:     Effort: Pulmonary effort is normal.     Breath sounds: Normal breath sounds.  Skin:    General: Skin is warm and dry.     Capillary Refill: Capillary refill takes less than 2 seconds.  Neurological:      General: No focal deficit present.     Mental Status: He is alert and oriented to person, place, and time. Mental status is at baseline.  Psychiatric:        Mood and Affect: Mood normal.        Behavior: Behavior normal.        Thought Content: Thought content normal.        Judgment: Judgment normal.     Assessment & Plan:  Hypertension associated with type 2 diabetes mellitus (HCC) Assessment & Plan: BP elevated today in office, he has not taken his medications today. Counseled on importance of medication compliance. Refill provided for Metoprolol 75mg  BID. Encouraged to monitor BP at home and report to office if sustains >140/90. Seek medical care for chest pain, palpitations, vision changes, shortness of breath, recurrent headaches. Return to my office for recheck in 4 weeks.   Other orders -     Metoprolol Tartrate; Take 1 tablet (75 mg total) by mouth 2 (two) times daily.  Dispense: 180 tablet; Refill: 0     Follow up plan: Return in about 4 weeks (around 02/10/2023) for hypertension.  [name], FNP

## 2023-01-30 ENCOUNTER — Ambulatory Visit (INDEPENDENT_AMBULATORY_CARE_PROVIDER_SITE_OTHER): Payer: MEDICAID | Admitting: Family Medicine

## 2023-01-30 ENCOUNTER — Encounter (INDEPENDENT_AMBULATORY_CARE_PROVIDER_SITE_OTHER): Payer: Self-pay | Admitting: Family Medicine

## 2023-01-30 DIAGNOSIS — E559 Vitamin D deficiency, unspecified: Secondary | ICD-10-CM

## 2023-01-30 DIAGNOSIS — Z6841 Body Mass Index (BMI) 40.0 and over, adult: Secondary | ICD-10-CM

## 2023-01-30 DIAGNOSIS — E1159 Type 2 diabetes mellitus with other circulatory complications: Secondary | ICD-10-CM

## 2023-01-30 DIAGNOSIS — E1169 Type 2 diabetes mellitus with other specified complication: Secondary | ICD-10-CM

## 2023-01-30 DIAGNOSIS — Z7985 Long-term (current) use of injectable non-insulin antidiabetic drugs: Secondary | ICD-10-CM

## 2023-01-30 DIAGNOSIS — I152 Hypertension secondary to endocrine disorders: Secondary | ICD-10-CM

## 2023-01-30 MED ORDER — VITAMIN D (ERGOCALCIFEROL) 1.25 MG (50000 UNIT) PO CAPS
ORAL_CAPSULE | ORAL | 0 refills | Status: DC
Start: 2023-01-30 — End: 2023-03-18

## 2023-01-30 NOTE — Progress Notes (Signed)
[name], D.O.  AB[facility], [facility] Specializing in Clinical Bariatric Medicine  Office located at: [address] [place], [place]  [address]     Assessment and Plan:   Medications Discontinued During This Encounter  Medication Reason   Vitamin D, Ergocalciferol, (DRISDOL) 1.25 MG (50000 UNIT) CAPS capsule Reorder     Meds ordered this encounter  Medications   Vitamin D, Ergocalciferol, (DRISDOL) 1.25 MG (50000 UNIT) CAPS capsule    Sig: TAKE 1 CAPSULE THE SAME DAY EACH WEEK.    Dispense:  4 capsule    Refill:  0    Type 2 diabetes mellitus with morbid obesity (HCC) Assessment & Plan: Lab Results  Component Value Date   HGBA1C 7.6 (H) 11/19/2022   HGBA1C 7.6 (H) 09/03/2022   HGBA1C 6.1 01/23/2021   INSULIN 43.8 (H) 11/19/2022    Started Ozempic 0.25 mg once a wk per Dr.Nida about 3-4 wks ago. Reports that he's tolerating well, denies any N/V/D. Feels that his hunger and cravings are well controlled. Notes doing less snacking.   To further aide with his diabetes & weight loss efforts, we will increase Ozempic to 0.5 mg. Pt has f/up with Endocrinologist on October 16. Continue to decrease simple carbs/ sugars; increase fiber and proteins -> follow his meal plan.     Hypertension associated with type 2 diabetes mellitus (HCC) Assessment & Plan: Last 3 blood pressure readings in our office are as follows: BP Readings from Last 3 Encounters:  01/30/23 (!) 174/118  01/13/23 (!) 160/76  01/02/23 139/86   Blood pressure elevated today - does report eating a very salty meal (BLT sandwich) prior to appointment. BP rechecked by CMA and was unchanged.  He is completely asymptomatic. Denies any chest pain, shortness of breath, and palpitations. HTN treated with Chlorthalidone 25 mg 2 tabs every day, Enalapril 10mg  QD , & Metoprolol 75 mg bid. Pt's mother reports that Allen Harding  was out of his Metoprolol for over a week- had it refilled with PCP on 8/12.   C/w with all  medications per PCP.  Ambulatory blood pressure monitoring encouraged. Pt has been instructed to contact PCP if bp remains elevated like this at home. C/w prudent nutritional plan and low sodium diet, advance exercise as tolerated.    Vitamin D deficiency Assessment & Plan: Lab Results  Component Value Date   VD25OH 31.1 11/19/2022   VD25OH 26 (L) 09/03/2022   VD25OH 17 (L) 01/26/2020   No issues with ERGO 50,000 units once a week. Continue with their weight loss efforts and ERGO at current dose - Will refill today. Will monitor condition expectantly.    BMI 60.0-69.9, adult [facility])- current 61.12 Morbid obesity (HCC)-beginning bmi 11/19/22- 63.28 Assessment & Plan: Since last office visit on 01/02/23 patient's  Muscle mass has decreased by 1.8 lb. Fat mass has decreased by 2.6 lb. Total body water has decreased by 1.8 lb.  Counseling done on how various foods will affect these numbers and how to maximize success  Total lbs lost to date: 15 lbs  Total weight loss percentage to date: 3.40   No change to meal plan - see Subjective  Reminded pt to avoid breads, biscuits, rice, oils, etc when eating out. Eating out guide provided to pt - explained the 10:1 ratio of calories to protein.   Behavioral Intervention Additional resources provided today: category 4 meal plan information, breakfast options, lunch options, and Eating Out Guide Evidence-based interventions for health behavior change were utilized  today including the discussion of self monitoring techniques, problem-solving barriers and SMART goal setting techniques.   Regarding patient's less desirable eating habits and patterns, we employed the technique of small changes.  Pt will specifically work on: continuing with prescribed meal plan & walking 10 minutes qd for next visit.    FOLLOW UP: Return 2-3 wks. He was informed of the importance of frequent follow up visits to maximize his success with intensive lifestyle modifications for  his multiple health conditions.  Subjective:   Chief complaint: Obesity Allen Harding is here to discuss his progress with his obesity treatment plan. He is on the Category 4 Plan with B/L options and states he is following his eating plan approximately 90% of the time. He states he is walking 5 minutes 7 days per week.  Interval History:  Allen Harding is here for a follow up office visit. He is accompanied by his mother. Since last OV,  Allen Harding has been doing well. Endorses being more diligent about his protein intake. Recently started on Ozempic 0.25 mg once a wk per Endocrinologist Dr.Nida.Reports that he's tolerating well, denies any N/V/D. Feels that his hunger and cravings are well controlled. Notes doing less snacking.  Pharmacotherapy for weight loss: He is currently taking  Metformin 500 mg bid & Ozempic 0.25 mg once a week  for medical weight loss.  Denies side effects.    Review of Systems:  Pertinent positives were addressed with patient today.  Reviewed by clinician on day of visit: allergies, medications, problem list, medical history, surgical history, family history, social history, and previous encounter notes.  Weight Summary and Biometrics   Weight Lost Since Last Visit: 5lb  Weight Gained Since Last Visit: 0lb    Vitals Temp: 98.3 F (36.8 C) BP: (!) 174/118 Pulse Rate: 97 SpO2: 100 %   Anthropometric Measurements Height: 5\' 10"  (1.778 m) Weight: (!) 426 lb (193.2 kg) BMI (Calculated): 61.12 Weight at Last Visit: 431lb Weight Lost Since Last Visit: 5lb Weight Gained Since Last Visit: 0lb Starting Weight: 441lb Total Weight Loss (lbs): 15 lb (6.804 kg) Peak Weight: 446lb   Body Composition  Body Fat %: 47.3 % Fat Mass (lbs): 201.8 lbs Muscle Mass (lbs): 214.2 lbs Total Body Water (lbs): 162 lbs Visceral Fat Rating : 37   Other Clinical Data Fasting: no Labs: no Today's Visit #: 4 Starting Date: 11/19/22   Objective:   PHYSICAL EXAM: Blood  pressure (!) 174/118, pulse 97, temperature 98.3 F (36.8 C), height 5\' 10"  (1.778 m), weight (!) 426 lb (193.2 kg), SpO2 100%. Body mass index is 61.12 kg/m.  General: Well Developed, well nourished, and in no acute distress.  HEENT: Normocephalic, atraumatic Skin: Warm and dry, cap RF less 2 sec, good turgor Chest:  Normal excursion, shape, no gross abn Respiratory: speaking in full sentences, no conversational dyspnea NeuroM-Sk: Ambulates w/o assistance, moves * 4 Psych: A and O *3, insight good, mood-full  DIAGNOSTIC DATA REVIEWED:  BMET    Component Value Date/Time   NA 139 11/19/2022 1019   K 3.6 11/19/2022 1019   CL 96 11/19/2022 1019   CO2 25 11/19/2022 1019   GLUCOSE 120 (H) 11/19/2022 1019   GLUCOSE 149 (H) 09/03/2022 0850   BUN 19 11/19/2022 1019   CREATININE 0.74 (L) 11/19/2022 1019   CREATININE 0.65 09/03/2022 0850   CALCIUM 9.1 11/19/2022 1019   GFRNONAA >60 03/22/2020 1320   GFRNONAA 138 01/26/2020 1047   GFRAA 160 01/26/2020 1047   Lab  Results  Component Value Date   HGBA1C 7.6 (H) 11/19/2022   HGBA1C 5.9 01/17/2011   Lab Results  Component Value Date   INSULIN 43.8 (H) 11/19/2022   Lab Results  Component Value Date   TSH 3.580 11/19/2022   CBC    Component Value Date/Time   WBC 15.9 (H) 11/19/2022 1019   WBC 13.2 (H) 09/03/2022 0850   RBC 5.02 11/19/2022 1019   RBC 5.19 09/03/2022 0850   HGB 13.0 11/19/2022 1019   HCT 41.1 11/19/2022 1019   PLT 395 11/19/2022 1019   MCV 82 11/19/2022 1019   MCH 25.9 (L) 11/19/2022 1019   MCH 25.8 (L) 09/03/2022 0850   MCHC 31.6 11/19/2022 1019   MCHC 32.0 09/03/2022 0850   RDW 14.8 11/19/2022 1019   Iron Studies No results found for: "IRON", "TIBC", "FERRITIN", "IRONPCTSAT" Lipid Panel     Component Value Date/Time   CHOL 168 11/19/2022 1019   TRIG 135 11/19/2022 1019   HDL 43 11/19/2022 1019   CHOLHDL 3.9 09/03/2022 0850   VLDL 21 08/08/2015 1152   LDLCALC 101 (H) 11/19/2022 1019   LDLCALC 130  (H) 09/03/2022 0850   Hepatic Function Panel     Component Value Date/Time   PROT 7.0 11/19/2022 1019   ALBUMIN 3.7 (L) 11/19/2022 1019   AST 17 11/19/2022 1019   ALT 23 11/19/2022 1019   ALKPHOS 93 11/19/2022 1019   BILITOT 0.3 11/19/2022 1019      Component Value Date/Time   TSH 3.580 11/19/2022 1019   Nutritional Lab Results  Component Value Date   VD25OH 31.1 11/19/2022   VD25OH 26 (L) 09/03/2022   VD25OH 17 (L) 01/26/2020    Attestations:   Patient was in the office today and time spent on visit including pre-visit chart review and post-visit care/coordination of care and electronic medical record documentation was 40  minutes. 50% of that time was in face to face counseling of this patient's medical condition(s) and providing education on treatment options to always include the first-line treatment of diet and lifestyle modification.  I, Special Randolm Idol, acting as a Stage manager for [employer], DO., have compiled all relevant documentation for today's office visit on behalf of Thomasene Lot, DO, while in the presence of [employer], DO.  I have reviewed the above documentation for accuracy and completeness, and I agree with the above. [name], D.O.  The 21st Century Cures Act was signed into law in 2016 which includes the topic of electronic health records.  This provides immediate access to information in MyChart.  This includes consultation notes, operative notes, office notes, lab results and pathology reports.  If you have any questions about what you read please let us know at your next visit so we can discuss your concerns and take corrective action if need be.  We are right here with you.

## 2023-02-04 ENCOUNTER — Telehealth (INDEPENDENT_AMBULATORY_CARE_PROVIDER_SITE_OTHER): Payer: MEDICAID | Admitting: Psychology

## 2023-02-04 DIAGNOSIS — F909 Attention-deficit hyperactivity disorder, unspecified type: Secondary | ICD-10-CM | POA: Diagnosis not present

## 2023-02-04 DIAGNOSIS — F5089 Other specified eating disorder: Secondary | ICD-10-CM | POA: Diagnosis not present

## 2023-02-04 NOTE — Progress Notes (Signed)
Office: [phone]  /  Fax: [phone]    Date: February 04, 2023    Appointment Start Time: 9:07am Duration: 40 minutes Provider: Lawerance Cruel, Psy.D. Type of Session: Intake for Individual Therapy  Location of Patient: Home (private location) Location of Provider: Provider's home (private office) Type of Contact: Telepsychological Visit via MyChart Video Visit  Informed Consent: This provider called the number provided in Allen Harding's chart as it showed he joined the appointment, but was experiencing challenges connecting with audio/video capabilities. His legal guardian/mother, Sariel Beas, answered the phone, and explained she was not home at the moment and provided [name]'s cell-phone number. As his legal guardian, she provided verbal consent for today's appointment. This provider called [facility] at 9:04am and provided assistance to connect.  As such, today's appointment was initiated 7 minutes late.  Prior to proceeding with today's appointment, two pieces of identifying information were obtained. In addition, Tyshun's physical location at the time of this appointment was obtained as well a phone number he could be reached at in the event of technical difficulties. Osric and this provider participated in today's telepsychological service.   The provider's role was explained to Lamonte Sakai. The provider reviewed and discussed issues of confidentiality, privacy, and limits therein (e.g., reporting obligations). In addition to verbal informed consent, written informed consent for psychological services was obtained prior to the initial appointment. Since the clinic is not a 24/7 crisis center, mental health emergency resources were shared and this  provider explained MyChart, e-mail, voicemail, and/or other messaging systems should be utilized only for non-emergency reasons. This provider also explained that information obtained during appointments will be placed in Blandon's medical record and  relevant information will be shared with other providers at [facility] at any locations for coordination of care. [name] agreed information may be shared with other [facility] providers as needed for coordination of care and by signing the service agreement document, he provided written consent for coordination of care. Prior to initiating telepsychological services, [name] completed an informed consent document, which included the development of a safety plan (i.e., an emergency contact and emergency resources) in the event of an emergency/crisis. [name] verbally acknowledged understanding he is ultimately responsible for understanding his insurance benefits for telepsychological and in-person services. This provider also reviewed confidentiality, as it relates to telepsychological services. [name]  acknowledged understanding that appointments cannot be recorded without both party consent and he is aware he is responsible for securing confidentiality on his end of the session. [name] verbally consented to proceed. Of note, both Niegel and his legal guardian signed the service agreement and telepsychological consent form prior to today's appointment.  Chief Complaint/HPI: [name] was referred by Dr. Thomasene Lot due to mood disorder-emotional eating. Per the note for the visit with Dr. Thomasene Lot on 11/19/2022:  "Assessment: Denies any SI/HI. Mood is stable. Pt endorses having a hx of anxiety and depression. He is not currently taking an mood medications. He also endorses eating when bored.    Plan: Check labs. Patient was referred to Dr. Dewaine Conger, our Bariatric Psychologist, for evaluation due to his struggles with emotional eating.   Reminded patient of the importance of following their prudent nutrition plan and how food can affect mood as well to support emotional wellbeing. We will continue to monitor closely alongside PCP / other specialists."   During today's appointment,  [name] was verbally administered a questionnaire assessing various behaviors related to emotional eating behaviors. [name] endorsed the following: experience  food cravings on a regular basis, find food is comforting to you, and not worry about what you eat when you are in a good mood. He shared he is unsure what he craves. [name] believes the onset of emotional eating behaviors was likely in childhood and described the current frequency of emotional eating behaviors as "few times a week, but not as often as the usual." In addition, [name] endorsed a history of binge eating behaviors, noting he last engaged in binge eating behaviors approximately couple of years ago. [name] denied a history of significantly restricting food intake, purging and engagement in other compensatory strategies for weight loss, and has never been diagnosed with an eating disorder. He also denied a history of treatment for emotional or binge eating behaviors. Currently, [name] indicated [facility] has been helpful, noting he has "lost a fair amount of weight." Furthermore, [name] denied other problems of concern.    Mental Status Examination:  Appearance: neat Behavior: appropriate to circumstances Mood: neutral Affect: mood congruent Speech: WNL Eye Contact: intermittent  Psychomotor Activity: WNL Gait: unable to assess  Thought Process: linear, logical, and goal directed and denies suicidal, homicidal, and self-harm ideation, plan and intent  Thought Content/Perception: no hallucinations, delusions, bizarre thinking or behavior endorsed or observed Orientation: AAOx4 Memory/Concentration: intact Insight/Judgment: fair  Family & Psychosocial History: [name] reported he is not currently in a relationship and he does not have any children. He indicated he is currently in the process of seeking employment. Additionally, [name] shared his highest level of education obtained is a high school diploma. Currently, [name]'s social support system  consists of his mother, maternal grandpa, friends, and sister. Moreover, [name] stated he resides with his mother and grandpa.   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 90 tablet 0   enalapril (VASOTEC) 10 MG tablet Take 1 tablet (10 mg total) by mouth daily. 30 tablet 0   metFORMIN (GLUCOPHAGE) 500 MG tablet Take 1 tablet (500 mg total) by mouth 2 (two) times daily with a meal. 60 tablet 5   Metoprolol Tartrate 75 MG TABS Take 1 tablet (75 mg total) by mouth 2 (two) times daily. 180 tablet 0   Multiple Vitamin (MULTIVITAMIN) tablet Take 1 tablet by mouth daily.     oxybutynin (DITROPAN-XL) 10 MG 24 hr tablet TAKE 1 TABLET BY MOUTH DAILY. 30 tablet 3   rosuvastatin (CRESTOR) 5 MG tablet Take 1 tablet (5 mg total) by mouth daily. 90 tablet 0   Semaglutide,0.25 or 0.5MG /DOS, (OZEMPIC, 0.25 OR 0.5 MG/DOSE,) 2 MG/3ML SOPN Inject 0.25 mg into the skin once a week. 3 mL 0   SYRINGE-NEEDLE, DISP, 3 ML 21G X 1-1/2" 3  ML MISC Use to inject testosterone every week 50 each 0   testosterone cypionate (DEPOTESTOTERONE CYPIONATE) 100 MG/ML injection Inject 1 mL (100 mg total) into the muscle every 14 (fourteen) days. For IM use only 10 mL 0   Vitamin D, Ergocalciferol, (DRISDOL) 1.25 MG (50000 UNIT) CAPS capsule TAKE 1 CAPSULE THE SAME DAY EACH WEEK. 4  capsule 0   No current facility-administered medications on file prior to visit.  Alarik stated he is medication compliant.   Mental Health History: Demareon reported a history of therapeutic services, noting he currently meets with Otelia Limes, [facility]. They meet every other week and the focus of the treatment is "emotions, current events." He stated he is currently not taking any psychotropic medications, but recalled previously taking "ADHD medication." Nealy reported there is no history of hospitalizations for psychiatric concerns. Aadarsh endorsed a family history of depression (mother, sister, maternal grandfather, and maternal cousin). Furthermore, Muhannad reported there is no history of trauma including psychological, physical , and sexual abuse, as well as neglect.   Gariel described his typical mood lately as "not really energetic," adding, "I tend to stay to myself." He noted he currently experiences some concentration issues. He also discussed a history of panic attacks, noting the last time was "a long time ago." Jonerik denied current alcohol use. He denied tobacco use. He denied illicit/recreational substance use. Furthermore, Taquan indicated he is not experiencing the following: hallucinations and delusions, paranoia, symptoms of mania , social withdrawal, crying spells, memory concerns, and obsessions and compulsions. He also denied history of and current suicidal ideation, plan, and intent; history of and current homicidal ideation, plan, and intent; and history of and current engagement in self-harm.  Legal History: Marquis reported there is no history of legal involvement.   Structured Assessments Results: The Patient Health Questionnaire-9 (PHQ-9) is a self-report measure that assesses symptoms and severity of depression over the course of the last two weeks. Ashe obtained a score of 3 suggesting minimal depression. Andreaz finds the endorsed symptoms to be somewhat difficult. [0= Not at  all; 1= Several days; 2= More than half the days; 3= Nearly every day] Little interest or pleasure in doing things 0  Feeling down, depressed, or hopeless 0  Trouble falling or staying asleep, or sleeping too much 0  Feeling tired or having little energy 1  Poor appetite or overeating 0  Feeling bad about yourself --- or that you are a failure or have let yourself or your family down 0  Trouble concentrating on things, such as reading the newspaper or watching television 2  Moving or speaking so slowly that other people could have noticed? Or the opposite --- being so fidgety or restless that you have been moving around a lot more than usual 0  Thoughts that you would be better off dead or hurting yourself in some way 0  PHQ-9 Score 3    The Generalized Anxiety Disorder-7 (GAD-7) is a brief self-report measure that assesses symptoms of anxiety over the course of the last two weeks. Kastle obtained a score of 2 suggesting minimal anxiety. Treighton finds the endorsed symptoms to be somewhat difficult. [0= Not at all; 1= Several days; 2= Over half the days; 3= Nearly every day] Feeling nervous, anxious, on edge 1  Not being able to stop or control worrying 0  Worrying too much about different things 0  Trouble relaxing 0  Being so restless that it's hard to sit still 0  Becoming easily annoyed  or irritable 1  Feeling afraid as if something awful might happen 0  GAD-7 Score 2   Interventions:  Conducted a chart review Focused on rapport building Verbally administered PHQ-9 and GAD-7 for symptom monitoring Verbally administered Food & Mood questionnaire to assess various behaviors related to emotional eating Provided emphatic reflections and validation Psychoeducation provided regarding physical versus emotional hunger  Diagnostic Impressions & Provisional DSM-5 Diagnosis(es): Clyde discussed a history of emotional eating behaviors starting in childhood and described the current frequency as  "few times a week, but not as often as the usual." He also discussed a history of binge eating behaviors, but denied engagement in the past couple years. He also denied engagement in any other disordered eating behaviors. Based on the aforementioned, the following diagnosis was assigned: F50.89 Other Specified Feeding or Eating Disorder, Emotional Eating Behaviors. Per chart review and Tresean' self-report, he was previously diagnosed with ADHD and was prescribed medication. He also endorsed current concentration-related concerns. Based on the aforementioned and the limited scope of this appointment and this provider's role with the clinic, the following diagnosis was assigned: F90.9 Unspecified Attention-Deficit/Hyperactivity Disorder.  Plan: Johah appears able and willing to participate as evidenced by engagement in reciprocal conversation and asking questions as needed for clarification. Based on Rino's schedule and appointment availability, the  next appointment is scheduled for 03/10/2023 at 2pm, which will be via MyChart Video Visit. The following treatment goal was established: increase coping skills. This provider will regularly review the treatment plan and medical chart to keep informed of status changes. Donyell expressed understanding and agreement with the initial treatment plan of care. Aalijah will be sent a handout via e-mail to utilize between now and the next appointment to increase awareness of hunger patterns and subsequent eating. Rayshaun provided verbal consent during today's appointment for this provider to send the handout via e-mail. Juwon will continue with his primary therapist.

## 2023-02-11 ENCOUNTER — Other Ambulatory Visit: Payer: Self-pay | Admitting: "Endocrinology

## 2023-02-13 ENCOUNTER — Ambulatory Visit: Payer: MEDICAID | Admitting: Family Medicine

## 2023-02-24 ENCOUNTER — Other Ambulatory Visit: Payer: Self-pay | Admitting: Family Medicine

## 2023-02-24 DIAGNOSIS — I10 Essential (primary) hypertension: Secondary | ICD-10-CM

## 2023-02-27 ENCOUNTER — Ambulatory Visit (INDEPENDENT_AMBULATORY_CARE_PROVIDER_SITE_OTHER): Payer: MEDICAID | Admitting: Family Medicine

## 2023-02-27 ENCOUNTER — Encounter (INDEPENDENT_AMBULATORY_CARE_PROVIDER_SITE_OTHER): Payer: Self-pay

## 2023-03-01 ENCOUNTER — Encounter: Payer: Self-pay | Admitting: "Endocrinology

## 2023-03-04 ENCOUNTER — Other Ambulatory Visit: Payer: Self-pay

## 2023-03-04 DIAGNOSIS — E119 Type 2 diabetes mellitus without complications: Secondary | ICD-10-CM

## 2023-03-04 MED ORDER — METFORMIN HCL 500 MG PO TABS: 500.0000 mg | ORAL_TABLET | Freq: Every day | ORAL | 0 refills | Status: AC

## 2023-03-10 ENCOUNTER — Telehealth (INDEPENDENT_AMBULATORY_CARE_PROVIDER_SITE_OTHER): Payer: MEDICAID | Admitting: Psychology

## 2023-03-10 DIAGNOSIS — F909 Attention-deficit hyperactivity disorder, unspecified type: Secondary | ICD-10-CM

## 2023-03-10 DIAGNOSIS — F5089 Other specified eating disorder: Secondary | ICD-10-CM

## 2023-03-10 NOTE — Progress Notes (Signed)
  Office: [phone]  /  Fax: [phone]    Date: March 10, 2023  Appointment Start Time: 2:02pm Duration: 17 minutes Provider: Lawerance Cruel, Psy.D. Type of Session: Individual Therapy  Location of Patient: Home (private location) Location of Provider: Provider's Home (private office) Type of Contact: Telepsychological Visit via MyChart Video Visit  Session Content: Allen Harding is a 27 y.o. male presenting for a follow-up appointment to address the previously established treatment goal of increasing coping skills.Today's appointment was a telepsychological visit. Taggart provided verbal consent for today's telepsychological appointment and he is aware he is responsible for securing confidentiality on his end of the session. Prior to proceeding with today's appointment, Shelia's physical location at the time of this appointment was obtained as well a phone number he could be reached at in the event of technical difficulties. [name] and this provider participated in today's telepsychological service.   This provider conducted a brief check-in. [name] stated he is "taking it day by day." He stated there is "nothing new" as it relates to his eating habits. [name] indicated he did not look at the previously shared handout, and requested it be re-sent to another e-mail address. Discussed emotional and physical hunger. Psychoeducation regarding triggers for emotional eating was provided. [name] was provided a handout, and encouraged to utilize the handout between now and the next appointment to increase awareness of triggers and frequency. [name] agreed. This provider also discussed behavioral strategies for specific triggers, such as placing the utensil down when conversing to avoid mindless eating. Taseen provided verbal consent during today's appointment for this provider to send a handout about triggers via e-mail. Overall, [name] was receptive to today's appointment as evidenced by openness to sharing,  responsiveness to feedback, and willingness to explore triggers for emotional eating.  Mental Status Examination:  Appearance: neat Behavior: appropriate to circumstances Mood: neutral Affect: mood congruent Speech: WNL Eye Contact: intermittent Psychomotor Activity: WNL Gait: unable to assess Thought Process: linear, logical, and goal directed and no evidence or endorsement of suicidal, homicidal, and self-harm ideation, plan and intent  Thought Content/Perception: no hallucinations, delusions, bizarre thinking or behavior endorsed or observed Orientation: AAOx4 Memory/Concentration: intact Insight: fair Judgment: fair  Interventions:  Conducted a brief chart review Provided empathic reflections and validation Employed supportive psychotherapy interventions to facilitate reduced distress and to improve coping skills with identified stressors Psychoeducation provided regarding triggers for emotional eating behaviors  DSM-5 Diagnosis(es):  F50.89 Other Specified Feeding or Eating Disorder, Emotional Eating Behaviors and F90.9 Unspecified Attention-Deficit/Hyperactivity Disorder   Treatment Goal & Progress: During the initial appointment with this provider, the following treatment goal was established: increase coping skills. Progress is limited, as [name] has just begun treatment with this provider; however, he is receptive to the interaction and interventions and rapport is being established.   Plan: The next appointment is scheduled for 03/31/2023 at 2:30pm, which will be via MyChart Video Visit. The next session will focus on working towards the established treatment goal.

## 2023-03-18 ENCOUNTER — Encounter (INDEPENDENT_AMBULATORY_CARE_PROVIDER_SITE_OTHER): Payer: Self-pay | Admitting: Adult Health

## 2023-03-18 ENCOUNTER — Ambulatory Visit (INDEPENDENT_AMBULATORY_CARE_PROVIDER_SITE_OTHER): Payer: MEDICAID | Admitting: Adult Health

## 2023-03-18 VITALS — BP 168/92 | HR 100 | Temp 97.5°F | Ht 70.0 in | Wt >= 6400 oz

## 2023-03-18 DIAGNOSIS — E559 Vitamin D deficiency, unspecified: Secondary | ICD-10-CM

## 2023-03-18 DIAGNOSIS — E1169 Type 2 diabetes mellitus with other specified complication: Secondary | ICD-10-CM | POA: Diagnosis not present

## 2023-03-18 DIAGNOSIS — Z7985 Long-term (current) use of injectable non-insulin antidiabetic drugs: Secondary | ICD-10-CM

## 2023-03-18 DIAGNOSIS — I152 Hypertension secondary to endocrine disorders: Secondary | ICD-10-CM | POA: Diagnosis not present

## 2023-03-18 DIAGNOSIS — E1159 Type 2 diabetes mellitus with other circulatory complications: Secondary | ICD-10-CM | POA: Diagnosis not present

## 2023-03-18 DIAGNOSIS — Z6841 Body Mass Index (BMI) 40.0 and over, adult: Secondary | ICD-10-CM

## 2023-03-18 MED ORDER — BLOOD PRESSURE MONITORING KIT: 1.0000 [IU] | PACK | Freq: Every day | 0 refills | Status: AC

## 2023-03-18 MED ORDER — VITAMIN D (ERGOCALCIFEROL) 1.25 MG (50000 UNIT) PO CAPS: ORAL_CAPSULE | ORAL | 0 refills | Status: DC

## 2023-03-18 NOTE — Progress Notes (Signed)
WEIGHT SUMMARY AND BIOMETRICS  Vitals Temp: (!) 97.5 F (36.4 C) BP: (!) 168/92 Pulse Rate: 100 SpO2: 96 %   Anthropometric Measurements Height: 5\' 10"  (1.778 m) Weight: (!) 418 lb (189.6 kg) BMI (Calculated): 59.98 Weight at Last Visit: 426lb Weight Lost Since Last Visit: 8lb Weight Gained Since Last Visit: 0 Starting Weight: 441lb Total Weight Loss (lbs): 23 lb (10.4 kg) Peak Weight: 446lb   Body Composition  Body Fat %: 47.2 % Fat Mass (lbs): 197.6 lbs Muscle Mass (lbs): 210.6 lbs Total Body Water (lbs): 157.2 lbs Visceral Fat Rating : 36   Other Clinical Data Fasting: no Labs: no Today's Visit #: 5 Starting Date: 11/19/22    Chief Complaint:   OBESITY Allen Harding is here to discuss his progress with his obesity treatment plan. He is on the the Category 4 Plan and states he is following his eating plan approximately 50 % of the time. He states he is exercising Walking 30 minutes 2 times per week.   Interim History:  BP ELEVATED He denies acute cardiac sx's at present. He had steak biscuit and hashbrowns for lunch today EPIC review reveals BP above gaol at majority of OVs in  [facility]  Hunger/appetite-appetite suppressed, he reports consuming one large meal/day He is on weekly Ozempic 0.5mg  injection  Sleep- he estimates to sleep 6-7 hours/night  Of note- Mother at [facility] during OV  Subjective:   1. Vitamin D deficiency  Latest Reference Range & Units 09/03/22 08:50 11/19/22 10:19  Vitamin D, 25-Hydroxy 30.0 - 100.0 ng/mL 26 (L) 31.1  (L): Data is abnormally low  He is on weekly Ergocalciferol- denies N/V/Muscle Weakness  2. Hypertension associated with type 2 diabetes mellitus (HCC) BP UNCONTROLLED He estimates to be taking ALL antihypertensive therapy perhaps 60-70% of the time. He reports taking Chlorathalidone 25mg , Enalapril 10mg , and Metoprolol Tartrate 75mg  this mid morning. He had steak biscuit and hashbrowns for lunch today He  vehemently denies CP, dyspnea, HA, or change in vision. He denies tobacco or vape use  3. Type 2 diabetes mellitus with morbid obesity (HCC) He increased Ozempic from 0.25mg  to 0.5mg  on/about 01/30/2023 He injects on Fridays He has had 7 doses of increased GLP-1 therapy Denies mass in neck, dysphagia, dyspepsia, persistent hoarseness, abdominal pain, or N/V/C   He is not checking CBG at home and denies sx's of hypoglycemia  He reports eating only one large meal a day  Assessment/Plan:   1. Vitamin D deficiency Refill Vitamin D, Ergocalciferol, (DRISDOL) 1.25 MG (50000 UNIT) CAPS capsule TAKE 1 CAPSULE THE SAME DAY EACH WEEK. Dispense: 4 capsule, Refills: 0 ordered   2. Hypertension associated with type 2 diabetes mellitus (HCC) Discussed Red Flag sx's and if ANY develop to seek immediate medical assistance. Pt and pt's mother verbalized understanding and agreement CALL PCP TODAY TO SCHEDULE F/U TO ADDRESS UNCONTROLLED HTN Check BP at home and bring log to PCP Refill Blood Pressure Monitoring KIT 1 Units by Does not apply route daily. Dispense: 1 kit, Refills: 0 ordered   LARGE UPPER ARM CUFF   Staff Message sent to PCP with update  3. Type 2 diabetes mellitus with morbid obesity (HCC) Continue weekly Ozempic 0.5mg  injection Due to suppressed appetite- do not recommend increasing Eat small meals every few hours  4. Morbid Obesity, Current BMI 59.98  Allen Harding is not currently in the action stage of change. As such, his goal is to get back to weightloss efforts . He has agreed  to the Category 4 Plan.   Exercise goals: All adults should avoid inactivity. Some physical activity is better than none, and adults who participate in any amount of physical activity gain some health benefits. Adults should also include muscle-strengthening activities that involve all major muscle groups on 2 or more days a week.  Behavioral modification strategies: increasing lean protein intake, decreasing  simple carbohydrates, increasing vegetables, increasing water intake, decreasing liquid calories, decreasing sodium intake, increasing high fiber foods, decreasing eating out, no skipping meals, meal planning and cooking strategies, keeping healthy foods in the home, ways to avoid boredom eating, ways to avoid night time snacking, and planning for success.  Allen Harding has agreed to follow-up with our clinic in 4 weeks. He was informed of the importance of frequent follow-up visits to maximize his success with intensive lifestyle modifications for his multiple health conditions.   Objective:   Blood pressure (!) 168/92, pulse 100, temperature (!) 97.5 F (36.4 C), height 5\' 10"  (1.778 m), weight (!) 418 lb (189.6 kg), SpO2 96%. Body mass index is 59.98 kg/m.  General: Cooperative, alert, well developed, in no acute distress. HEENT: Conjunctivae and lids unremarkable. Cardiovascular: Regular rhythm.  Lungs: Normal work of breathing. Neurologic: No focal deficits.   Lab Results  Component Value Date   CREATININE 0.74 (L) 11/19/2022   BUN 19 11/19/2022   NA 139 11/19/2022   K 3.6 11/19/2022   CL 96 11/19/2022   CO2 25 11/19/2022   Lab Results  Component Value Date   ALT 23 11/19/2022   AST 17 11/19/2022   ALKPHOS 93 11/19/2022   BILITOT 0.3 11/19/2022   Lab Results  Component Value Date   HGBA1C 7.6 (H) 11/19/2022   HGBA1C 7.6 (H) 09/03/2022   HGBA1C 6.1 01/23/2021   HGBA1C 6.3 09/04/2020   HGBA1C 6.8 05/05/2020   Lab Results  Component Value Date   INSULIN 43.8 (H) 11/19/2022   Lab Results  Component Value Date   TSH 3.580 11/19/2022   Lab Results  Component Value Date   CHOL 168 11/19/2022   HDL 43 11/19/2022   LDLCALC 101 (H) 11/19/2022   TRIG 135 11/19/2022   CHOLHDL 3.9 09/03/2022   Lab Results  Component Value Date   VD25OH 31.1 11/19/2022   VD25OH 26 (L) 09/03/2022   VD25OH 17 (L) 01/26/2020   Lab Results  Component Value Date   WBC 15.9 (H) 11/19/2022    HGB 13.0 11/19/2022   HCT 41.1 11/19/2022   MCV 82 11/19/2022   PLT 395 11/19/2022   No results found for: "IRON", "TIBC", "FERRITIN"   Attestation Statements:   Reviewed by clinician on day of visit: allergies, medications, problem list, medical history, surgical history, family history, social history, and previous encounter notes.  I have reviewed the above documentation for accuracy and completeness, and I agree with the above. -  Lesleigh Hughson d. Yancy Hascall, NP-C

## 2023-03-19 ENCOUNTER — Ambulatory Visit: Payer: MEDICAID | Admitting: "Endocrinology

## 2023-03-26 ENCOUNTER — Encounter: Payer: Self-pay | Admitting: Family Medicine

## 2023-03-26 ENCOUNTER — Ambulatory Visit: Payer: MEDICAID | Admitting: Family Medicine

## 2023-03-26 VITALS — BP 142/80 | HR 88 | Temp 97.9°F | Ht 70.0 in | Wt >= 6400 oz

## 2023-03-26 DIAGNOSIS — Z23 Encounter for immunization: Secondary | ICD-10-CM

## 2023-03-26 DIAGNOSIS — I10 Essential (primary) hypertension: Secondary | ICD-10-CM | POA: Diagnosis not present

## 2023-03-26 NOTE — Progress Notes (Signed)
Subjective:  HPI: Allen Harding is a 27 y.o. male presenting on 03/26/2023 for No chief complaint on file.   HPI Patient is in today for blood pressure follow up. He was not taking his Enalapril due to being misplaced, he restarted this today. His mother does help with his medications, she placed them weekly in a pill container for him. His BP at home has been well controlled, it was 137/91 this AM.   HYPERTENSION without Chronic Kidney Disease Hypertension status: controlled  Satisfied with current treatment? yes Duration of hypertension: chronic BP monitoring frequency:  daily BP range: not sure BP medication side effects:  no Medication compliance: fair compliance Previous BP meds:chlorthalidone, enalapril Aspirin: no Recurrent headaches: no Visual changes: no Palpitations: no Dyspnea: no Chest pain: no Lower extremity edema: no Dizzy/lightheaded: no    Review of Systems  All other systems reviewed and are negative.   Relevant past medical history reviewed and updated as indicated.   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   Current Outpatient Medications:    Blood Pressure Monitoring KIT, 1 Units by Does not apply route daily., Disp: 1 kit, Rfl: 0   chlorthalidone (HYGROTON) 25 MG tablet, TAKE 2 TABLETS BY MOUTH DAILY., Disp: 90 tablet, Rfl: 0   enalapril (VASOTEC) 10 MG tablet, TAKE ONE TABLET BY MOUTH ONCE DAILY., Disp: 30  tablet, Rfl: 0   metFORMIN (GLUCOPHAGE) 500 MG tablet, Take 1 tablet (500 mg total) by mouth daily with breakfast., Disp: 90 tablet, Rfl: 0   Metoprolol Tartrate 75 MG TABS, Take 1 tablet (75 mg total) by mouth 2 (two) times daily., Disp: 180 tablet, Rfl: 0   Multiple Vitamin (MULTIVITAMIN) tablet, Take 1 tablet by mouth daily., Disp: , Rfl:    oxybutynin (DITROPAN-XL) 10 MG 24 hr tablet, TAKE 1 TABLET BY MOUTH DAILY., Disp: 30 tablet, Rfl: 3   rosuvastatin (CRESTOR) 5 MG tablet, Take 1 tablet (5 mg total) by mouth daily., Disp: 90 tablet, Rfl: 0   Semaglutide,0.25 or 0.5MG /DOS, (OZEMPIC, 0.25 OR 0.5 MG/DOSE,) 2 MG/3ML SOPN, Inject 0.5 mg into the skin once a week., Disp: 3 mL, Rfl: 2   SYRINGE-NEEDLE, DISP, 3 ML 21G X 1-1/2" 3 ML MISC, Use to inject testosterone every week, Disp: 50 each, Rfl: 0   testosterone cypionate (DEPOTESTOTERONE CYPIONATE) 100 MG/ML injection, Inject 1 mL (100 mg total) into the muscle every 14 (fourteen) days. For IM use only, Disp: 10 mL, Rfl: 0   Vitamin D, Ergocalciferol, (DRISDOL) 1.25 MG (50000 UNIT) CAPS capsule, TAKE 1 CAPSULE THE SAME DAY EACH WEEK., Disp: 4 capsule, Rfl: 0  No Known Allergies  Objective:   BP (!) 142/80   Pulse 88   Temp 97.9 F (36.6 C) (Oral)   Ht 5\' 10"  (1.778  m)   Wt (!) 423 lb (191.9 kg)   SpO2 98%   BMI 60.69 kg/m      03/26/2023    2:37 PM 03/18/2023    2:20 PM 03/18/2023    2:00 PM  Vitals with BMI  Height 5\' 10"   5\' 10"   Weight 423 lbs  418 lbs  BMI 60.69  59.98  Systolic 142 168 161  Diastolic 80 92 126  Pulse 88  096     Physical Exam Vitals and nursing note reviewed.  Constitutional:      Appearance: Normal appearance. He is obese.  HENT:     Head: Normocephalic and atraumatic.  Cardiovascular:     Rate and Rhythm: Normal rate and regular rhythm.     Pulses: Normal pulses.     Heart sounds: Normal heart sounds.  Pulmonary:     Effort: Pulmonary effort is normal.     Breath sounds: Normal breath sounds.   Skin:    General: Skin is warm and dry.     Capillary Refill: Capillary refill takes less than 2 seconds.  Neurological:     General: No focal deficit present.     Mental Status: He is alert and oriented to person, place, and time. Mental status is at baseline.  Psychiatric:        Mood and Affect: Mood normal.        Behavior: Behavior normal.        Thought Content: Thought content normal.        Judgment: Judgment normal.     Assessment & Plan:  Essential hypertension, benign Assessment & Plan: Uncontrolled. Counseled on medication compliance and lifestyle modifications. His mother is helping him with medication management and they report recently losing his Enalapril for some time. Will consult pharmacy for help with compliance as well as HTN clinic. Encouraged heart healthy diet and exercise 150 minutes weekly. Counseled on risks of weight and comorbid conditions. Instructed to seek medical care for chest pain, palpitations, shortness of breath, recurrent headaches, or vision changes. Pharmacy chronic care management referral placed for help with medication adherence. Follow up in 1 month for BP.   Orders: -     Ambulatory referral to [facility] -     AMB Referral [facility] Care Management     Follow up plan: Return in about 5 months (around 08/24/2023) for annual physical with labs 1 week prior.  [name], FNP

## 2023-03-26 NOTE — Addendum Note (Signed)
Addended by: Arta Silence on: 03/26/2023 04:35 PM   Modules accepted: Orders

## 2023-03-26 NOTE — Assessment & Plan Note (Signed)
Uncontrolled. Counseled on medication compliance and lifestyle modifications. His mother is helping him with medication management and they report recently losing his Enalapril for some time. Will consult pharmacy for help with compliance as well as HTN clinic. Encouraged heart healthy diet and exercise 150 minutes weekly. Counseled on risks of weight and comorbid conditions. Instructed to seek medical care for chest pain, palpitations, shortness of breath, recurrent headaches, or vision changes. Pharmacy chronic care management referral placed for help with medication adherence. Follow up in 1 month for BP.

## 2023-03-26 NOTE — Progress Notes (Signed)
Flu/boostrix was given per Coline,CMA(orientee)

## 2023-03-27 ENCOUNTER — Telehealth: Payer: Self-pay

## 2023-03-27 NOTE — Progress Notes (Signed)
   Care Guide Note  03/27/2023 Name: Allen Harding MRN: [id number] DOB: 01/06/1996  Referred by: [name], FNP Reason for referral : Care Coordination (Outreach to schedule with Pharm d )   Allen Harding is a 27 y.o. year old male who is a primary care patient of [name], FNP. Allen Harding was referred to the pharmacist for assistance related to HTN and DM.    Successful contact was made with the patient to discuss pharmacy services including being ready for the pharmacist to call at least 5 minutes before the scheduled appointment time, to have medication bottles and any blood sugar or blood pressure readings ready for review. The patient agreed to meet with the pharmacist via with the pharmacist via telephone visit on (date/time).  04/16/2023  Penne Lash, RMA Care Guide [facility]  [place], [place] [address] Direct Dial: [phone] [email or web address]

## 2023-03-30 ENCOUNTER — Encounter (INDEPENDENT_AMBULATORY_CARE_PROVIDER_SITE_OTHER): Payer: Self-pay

## 2023-03-31 ENCOUNTER — Telehealth (INDEPENDENT_AMBULATORY_CARE_PROVIDER_SITE_OTHER): Payer: MEDICAID | Admitting: Psychology

## 2023-04-09 ENCOUNTER — Encounter (INDEPENDENT_AMBULATORY_CARE_PROVIDER_SITE_OTHER): Payer: Self-pay | Admitting: Adult Health

## 2023-04-09 ENCOUNTER — Ambulatory Visit (INDEPENDENT_AMBULATORY_CARE_PROVIDER_SITE_OTHER): Payer: MEDICAID | Admitting: Adult Health

## 2023-04-09 DIAGNOSIS — Z7985 Long-term (current) use of injectable non-insulin antidiabetic drugs: Secondary | ICD-10-CM

## 2023-04-09 DIAGNOSIS — Z6841 Body Mass Index (BMI) 40.0 and over, adult: Secondary | ICD-10-CM | POA: Diagnosis not present

## 2023-04-09 DIAGNOSIS — E1169 Type 2 diabetes mellitus with other specified complication: Secondary | ICD-10-CM

## 2023-04-09 DIAGNOSIS — E559 Vitamin D deficiency, unspecified: Secondary | ICD-10-CM

## 2023-04-09 DIAGNOSIS — Z7984 Long term (current) use of oral hypoglycemic drugs: Secondary | ICD-10-CM

## 2023-04-09 MED ORDER — VITAMIN D (ERGOCALCIFEROL) 1.25 MG (50000 UNIT) PO CAPS: ORAL_CAPSULE | ORAL | 0 refills | Status: DC

## 2023-04-09 NOTE — Progress Notes (Signed)
WEIGHT SUMMARY AND BIOMETRICS  Vitals Temp: 98.3 F (36.8 C) BP: (!) 139/94 Pulse Rate: (!) 101 SpO2: 95 %   Anthropometric Measurements Height: 5\' 10"  (1.778 m) Weight: (!) 413 lb (187.3 kg) BMI (Calculated): 59.26 Weight at Last Visit: 418 lb Weight Lost Since Last Visit: 5 lb Weight Gained Since Last Visit: 0 Starting Weight: 441 lb Total Weight Loss (lbs): 28 lb (12.7 kg) Peak Weight: 446 lb   Body Composition  Body Fat %: 46.1 % Fat Mass (lbs): 190.4 lbs Muscle Mass (lbs): 212 lbs Total Body Water (lbs): 155.4 lbs Visceral Fat Rating : 35   Other Clinical Data Fasting: no Labs: no Today's Visit #: 6 Starting Date: 11/19/22    Chief Complaint:   OBESITY Allen Harding is here to discuss his progress with his obesity treatment plan. He is on the the Category 4 Plan and states he is following his eating plan approximately 50 % of the time. He states he is exercising walking 10-20 minutes 2-3 times per week.   Interim History  Exercise-walking 10-20 mins, 2-3 x week  Reviewed Bioimpedance results with pt: Muscle Mass: +1.4 lbs Adipose Mass: -7.2 lbs  Of note- Mother at [facility] during OV- she provides significant amount of pt information  Subjective:   1. Vitamin D deficiency  Latest Reference Range & Units 09/03/22 08:50 11/19/22 10:19  Vitamin D, 25-Hydroxy 30.0 - 100.0 ng/mL 26 (L) 31.1  (L): Data is abnormally low  He reports daytime fatigue He is on weekly Ergocalciferol- denies N/V/Muscle Weakness  2. Type 2 diabetes mellitus with morbid obesity (HCC) Lab Results  Component Value Date   HGBA1C 7.6 (H) 11/19/2022   HGBA1C 7.6 (H) 09/03/2022   HGBA1C 6.1 01/23/2021    He is on daily Metformin 500mg  and weekly Ozempic 0.5mg - both managed by PCP Denies mass in neck, dysphagia, dyspepsia, persistent hoarseness, abdominal pain, or N/V/C   Assessment/Plan:   1. Vitamin D deficiency Refill - Vitamin D, Ergocalciferol, (DRISDOL) 1.25 MG (50000  UNIT) CAPS capsule; TAKE 1 CAPSULE THE SAME DAY EACH WEEK.  Dispense: 4 capsule; Refill: 0  2. Type 2 diabetes mellitus with morbid obesity (HCC) Increase protein at meals and snacks Continue daily Metformin 500mg  and weekly Ozempic 0.5mg - both managed by PCP  3. BMI 60.0-69.9, adult (HCC)- current BMI 59.3  Wane is currently in the action stage of change. As such, his goal is to continue with weight loss efforts. He has agreed to the Category 4 Plan.   Exercise goals: All adults should avoid inactivity. Some physical activity is better than none, and adults who participate in any amount of physical activity gain some health benefits. Adults should also include muscle-strengthening activities that involve all major muscle groups on 2 or more days a week.  Behavioral modification strategies: increasing lean protein intake, decreasing simple carbohydrates, increasing vegetables, increasing water intake, no skipping meals, meal planning and cooking strategies, keeping healthy foods in the home, planning for success, and decreasing junk food.  Eathen has agreed to follow-up with our clinic in 4 weeks. He was informed of the importance of frequent follow-up visits to maximize his success with intensive lifestyle modifications for his multiple health conditions. .  Objective:   Blood pressure (!) 139/94, pulse (!) 101, temperature 98.3 F (36.8 C), height 5\' 10"  (1.778 m), weight (!) 413 lb (187.3 kg), SpO2 95%. Body mass index is 59.26 kg/m.  General: Cooperative, alert, well developed, in no acute distress. HEENT: Conjunctivae and  lids unremarkable. Cardiovascular: Regular rhythm.  Lungs: Normal work of breathing. Neurologic: No focal deficits.   Lab Results  Component Value Date   CREATININE 0.74 (L) 11/19/2022   BUN 19 11/19/2022   NA 139 11/19/2022   K 3.6 11/19/2022   CL 96 11/19/2022   CO2 25 11/19/2022   Lab Results  Component Value Date   ALT 23 11/19/2022   AST 17  11/19/2022   ALKPHOS 93 11/19/2022   BILITOT 0.3 11/19/2022   Lab Results  Component Value Date   HGBA1C 7.6 (H) 11/19/2022   HGBA1C 7.6 (H) 09/03/2022   HGBA1C 6.1 01/23/2021   HGBA1C 6.3 09/04/2020   HGBA1C 6.8 05/05/2020   Lab Results  Component Value Date   INSULIN 43.8 (H) 11/19/2022   Lab Results  Component Value Date   TSH 3.580 11/19/2022   Lab Results  Component Value Date   CHOL 168 11/19/2022   HDL 43 11/19/2022   LDLCALC 101 (H) 11/19/2022   TRIG 135 11/19/2022   CHOLHDL 3.9 09/03/2022   Lab Results  Component Value Date   VD25OH 31.1 11/19/2022   VD25OH 26 (L) 09/03/2022   VD25OH 17 (L) 01/26/2020   Lab Results  Component Value Date   WBC 15.9 (H) 11/19/2022   HGB 13.0 11/19/2022   HCT 41.1 11/19/2022   MCV 82 11/19/2022   PLT 395 11/19/2022   No results found for: "IRON", "TIBC", "FERRITIN"   Attestation Statements:   Reviewed by clinician on day of visit: allergies, medications, problem list, medical history, surgical history, family history, social history, and previous encounter notes.  I have reviewed the above documentation for accuracy and completeness, and I agree with the above. -  Rabecka Brendel d. Cleo Santucci, NP-C

## 2023-04-16 ENCOUNTER — Telehealth: Payer: Self-pay | Admitting: Pharmacist

## 2023-04-16 ENCOUNTER — Other Ambulatory Visit: Payer: MEDICAID | Admitting: Pharmacist

## 2023-04-16 ENCOUNTER — Other Ambulatory Visit: Payer: Self-pay

## 2023-04-16 DIAGNOSIS — E119 Type 2 diabetes mellitus without complications: Secondary | ICD-10-CM

## 2023-04-16 DIAGNOSIS — D72829 Elevated white blood cell count, unspecified: Secondary | ICD-10-CM

## 2023-04-16 MED ORDER — ACCU-CHEK GUIDE W/DEVICE KIT
PACK | 1 refills | Status: AC
Start: 2023-04-16 — End: ?

## 2023-04-16 MED ORDER — ACCU-CHEK SOFTCLIX LANCETS MISC: 5 refills | Status: AC

## 2023-04-16 MED ORDER — ACCU-CHEK GUIDE VI STRP: ORAL_STRIP | 5 refills | Status: AC

## 2023-04-16 NOTE — Progress Notes (Signed)
04/16/2023 Name: Allen Harding MRN: [id number] DOB: 09/07/1995  Chief Complaint  Patient presents with   Diabetes         Allen Harding is a 27 y.o. year old male who presented for a telephone visit.   They were referred to the pharmacist by their PCP for assistance in managing diabetes and complex medication management.  Patient's mom has guardianship and is able to help manage medications.  She is interested in pill packaging for her son to organize his medications.  Subjective:  Care Team: Primary Care Provider: [name], FNP   Medication Access/Adherence  Current Pharmacy:  [facility] - [place], [place] - [address] [place] [address] Phone: [phone] Fax: [phone]  Patient reports affordability concerns with their medications: No  Patient reports access/transportation concerns to their pharmacy: No  Patient reports adherence concerns with their medications:  Yes would like pill packaging   BP 130/92 most recently at home  Reviewed all medications with patient's mom: Chlorthalidone 25mg  -- 2 tabs in AM enalapril 10mg  daily AM Metformin 500mg  daily Metoprolol 75mg  BID Oxybutynin 10mg  daily Rosuvastatin 5mg  daily Vitamin D on Fridays Loratidine daily needs rx [facility] daily  Diabetes:  Current medications: ozempic 0.5mg  weekly, metformin 500mg  daily Medications tried in the past: n/a --recent diagnosis  Current glucose readings: not checking Reports no current glucometer  Seeing healthy weight and wellness for dietary/weightloss counseling  Current medication access support: medicaid  Objective:  Lab Results  Component Value Date   HGBA1C 7.6 (H) 11/19/2022    Lab Results  Component Value Date   CREATININE 0.74 (L) 11/19/2022   BUN 19 11/19/2022   NA 139 11/19/2022   K 3.6 11/19/2022   CL 96 11/19/2022   CO2 25 11/19/2022    Lab Results  Component Value Date   CHOL 168 11/19/2022   HDL 43  11/19/2022   LDLCALC 101 (H) 11/19/2022   TRIG 135 11/19/2022   CHOLHDL 3.9 09/03/2022    Medications Reviewed Today     Reviewed by Danella Maiers, [facility] (Pharmacist) on 04/16/23 at 1130  Med List Status: <None>   Medication Order Taking? Sig Documenting Provider Last Dose Status Informant  Blood Pressure Monitoring KIT [id number] No 1 Units by Does not apply route daily. William Hamburger D, NP Taking Active   chlorthalidone (HYGROTON) 25 MG tablet [id number] No TAKE 2 TABLETS BY MOUTH DAILY. [name], FNP Taking Active   enalapril (VASOTEC) 10 MG tablet [id number] No TAKE ONE TABLET BY MOUTH ONCE DAILY. [name], FNP Taking Active   metFORMIN (GLUCOPHAGE) 500 MG tablet [id number] No Take 1 tablet (500 mg total) by mouth daily with breakfast. Roma Kayser, MD Taking Active   Metoprolol Tartrate 75 MG TABS [id number] No Take 1 tablet (75 mg total) by mouth 2 (two) times daily. [name], FNP Taking Expired 04/13/23 2359   Multiple Vitamin (MULTIVITAMIN) tablet [id number] No Take 1 tablet by mouth daily. [provider] Taking Active   oxybutynin (DITROPAN-XL) 10 MG 24 hr tablet [id number] No TAKE 1 TABLET BY MOUTH DAILY. [name], FNP Taking Active   rosuvastatin (CRESTOR) 5 MG tablet [id number] No Take 1 tablet (5 mg total) by mouth daily. [name], FNP Taking Active   Semaglutide,0.25 or 0.5MG /DOS, (OZEMPIC, 0.25 OR 0.5 MG/DOSE,) 2 MG/3ML SOPN [id number] No Inject 0.5 mg into the skin once a week. Roma Kayser, MD Taking Active  SYRINGE-NEEDLE, DISP, 3 ML 21G X 1-1/2" 3 ML MISC [id number] No Use to inject testosterone every week Roma Kayser, MD Taking Active   testosterone cypionate (DEPOTESTOTERONE CYPIONATE) 100 MG/ML injection [id number] No Inject 1 mL (100 mg total) into the muscle every 14 (fourteen) days. For IM use only Nida, Denman George, MD Taking Active   Vitamin D, Ergocalciferol, (DRISDOL) 1.25 MG (50000 UNIT) CAPS  capsule [id number]  TAKE 1 CAPSULE THE SAME DAY EACH WEEK. William Hamburger D, NP  Active              Assessment/Plan:   Diabetes: - Reviewed long term cardiovascular and renal outcomes of uncontrolled blood sugar - Reviewed goal A1c, goal fasting, and goal 2 hour post prandial glucose - Recommend to continue current medications - Recommend to check glucose daily (fasting) or if symptomatic  Accucheck guide meter called in (not checking at home)  Reviewed goals FBG<130 -Patient is already using [facility]--will follow up with pharmacy to determine when Dispill would be eligible based on insurance   Follow Up Plan: next week   Kieth Brightly, PharmD, BCACP, CPP Clinical Pharmacist, [facility]

## 2023-04-16 NOTE — Telephone Encounter (Signed)
    04/16/2023 Name: CHANZ NEISWONGER MRN: [id number] DOB: April 03, 1996   Unsuccessful outreach to patient today.  Will follow up to have patient rescheduled.  Kieth Brightly, PharmD, BCACP, CPP Clinical Pharmacist, [facility]

## 2023-04-17 ENCOUNTER — Inpatient Hospital Stay: Payer: MEDICAID

## 2023-04-21 ENCOUNTER — Other Ambulatory Visit: Payer: Self-pay | Admitting: Family Medicine

## 2023-04-21 MED ORDER — METOPROLOL TARTRATE 75 MG PO TABS: 75.0000 mg | ORAL_TABLET | Freq: Two times a day (BID) | ORAL | 0 refills | Status: DC

## 2023-04-24 ENCOUNTER — Inpatient Hospital Stay: Payer: MEDICAID

## 2023-04-24 ENCOUNTER — Inpatient Hospital Stay: Payer: MEDICAID | Attending: Hematology | Admitting: Oncology

## 2023-04-24 VITALS — BP 191/120 | HR 110 | Temp 97.0°F | Resp 20 | Wt >= 6400 oz

## 2023-04-24 DIAGNOSIS — Z87891 Personal history of nicotine dependence: Secondary | ICD-10-CM | POA: Insufficient documentation

## 2023-04-24 DIAGNOSIS — D72829 Elevated white blood cell count, unspecified: Secondary | ICD-10-CM

## 2023-04-24 DIAGNOSIS — D75839 Thrombocytosis, unspecified: Secondary | ICD-10-CM | POA: Insufficient documentation

## 2023-04-24 DIAGNOSIS — D72828 Other elevated white blood cell count: Secondary | ICD-10-CM | POA: Insufficient documentation

## 2023-04-24 DIAGNOSIS — Z7985 Long-term (current) use of injectable non-insulin antidiabetic drugs: Secondary | ICD-10-CM | POA: Insufficient documentation

## 2023-04-24 LAB — CBC WITH DIFFERENTIAL/PLATELET
Abs Immature Granulocytes: 0.09 10*3/uL — ABNORMAL HIGH (ref 0.00–0.07)
Basophils Absolute: 0.1 10*3/uL (ref 0.0–0.1)
Basophils Relative: 0 %
Eosinophils Absolute: 0.3 10*3/uL (ref 0.0–0.5)
Eosinophils Relative: 2 %
HCT: 44.8 % (ref 39.0–52.0)
Hemoglobin: 14.1 g/dL (ref 13.0–17.0)
Immature Granulocytes: 1 %
Lymphocytes Relative: 27 %
Lymphs Abs: 5 10*3/uL — ABNORMAL HIGH (ref 0.7–4.0)
MCH: 25.7 pg — ABNORMAL LOW (ref 26.0–34.0)
MCHC: 31.5 g/dL (ref 30.0–36.0)
MCV: 81.6 fL (ref 80.0–100.0)
Monocytes Absolute: 1 10*3/uL (ref 0.1–1.0)
Monocytes Relative: 6 %
Neutro Abs: 12 10*3/uL — ABNORMAL HIGH (ref 1.7–7.7)
Neutrophils Relative %: 64 %
Platelets: 437 10*3/uL — ABNORMAL HIGH (ref 150–400)
RBC: 5.49 MIL/uL (ref 4.22–5.81)
RDW: 15.1 % (ref 11.5–15.5)
WBC: 18.5 10*3/uL — ABNORMAL HIGH (ref 4.0–10.5)
nRBC: 0 % (ref 0.0–0.2)

## 2023-04-24 LAB — LACTATE DEHYDROGENASE: LDH: 152 U/L (ref 98–192)

## 2023-04-24 NOTE — Progress Notes (Signed)
[place] [address], [place] [address]   CLINIC:  Medical Oncology/Hematology  PCP:  Allen Meo, FNP [address] [place] [address]  [phone]  REASON FOR VISIT:  Follow-up for neutrophilic leukocytosis and thrombocytosis  PRIOR THERAPY: none  CURRENT THERAPY: surveillance  INTERVAL HISTORY:  Allen Harding, a 27 y.o. male, seen for follow-up of leukocytosis.  Denies any fevers, night sweats or unintentional weight loss in the last 1 year.  Energy levels are 50%.  Appetite 100%.  He presents today with his father.  Reports he is grandfather passed away on May 22, 2023.  Had it to get him up after his appointment today.   Denies any recurrent infections systemic steroid use.  Has been losing weight on Ozempic.  REVIEW OF SYSTEMS:  Review of Systems  Constitutional:  Negative for chills, diaphoresis, fatigue and fever.  Psychiatric/Behavioral:  Positive for sleep disturbance.     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   Depression    Diabetes mellitus    Fatigue    High cholesterol    Hypertension    Prediabetes    SOB (shortness of breath) on exertion    Trouble in sleeping    Weakness    Past Surgical History:  Procedure Laterality Date   CIRCUMCISION     CIRCUMCISION REVISION     FRENULECTOMY, LINGUAL     lipoma     ORCHIOPEXY      SOCIAL HISTORY:  Social History   Socioeconomic History   Marital status: Single    Spouse name: Not on file   Number of children: Not on file   Years of education: Not on file   Highest education level: 12th grade  Occupational History   Not on file  Tobacco Use   Smoking status: Former    Current packs/day: 0.00    Types: Cigarettes    Quit date: 01/09/2014    Years since quitting: 9.3    Smokeless tobacco: Never  Vaping Use   Vaping status: Never Used  Substance and Sexual Activity   Alcohol use: No   Drug use: No   Sexual activity: Not on file  Other Topics Concern   Not on file  Social History Narrative   Lives with mom, sister, 2 nieces, grandparents and sister's fiance.    Social Determinants of Health   Financial Resource Strain: Low Risk  (01/08/2023)   Overall Financial Resource Strain (CARDIA)    Difficulty of Paying Living Expenses: Not hard at all  Food Insecurity: No Food Insecurity (03/24/2023)   Hunger Vital Sign    Worried About Running Out of Food in the Last Year: Never true    Ran Out of Food in the Last Year: Never true  Transportation Needs: No Transportation Needs (03/24/2023)   PRAPARE - Administrator, Civil Service (Medical): No    Lack of Transportation (Non-Medical): No  Physical Activity: Insufficiently Active (03/24/2023)   Exercise Vital Sign    Days of Exercise per Week: 2 days    Minutes of Exercise per Session: 10 min  Stress: No Stress Concern Present (03/24/2023)   [employer] of Occupational Health - Occupational Stress Questionnaire    Feeling of Stress :  Only a little  Social Connections: Moderately Integrated (03/24/2023)   Social Connection and Isolation Panel [NHANES]    Frequency of Communication with Friends and Family: More than three times a week    Frequency of Social Gatherings with Friends and Family: More than three times a week    Attends Religious Services: More than 4 times per year    Active Member of [employer] or Organizations: Yes    Attends Engineer, structural: More than 4 times per year    Marital Status: Never married  Catering manager Violence: Not on file    FAMILY HISTORY:  Family History  Problem Relation Age of Onset   Obesity Mother    Diabetes Mother    Hypertension Mother    Hyperlipidemia Mother    Depression Mother    Obesity Sister    Obesity Maternal  Grandmother    Diabetes Maternal Grandmother    Cancer Maternal Grandmother    Stroke Maternal Grandmother    Obesity Maternal Grandfather     CURRENT MEDICATIONS:  Current Outpatient Medications  Medication Sig Dispense Refill   Accu-Chek Softclix Lancets lancets Use as directed to check glucose daily. DX: E11.65 200 each 5   Blood Glucose Monitoring Suppl (ACCU-CHEK GUIDE) w/Device KIT Use as directed to check glucose daily. DX: E11.65 1 kit 1   Blood Pressure Monitoring KIT 1 Units by Does not apply route daily. 1 kit 0   chlorthalidone (HYGROTON) 25 MG tablet TAKE 2 TABLETS BY MOUTH DAILY. 90 tablet 0   enalapril (VASOTEC) 10 MG tablet TAKE ONE TABLET BY MOUTH ONCE DAILY. 30 tablet 0   glucose blood (ACCU-CHEK GUIDE) test strip Use as directed to check glucose daily. DX: E11.65 200 each 5   metFORMIN (GLUCOPHAGE) 500 MG tablet Take 1 tablet (500 mg total) by mouth daily with breakfast. 90 tablet 0   Metoprolol Tartrate 75 MG TABS Take 1 tablet (75 mg total) by mouth 2 (two) times daily. 180 tablet 0   Multiple Vitamin (MULTIVITAMIN) tablet Take 1 tablet by mouth daily.     oxybutynin (DITROPAN-XL) 10 MG 24 hr tablet TAKE 1 TABLET BY MOUTH DAILY. 30 tablet 3   rosuvastatin (CRESTOR) 5 MG tablet Take 1 tablet (5 mg total) by mouth daily. 90 tablet 0   Semaglutide,0.25 or 0.5MG /DOS, (OZEMPIC, 0.25 OR 0.5 MG/DOSE,) 2 MG/3ML SOPN Inject 0.5 mg into the skin once a week. 3 mL 2   SYRINGE-NEEDLE, DISP, 3 ML 21G X 1-1/2" 3 ML MISC Use to inject testosterone every week 50 each 0   testosterone cypionate (DEPOTESTOTERONE CYPIONATE) 100 MG/ML injection Inject 1 mL (100 mg total) into the muscle every 14 (fourteen) days. For IM use only 10 mL 0   Vitamin D, Ergocalciferol, (DRISDOL) 1.25 MG (50000 UNIT) CAPS capsule TAKE 1 CAPSULE THE SAME DAY EACH WEEK. 4 capsule 0   No current facility-administered medications for this visit.    ALLERGIES:  No Known Allergies  PHYSICAL EXAM:  Performance  status (ECOG): 1 - Symptomatic but completely ambulatory  Vitals:   04/24/23 1357  BP: (!) 191/120  Pulse: (!) 110  Resp: 20  Temp: (!) 97 F (36.1 C)  SpO2: 100%   Wt Readings from Last 3 Encounters:  04/24/23 (!) 422 lb 9.6 oz (191.7 kg)  04/09/23 (!) 413 lb (187.3 kg)  03/26/23 (!) 423 lb (191.9 kg)   Physical Exam Constitutional:      Appearance: Normal appearance. He is obese.  Cardiovascular:  Rate and Rhythm: Normal rate and regular rhythm.  Pulmonary:     Effort: Pulmonary effort is normal.     Breath sounds: Normal breath sounds.  Abdominal:     General: Bowel sounds are normal.     Palpations: Abdomen is soft.  Musculoskeletal:        General: No swelling. Normal range of motion.  Neurological:     Mental Status: He is alert and oriented to person, place, and time. Mental status is at baseline.     LABORATORY DATA:  I have reviewed the labs as listed.     Latest Ref Rng & Units 04/24/2023    1:43 PM 11/19/2022   10:19 AM 09/03/2022    8:50 AM  CBC  WBC 4.0 - 10.5 K/uL 18.5  15.9  13.2   Hemoglobin 13.0 - 17.0 g/dL 78.2  95.6  21.3   Hematocrit 39.0 - 52.0 % 44.8  41.1  41.9   Platelets 150 - 400 K/uL 437  395  420       Latest Ref Rng & Units 11/19/2022   10:19 AM 09/03/2022    8:50 AM 08/29/2020    1:04 PM  CMP  Glucose 70 - 99 mg/dL 086  578  469   BUN 6 - 20 mg/dL 19  13  8    Creatinine 0.76 - 1.27 mg/dL 6.29  5.28  4.13   Sodium 134 - 144 mmol/L 139  140  140   Potassium 3.5 - 5.2 mmol/L 3.6  3.5  4.0   Chloride 96 - 106 mmol/L 96  102  101   CO2 20 - 29 mmol/L 25  27  24    Calcium 8.7 - 10.2 mg/dL 9.1  8.8  9.1   Total Protein 6.0 - 8.5 g/dL 7.0  7.1  7.6   Total Bilirubin 0.0 - 1.2 mg/dL 0.3  0.5  0.3   Alkaline Phos 44 - 121 IU/L 93   89   AST 0 - 40 IU/L 17  19  23    ALT 0 - 44 IU/L 23  22  34       Component Value Date/Time   RBC 5.49 04/24/2023 1343   MCV 81.6 04/24/2023 1343   MCV 82 11/19/2022 1019   MCH 25.7 (L) 04/24/2023  1343   MCHC 31.5 04/24/2023 1343   RDW 15.1 04/24/2023 1343   RDW 14.8 11/19/2022 1019   LYMPHSABS 5.0 (H) 04/24/2023 1343   LYMPHSABS 3.7 (H) 11/19/2022 1019   MONOABS 1.0 04/24/2023 1343   EOSABS 0.3 04/24/2023 1343   EOSABS 0.3 11/19/2022 1019   BASOSABS 0.1 04/24/2023 1343   BASOSABS 0.0 11/19/2022 1019    DIAGNOSTIC IMAGING:  I have independently reviewed the scans and discussed with the patient. No results found.   ASSESSMENT:  1.  Neutrophilic leukocytosis: -Previously tested for BCR/ABL and Jak 2 V6 8F mutation and was negative. -He is a non-smoker. -Denies any skin infections, dental infections.  Denies any steroid use.  Denies any history of splenectomy. -Denies any fevers, night sweats or weight loss. -Ultrasound of the abdomen on 03/24/2017 showed normal-sized spleen.  Hepatic steatosis.   2.  Thrombocytosis: -He has history of mildly elevated platelet count for many years.   PLAN:  1.  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 - No infections in the last 1 year. Allergy like symptoms. - Reviewed labs from 04/24/2023 which show white blood cell count of 18.5 (  15.9), hemoglobin 14.1 platelet count 437.  ANC 12.0 (11.0) and elevation in absolute lymphocyte count 5.0. -LDH is normal. -We discussed his white blood cell count has slowly trended up over the past year, observation versus bone marrow biopsy.  Given the death of his grandfather, he would like to hold off on bone marrow and repeat labs in a few months.  If it continues to trend up, we discussed bone marrow biopsy at that time.  2.  Thrombocytosis: - Previously elevated platelet count has been normal last few times. - The count has been entered for years.  Most recent platelet count from 04/24/2023 is 437.  3.  Intentional weight loss: -Currently on Ozempic. -She has been on Ozempic for approximately 2 months and lost a little over 30. -Tolerating  well.  PLAN SUMMARY: >> We discussed lab only 3 months and labs and visit in 6 months.  Labs a few days before visit. >> If white count continues to trend up, would recommend bone marrow biopsy at that time.    I spent 25 minutes dedicated to the care of this patient (face-to-face and non-face-to-face) on the date of the encounter to include what is described in the assessment and plan.  Orders placed this encounter:  No orders of the defined types were placed in this encounter.  Durenda Hurt, NP 04/28/2023 2:29 PM

## 2023-04-27 ENCOUNTER — Other Ambulatory Visit (INDEPENDENT_AMBULATORY_CARE_PROVIDER_SITE_OTHER): Payer: Self-pay | Admitting: Adult Health

## 2023-04-27 DIAGNOSIS — E559 Vitamin D deficiency, unspecified: Secondary | ICD-10-CM

## 2023-04-28 ENCOUNTER — Telehealth (INDEPENDENT_AMBULATORY_CARE_PROVIDER_SITE_OTHER): Payer: MEDICAID | Admitting: Psychology

## 2023-05-05 ENCOUNTER — Other Ambulatory Visit: Payer: Self-pay | Admitting: "Endocrinology

## 2023-05-07 ENCOUNTER — Ambulatory Visit (INDEPENDENT_AMBULATORY_CARE_PROVIDER_SITE_OTHER): Payer: MEDICAID | Admitting: Adult Health

## 2023-05-07 ENCOUNTER — Encounter (INDEPENDENT_AMBULATORY_CARE_PROVIDER_SITE_OTHER): Payer: Self-pay | Admitting: Adult Health

## 2023-05-07 DIAGNOSIS — Z7984 Long term (current) use of oral hypoglycemic drugs: Secondary | ICD-10-CM

## 2023-05-07 DIAGNOSIS — E785 Hyperlipidemia, unspecified: Secondary | ICD-10-CM | POA: Diagnosis not present

## 2023-05-07 DIAGNOSIS — E669 Obesity, unspecified: Secondary | ICD-10-CM

## 2023-05-07 DIAGNOSIS — E1169 Type 2 diabetes mellitus with other specified complication: Secondary | ICD-10-CM

## 2023-05-07 DIAGNOSIS — Z6841 Body Mass Index (BMI) 40.0 and over, adult: Secondary | ICD-10-CM

## 2023-05-07 DIAGNOSIS — F4321 Adjustment disorder with depressed mood: Secondary | ICD-10-CM

## 2023-05-07 DIAGNOSIS — E559 Vitamin D deficiency, unspecified: Secondary | ICD-10-CM

## 2023-05-07 DIAGNOSIS — Z7985 Long-term (current) use of injectable non-insulin antidiabetic drugs: Secondary | ICD-10-CM

## 2023-05-07 MED ORDER — VITAMIN D (ERGOCALCIFEROL) 1.25 MG (50000 UNIT) PO CAPS: ORAL_CAPSULE | ORAL | 0 refills | Status: DC

## 2023-05-07 NOTE — Progress Notes (Signed)
WEIGHT SUMMARY AND BIOMETRICS  Vitals Temp: 97.6 F (36.4 C) BP: (!) 180/105 Pulse Rate: 91 SpO2: 99 %   Anthropometric Measurements Height: 5\' 10"  (1.778 m) Weight: (!) 413 lb (187.3 kg) BMI (Calculated): 59.26 Weight at Last Visit: 413lb Weight Lost Since Last Visit: 0 Weight Gained Since Last Visit: 0 Starting Weight: 441lb Total Weight Loss (lbs): 28 lb (12.7 kg) Peak Weight: 446lb   Body Composition  Body Fat %: 46.5 % Fat Mass (lbs): 192.4 lbs Muscle Mass (lbs): 210.6 lbs Total Body Water (lbs): 153 lbs Visceral Fat Rating : 35   Other Clinical Data Fasting: no Labs: no Today's Visit #: 7 Starting Date: 11/19/22    Chief Complaint:   OBESITY [place] is here to discuss his progress with his obesity treatment plan. He is on the the Category 4 Plan and states he is following his eating plan approximately 25 % of the time. He states he is not currently exercising.   Interim History:  [name]' paternal grandfather suddenly passed away at home. He and his mother lived with him. He was 18 years old. Services were held on 04/28/2023  They are now dealing with his estate and will likely need to move in the near future. He and his mother both receive SSI as only means of financial support.  Of Note- Patient's mom has guardianship and is able to help manage medications and medical care. Mother accompanies him to all medical appts  Subjective:   1. Vitamin D deficiency  Latest Reference Range & Units 09/03/22 08:50 11/19/22 10:19  Vitamin D, 25-Hydroxy 30.0 - 100.0 ng/mL 26 (L) 31.1  (L): Data is abnormally low  He is on weekly Ergocalciferol- denies N/V/Muscle Weakness  2. Type 2 diabetes mellitus with morbid obesity (HCC) Lab Results  Component Value Date   HGBA1C 7.6 (H) 11/19/2022   HGBA1C 7.6 (H) 09/03/2022   HGBA1C 6.1 01/23/2021    Appears that [facility]/Dr. Fransico Him has taken responsibility of antidiabetic regime. Last refill of Metformin  500mg  daily and weekly Ozempic 0.5mg  were sent in from Dr. Fransico Him He is not checking his CBG at home He denies sx's of hypoglycemia  3. Hyperlipidemia associated with type 2 diabetes mellitus (HCC) Lipid Panel     Component Value Date/Time   CHOL 168 11/19/2022 1019   TRIG 135 11/19/2022 1019   HDL 43 11/19/2022 1019   CHOLHDL 3.9 09/03/2022 0850   VLDL 21 08/08/2015 1152   LDLCALC 101 (H) 11/19/2022 1019   LDLCALC 130 (H) 09/03/2022 0850   LABVLDL 24 11/19/2022 1019   The ASCVD Risk score (Arnett DK, et al., 2019) failed to calculate for the following reasons:   The 2019 ASCVD risk score is only valid for ages 32 to 18   He is on daily Crestor 5mg  and weekly Ozempic 0.5mg   BP elevated at OV He denies CP or dyspnea He denies tobacco/vape use  4. Grief  Mr. Bettin' paternal grandfather suddenly passed away at home. He and his mother lived with him. He was 68 years old. Services were held on 04/28/2023  They are now dealing with his estate and will likely need to move in the near future. He and his mother both receive SSI as only means of financial support.  He endorses sadness, vehemently denies SI/HI  Assessment/Plan:   1. Vitamin D deficiency Refill Vitamin D, Ergocalciferol, (DRISDOL) 1.25 MG (50000 UNIT) CAPS capsule TAKE 1 CAPSULE THE SAME DAY EACH WEEK. Dispense: 4 capsule,  Refills: 0 ordered   2. Type 2 diabetes mellitus with morbid obesity (HCC) Continue rosuvastatin (CRESTOR) 5 MG tablet  enalapril (VASOTEC) 10 MG tablet  metFORMIN (GLUCOPHAGE) 500 MG tablet  Blood Glucose Monitoring Suppl (ACCU-CHEK GUIDE) w/Device KIT  Semaglutide,0.25 or 0.5MG /DOS, (OZEMPIC, 0.25 OR 0.5 MG/DOSE,) 2 MG/3ML SOPN   3. Hyperlipidemia associated with type 2 diabetes mellitus (HCC) Continue rosuvastatin (CRESTOR) 5 MG tablet  enalapril (VASOTEC) 10 MG tablet  metFORMIN (GLUCOPHAGE) 500 MG tablet  Blood Glucose Monitoring Suppl (ACCU-CHEK GUIDE) w/Device KIT  Semaglutide,0.25  or 0.5MG /DOS, (OZEMPIC, 0.25 OR 0.5 MG/DOSE,) 2 MG/3ML SOPN   4. Grief Increase exercise Surround self with supportive family and friends  5. BMI 60.0-69.9, adult (HCC)- current BMI 59.26  [name] is not currently in the action stage of change. As such, his goal is to maintain weight for now. He has agreed to the Category 4 Plan.   Exercise goals: All adults should avoid inactivity. Some physical activity is better than none, and adults who participate in any amount of physical activity gain some health benefits. Adults should also include muscle-strengthening activities that involve all major muscle groups on 2 or more days a week.  Behavioral modification strategies: increasing lean protein intake, decreasing simple carbohydrates, increasing vegetables, increasing water intake, decreasing liquid calories, no skipping meals, meal planning and cooking strategies, keeping healthy foods in the home, ways to avoid boredom eating, ways to avoid night time snacking, better snacking choices, emotional eating strategies, holiday eating strategies , and planning for success.  B[place]ura has agreed to follow-up with our clinic in 4 weeks. He was informed of the importance of frequent follow-up visits to maximize his success with intensive lifestyle modifications for his multiple health conditions.   Check Fasting Labs at next OV  Objective:   Blood pressure (!) 180/105, pulse 91, temperature 97.6 F (36.4 C), height 5\' 10"  (1.778 m), weight (!) 413 lb (187.3 kg), SpO2 99%. Body mass index is 59.26 kg/m.  General: Cooperative, alert, well developed, in no acute distress. HEENT: Conjunctivae and lids unremarkable. Cardiovascular: Regular rhythm.  Lungs: Normal work of breathing. Neurologic: No focal deficits.   Lab Results  Component Value Date   CREATININE 0.74 (L) 11/19/2022   BUN 19 11/19/2022   NA 139 11/19/2022   K 3.6 11/19/2022   CL 96 11/19/2022   CO2 25 11/19/2022   Lab Results   Component Value Date   ALT 23 11/19/2022   AST 17 11/19/2022   ALKPHOS 93 11/19/2022   BILITOT 0.3 11/19/2022   Lab Results  Component Value Date   HGBA1C 7.6 (H) 11/19/2022   HGBA1C 7.6 (H) 09/03/2022   HGBA1C 6.1 01/23/2021   HGBA1C 6.3 09/04/2020   HGBA1C 6.8 05/05/2020   Lab Results  Component Value Date   INSULIN 43.8 (H) 11/19/2022   Lab Results  Component Value Date   TSH 3.580 11/19/2022   Lab Results  Component Value Date   CHOL 168 11/19/2022   HDL 43 11/19/2022   LDLCALC 101 (H) 11/19/2022   TRIG 135 11/19/2022   CHOLHDL 3.9 09/03/2022   Lab Results  Component Value Date   VD25OH 31.1 11/19/2022   VD25OH 26 (L) 09/03/2022   VD25OH 17 (L) 01/26/2020   Lab Results  Component Value Date   WBC 18.5 (H) 04/24/2023   HGB 14.1 04/24/2023   HCT 44.8 04/24/2023   MCV 81.6 04/24/2023   PLT 437 (H) 04/24/2023   No results found for: "IRON", "TIBC", "FERRITIN"  Attestation Statements:   Reviewed by clinician on day of visit: allergies, medications, problem list, medical history, surgical history, family history, social history, and previous encounter notes  I have reviewed the above documentation for accuracy and completeness, and I agree with the above. -  Kelsie Kramp d. Fidela Cieslak, NP-C

## 2023-05-13 ENCOUNTER — Telehealth (INDEPENDENT_AMBULATORY_CARE_PROVIDER_SITE_OTHER): Payer: MEDICAID | Admitting: Psychology

## 2023-05-13 DIAGNOSIS — F5089 Other specified eating disorder: Secondary | ICD-10-CM

## 2023-05-13 DIAGNOSIS — F909 Attention-deficit hyperactivity disorder, unspecified type: Secondary | ICD-10-CM | POA: Diagnosis not present

## 2023-05-13 NOTE — Progress Notes (Signed)
  Office: [phone]  /  Fax: [phone]    Date: May 13, 2023  Appointment Start Time: 3:54pm Duration: 17 minutes Provider: Lawerance Cruel, Psy.D. Type of Session: Individual Therapy  Location of Patient: Home (private location) Location of Provider: Provider's Home (private office) Type of Contact: Telepsychological Visit via MyChart Video Visit  Session Content: Allen Harding is a 27 y.o. male presenting for a follow-up appointment to address the previously established treatment goal of increasing coping skills.Today's appointment was a telepsychological visit. Allen Harding provided verbal consent for today's telepsychological appointment and he is aware he is responsible for securing confidentiality on his end of the session. Prior to proceeding with today's appointment, Allen Harding's physical location at the time of this appointment was obtained as well a phone number he could be reached at in the event of technical difficulties. Allen Harding and this provider participated in today's telepsychological service.   This provider conducted a brief check-in. Allen Harding shared his grandfather passed away. Associated thoughts and feelings were briefly processed. He discussed coping by watching YouTube videos. Regarding eating habits, Allen Harding described them as "okay." Discussed what went well during Thanksgiving as it relates to eating and what he could do differently during future holidays. Psychoeducation regarding making better choices and engaging in portion control during the holidays/celebrations/holidays was provided. More specifically, this provider discussed the following strategies: coming to meals hungry, but not starving; avoid filling up on appetizers; managing portion sizes; not completely depriving yourself; making the plate colorful (e.g., vegetables); pacing yourself (e.g., waiting 10 minutes before going back for seconds); taking advantage of the nutritious foods; practicing mindfulness; staying hydrated; and  avoid bringing home leftovers. Overall, Allen Harding was receptive to today's appointment as evidenced by openness to sharing, responsiveness to feedback, and willingness to implement discussed strategies .  Mental Status Examination:  Appearance: neat Behavior: appropriate to circumstances Mood: neutral Affect: mood congruent Speech: WNL Eye Contact: appropriate Psychomotor Activity: WNL Gait: unable to assess Thought Process: linear, logical, and goal directed and no evidence or endorsement of suicidal, homicidal, and self-harm ideation, plan and intent  Thought Content/Perception: no hallucinations, delusions, bizarre thinking or behavior endorsed or observed Orientation: AAOx4 Memory/Concentration: intact Insight: fair Judgment: fair  Interventions:  Conducted a brief chart review Provided empathic reflections and validation Provided positive reinforcement Employed supportive psychotherapy interventions to facilitate reduced distress and to improve coping skills with identified stressors Psychoeducation provided regarding strategies for celebrations/holidays/vacations  DSM-5 Diagnosis(es):  F50.89 Other Specified Feeding or Eating Disorder, Emotional Eating Behaviors and F90.9 Unspecified Attention-Deficit/Hyperactivity Disorder   Treatment Goal & Progress: During the initial appointment with this provider, the following treatment goal was established: increase coping skills. Allen Harding has demonstrated progress in his goal as evidenced by increased awareness of hunger patterns and increased awareness of triggers for emotional eating behaviors. Allen Harding also continues to demonstrate willingness to engage in learned skill(s).  Plan: The next appointment is scheduled for 06/02/2023 at 8am, which will be via MyChart Video Visit. The next session will focus on working towards the established treatment goal.

## 2023-05-20 ENCOUNTER — Encounter: Payer: Self-pay | Admitting: Family Medicine

## 2023-05-20 NOTE — Telephone Encounter (Signed)
 Care team updated and letter sent for eye exam notes.

## 2023-05-21 ENCOUNTER — Ambulatory Visit (INDEPENDENT_AMBULATORY_CARE_PROVIDER_SITE_OTHER): Payer: MEDICAID

## 2023-05-21 DIAGNOSIS — E875 Hyperkalemia: Secondary | ICD-10-CM | POA: Diagnosis not present

## 2023-05-21 DIAGNOSIS — E1169 Type 2 diabetes mellitus with other specified complication: Secondary | ICD-10-CM

## 2023-05-21 DIAGNOSIS — I10 Essential (primary) hypertension: Secondary | ICD-10-CM | POA: Diagnosis not present

## 2023-05-21 DIAGNOSIS — E119 Type 2 diabetes mellitus without complications: Secondary | ICD-10-CM | POA: Diagnosis not present

## 2023-05-21 LAB — HM DIABETES EYE EXAM

## 2023-05-21 NOTE — Progress Notes (Signed)
Allen Harding arrived 05/21/2023 and has given verbal consent to obtain images and complete their overdue diabetic retinal screening.  The images have been sent to an ophthalmologist or optometrist for review and interpretation.  Results will be sent back to [name], FNP for review.  Patient has been informed they will be contacted when we receive the results via telephone or MyChart

## 2023-06-02 ENCOUNTER — Telehealth (INDEPENDENT_AMBULATORY_CARE_PROVIDER_SITE_OTHER): Payer: MEDICAID | Admitting: Psychology

## 2023-06-02 DIAGNOSIS — F909 Attention-deficit hyperactivity disorder, unspecified type: Secondary | ICD-10-CM | POA: Diagnosis not present

## 2023-06-02 DIAGNOSIS — F5089 Other specified eating disorder: Secondary | ICD-10-CM

## 2023-06-02 NOTE — Progress Notes (Signed)
  Office: [phone]  /  Fax: [phone]    Date: June 02, 2023  Appointment Start Time: 8:02am Duration: 19 minutes Provider: Lawerance Cruel, Psy.D. Type of Session: Individual Therapy  Location of Patient: Home (private location) Location of Provider: Provider's Home (private office) Type of Contact: Telepsychological Visit via MyChart Video Visit  Session Content: Allen Harding is a 27 y.o. male presenting for a follow-up appointment to address the previously established treatment goal of increasing coping skills.Today's appointment was a telepsychological visit. [name] provided verbal consent for today's telepsychological appointment and he is aware he is responsible for securing confidentiality on his end of the session. Prior to proceeding with today's appointment, [name]'s physical location at the time of this appointment was obtained as well a phone number he could be reached at in the event of technical difficulties. [name] and this provider participated in today's telepsychological service.   This provider conducted a brief check-in. [name] shared about recent events, including packing to move from his current residence. He described his current eating habits as "fair." Further explored. [name] acknowledged he is skipping meals. This provider discussed the importance of eating regularly. [name] acknowledged understanding and was engaged in problem solving to help with the aforementioned. He agreed to make hard boiled eggs for breakfast; eat protein for snacks; and buy frozen meals congruent to prescribed structured meal plan. Moreover, this provider and [name] discussed what went well as it relates to eating on Christmas and what he could do differently during future holidays. Reviewed previously discussed strategies. Overall, [name] was receptive to today's appointment as evidenced by openness to sharing, responsiveness to feedback, and willingness to implement discussed strategies .  Mental  Status Examination:  Appearance: neat Behavior: appropriate to circumstances Mood: neutral Affect: mood congruent Speech: WNL Eye Contact: intermittent Psychomotor Activity: WNL Gait: unable to assess Thought Process: linear, logical, and goal directed and no evidence or endorsement of suicidal, homicidal, and self-harm ideation, plan and intent  Thought Content/Perception: no hallucinations, delusions, bizarre thinking or behavior endorsed or observed Orientation: AAOx4 Memory/Concentration: intact Insight: fair Judgment: fair  Interventions:  Conducted a brief chart review Provided empathic reflections and validation Reviewed content from the previous session Employed supportive psychotherapy interventions to facilitate reduced distress and to improve coping skills with identified stressors Engaged patient in problem solving  DSM-5 Diagnosis(es):  F50.89 Other Specified Feeding or Eating Disorder, Emotional Eating Behaviors and F90.9 Unspecified Attention-Deficit/Hyperactivity Disorder   Treatment Goal & Progress: During the initial appointment with this provider, the following treatment goal was established: increase coping skills. [name] has demonstrated progress in his goal as evidenced by increased awareness of hunger patterns and increased awareness of triggers for emotional eating behaviors. [name] also continues to demonstrate willingness to engage in learned skill(s).  Plan: The next appointment is scheduled for 06/30/2023 at 8:30am, which will be via MyChart Video Visit. The next session will focus on working towards the established treatment goal.   Lawerance Cruel, PsyD

## 2023-06-03 ENCOUNTER — Other Ambulatory Visit: Payer: Self-pay

## 2023-06-03 DIAGNOSIS — D75839 Thrombocytosis, unspecified: Secondary | ICD-10-CM

## 2023-06-03 DIAGNOSIS — D72829 Elevated white blood cell count, unspecified: Secondary | ICD-10-CM

## 2023-06-05 ENCOUNTER — Inpatient Hospital Stay: Payer: MEDICAID | Attending: Hematology

## 2023-06-05 DIAGNOSIS — D72829 Elevated white blood cell count, unspecified: Secondary | ICD-10-CM | POA: Insufficient documentation

## 2023-06-05 DIAGNOSIS — D75839 Thrombocytosis, unspecified: Secondary | ICD-10-CM | POA: Diagnosis present

## 2023-06-05 LAB — CBC WITH DIFFERENTIAL/PLATELET
Abs Immature Granulocytes: 0.06 10*3/uL (ref 0.00–0.07)
Basophils Absolute: 0.1 10*3/uL (ref 0.0–0.1)
Basophils Relative: 0 %
Eosinophils Absolute: 0.3 10*3/uL (ref 0.0–0.5)
Eosinophils Relative: 2 %
HCT: 43.6 % (ref 39.0–52.0)
Hemoglobin: 14.1 g/dL (ref 13.0–17.0)
Immature Granulocytes: 0 %
Lymphocytes Relative: 29 %
Lymphs Abs: 4 10*3/uL (ref 0.7–4.0)
MCH: 26.9 pg (ref 26.0–34.0)
MCHC: 32.3 g/dL (ref 30.0–36.0)
MCV: 83.2 fL (ref 80.0–100.0)
Monocytes Absolute: 0.7 10*3/uL (ref 0.1–1.0)
Monocytes Relative: 5 %
Neutro Abs: 8.6 10*3/uL — ABNORMAL HIGH (ref 1.7–7.7)
Neutrophils Relative %: 64 %
Platelets: 380 10*3/uL (ref 150–400)
RBC: 5.24 MIL/uL (ref 4.22–5.81)
RDW: 15.5 % (ref 11.5–15.5)
WBC: 13.7 10*3/uL — ABNORMAL HIGH (ref 4.0–10.5)
nRBC: 0 % (ref 0.0–0.2)

## 2023-06-05 LAB — LACTATE DEHYDROGENASE: LDH: 149 U/L (ref 98–192)

## 2023-06-11 ENCOUNTER — Ambulatory Visit (INDEPENDENT_AMBULATORY_CARE_PROVIDER_SITE_OTHER): Payer: MEDICAID | Admitting: Adult Health

## 2023-06-11 ENCOUNTER — Encounter (INDEPENDENT_AMBULATORY_CARE_PROVIDER_SITE_OTHER): Payer: Self-pay | Admitting: Adult Health

## 2023-06-11 DIAGNOSIS — Z6841 Body Mass Index (BMI) 40.0 and over, adult: Secondary | ICD-10-CM

## 2023-06-11 DIAGNOSIS — I152 Hypertension secondary to endocrine disorders: Secondary | ICD-10-CM | POA: Diagnosis not present

## 2023-06-11 DIAGNOSIS — E785 Hyperlipidemia, unspecified: Secondary | ICD-10-CM | POA: Diagnosis not present

## 2023-06-11 DIAGNOSIS — E1159 Type 2 diabetes mellitus with other circulatory complications: Secondary | ICD-10-CM

## 2023-06-11 DIAGNOSIS — E1169 Type 2 diabetes mellitus with other specified complication: Secondary | ICD-10-CM | POA: Diagnosis not present

## 2023-06-11 DIAGNOSIS — E559 Vitamin D deficiency, unspecified: Secondary | ICD-10-CM

## 2023-06-11 DIAGNOSIS — Z7984 Long term (current) use of oral hypoglycemic drugs: Secondary | ICD-10-CM

## 2023-06-11 MED ORDER — VITAMIN D (ERGOCALCIFEROL) 1.25 MG (50000 UNIT) PO CAPS: ORAL_CAPSULE | ORAL | 0 refills | Status: DC

## 2023-06-11 NOTE — Progress Notes (Signed)
 WEIGHT SUMMARY AND BIOMETRICS  Vitals Temp: (!) 97.5 F (36.4 C) BP: (!) 170/118 Pulse Rate: 92 SpO2: 99 %   Anthropometric Measurements Height: 5' 10 (1.778 m) Weight: (!) 415 lb (188.2 kg) BMI (Calculated): 59.55 Weight at Last Visit: 413lb Weight Lost Since Last Visit: 0 Weight Gained Since Last Visit: 2lb Starting Weight: 441lb Total Weight Loss (lbs): 26 lb (11.8 kg) Peak Weight: 446lb   Body Composition  Body Fat %: 45.8 % Fat Mass (lbs): 190.2 lbs Muscle Mass (lbs): 214.4 lbs Total Body Water (lbs): 153.8 lbs Visceral Fat Rating : 35   Other Clinical Data Fasting: yes Labs: yes Today's Visit #: 8 Starting Date: 11/19/22    Chief Complaint:   OBESITY Allen Harding is here to discuss his progress with his obesity treatment plan. He is on the the Category 4 Plan and states he is following his eating plan approximately 50 % of the time.  He states he is exercising: Cleaning up, packing, MOVING THIS MONTH   Interim History:  Allen Harding continues to struggle with the recent death of his grandfather. He is followed by therapist [facility] He denies SI/HI  His BP is quite elevated at OV He denies acute cardiac sx's at present. He reports taking all his antihypertensives as directed. His mother, Allen Harding, whom is at [facility] today and all his medical appt's- reports that he takes EVERY OTHER DAY  EPIC synopsis review demonstrates that his BP has not been well controlled for months. He was last seen by his PCP 03/2023- instructed to f/u 1 month- he has NOT been seen by PCP since Oct '24 OV. [facility] He has upcoming OV with Dr. IVAR Scarce on 1/22- OV details written out and given to pt's mother  Discussed at length the risks of uncontrolled BP Discussed strategies on how he can consistently take all antihypertensive   Subjective:   1. Type 2 diabetes mellitus with morbid obesity (HCC) Lab Results  Component Value Date   HGBA1C 7.6  (H) 11/19/2022   HGBA1C 7.6 (H) 09/03/2022   HGBA1C 6.1 01/23/2021    T2D managed by Dr. Marley He denies sx's of hypoglycemia He is on daily Metfromin 500mg  and weekly Ozempic  0.5mg   2. Hypertension associated with type 2 diabetes mellitus (HCC) UNCONTROLLED His BP is quite elevated at OV He denies acute cardiac sx's at present. He reports taking all his antihypertensives as directed. His mother, Allen Harding, whom is at [facility] today and all his medical appt's- reports that he takes EVERY OTHER DAY  EPIC synopsis review demonstrates that his BP has not been well controlled for months. He was last seen by his PCP 03/2023- instructed to f/u 1 month- he has NOT been seen by PCP since Oct '24 OV. He was [facility] He has upcoming OV with Dr. IVAR Scarce on 1/22- OV details written out and given to pt's mother  Current antihypertensive regime: chlorthalidone  (HYGROTON ) 25 MG tablet  rosuvastatin  (CRESTOR ) 5 MG tablet  enalapril  (VASOTEC ) 10 MG tablet  metFORMIN  (GLUCOPHAGE ) 500 MG tablet  Metoprolol  Tartrate 75 MG TABS  Semaglutide ,0.25 or 0.5MG /DOS, (OZEMPIC , 0.25 OR 0.5 MG/DOSE,) 2 MG/3ML SOPN   3. Vitamin D  deficiency  Latest Reference Range & Units 09/03/22 08:50 11/19/22 10:19  Vitamin D , 25-Hydroxy 30.0 - 100.0 ng/mL 26 (L) 31.1  (L): Data is abnormally low  He is on weekly Ergocalciferol - denies N/V/Muscle Weakness  4. Hyperlipidemia associated with type 2  diabetes mellitus (HCC) Lipid Panel     Component Value Date/Time   CHOL 168 11/19/2022 1019   TRIG 135 11/19/2022 1019   HDL 43 11/19/2022 1019   CHOLHDL 3.9 09/03/2022 0850   VLDL 21 08/08/2015 1152   LDLCALC 101 (H) 11/19/2022 1019   LDLCALC 130 (H) 09/03/2022 0850   LABVLDL 24 11/19/2022 1019    PCP manages daily Crestor  5mg  He denies myalgias   Assessment/Plan:   1. Type 2 diabetes mellitus with morbid obesity (HCC) (Primary) Check Labs - Hemoglobin A1c - Insulin , random -  Vitamin B12 - Magnesium  2. Hypertension associated with type 2 diabetes mellitus (HCC) Check Labs - Comprehensive metabolic panel  IMPERATIVE that you take all antihypertensive therapy as directed. Limit caffeine Discussed at length: Red Flag sx's and if ANY develop- SEEK IMMEDIATE medical assistance. Pt and pt's mother Allen Harding) both verbalized understanding/agreement  Keep OV with Cards- OV details written out and handed to pt's mother  3. Vitamin D  deficiency Check Labs - VITAMIN D  25 Hydroxy (Vit-D Deficiency, Fractures) Refill  Vitamin D , Ergocalciferol , (DRISDOL ) 1.25 MG (50000 UNIT) CAPS capsule TAKE 1 CAPSULE THE SAME DAY EACH WEEK. Dispense: 4 capsule, Refills: 0 ordered   4. Hyperlipidemia associated with type 2 diabetes mellitus (HCC) Check Labs - Lipid panel  5. BMI 60.0-69.9, adult (HCC)- current BMI 59.55  Allen Harding is not currently in the action stage of change. As such, his goal is to get back to weightloss efforts . He has agreed to the Category 4 Plan.   Exercise goals: No exercise has been prescribed at this time.  Behavioral modification strategies: increasing lean protein intake, decreasing simple carbohydrates, increasing vegetables, increasing water intake, decreasing sodium intake, increasing high fiber foods, decreasing eating out, no skipping meals, meal planning and cooking strategies, keeping healthy foods in the home, ways to avoid boredom eating, emotional eating strategies, planning for success, and decreasing junk food.  Allen Harding has agreed to follow-up with our clinic in 4 weeks. He was informed of the importance of frequent follow-up visits to maximize his success with intensive lifestyle modifications for his multiple health conditions.   Allen Harding was informed we would discuss his lab results at his next visit unless there is a critical issue that needs to be addressed sooner. Allen Harding agreed to keep his next visit at the agreed upon time to discuss these  results.  Objective:   Blood pressure (!) 170/118, pulse 92, temperature (!) 97.5 F (36.4 C), height 5' 10 (1.778 m), weight (!) 415 lb (188.2 kg), SpO2 99%. Body mass index is 59.55 kg/m.  General: Cooperative, alert, well developed, in no acute distress. HEENT: Conjunctivae and lids unremarkable. Cardiovascular: Regular rhythm.  Lungs: Normal work of breathing. Neurologic: No focal deficits.   Lab Results  Component Value Date   CREATININE 0.74 (L) 11/19/2022   BUN 19 11/19/2022   NA 139 11/19/2022   K 3.6 11/19/2022   CL 96 11/19/2022   CO2 25 11/19/2022   Lab Results  Component Value Date   ALT 23 11/19/2022   AST 17 11/19/2022   ALKPHOS 93 11/19/2022   BILITOT 0.3 11/19/2022   Lab Results  Component Value Date   HGBA1C 7.6 (H) 11/19/2022   HGBA1C 7.6 (H) 09/03/2022   HGBA1C 6.1 01/23/2021   HGBA1C 6.3 09/04/2020   HGBA1C 6.8 05/05/2020   Lab Results  Component Value Date   INSULIN  43.8 (H) 11/19/2022   Lab Results  Component Value Date   TSH 3.580  11/19/2022   Lab Results  Component Value Date   CHOL 168 11/19/2022   HDL 43 11/19/2022   LDLCALC 101 (H) 11/19/2022   TRIG 135 11/19/2022   CHOLHDL 3.9 09/03/2022   Lab Results  Component Value Date   VD25OH 31.1 11/19/2022   VD25OH 26 (L) 09/03/2022   VD25OH 17 (L) 01/26/2020   Lab Results  Component Value Date   WBC 13.7 (H) 06/05/2023   HGB 14.1 06/05/2023   HCT 43.6 06/05/2023   MCV 83.2 06/05/2023   PLT 380 06/05/2023   No results found for: IRON, TIBC, FERRITIN  Attestation Statements:   Reviewed by clinician on day of visit: allergies, medications, problem list, medical history, surgical history, family history, social history, and previous encounter notes.  I have reviewed the above documentation for accuracy and completeness, and I agree with the above. -  Allen Harding d. Samarah Hogle, NP-C

## 2023-06-25 ENCOUNTER — Ambulatory Visit (HOSPITAL_BASED_OUTPATIENT_CLINIC_OR_DEPARTMENT_OTHER): Payer: MEDICAID | Admitting: Cardiovascular Disease

## 2023-06-25 ENCOUNTER — Encounter (HOSPITAL_BASED_OUTPATIENT_CLINIC_OR_DEPARTMENT_OTHER): Payer: Self-pay | Admitting: Cardiovascular Disease

## 2023-06-25 VITALS — BP 214/136 | HR 102 | Ht 70.0 in | Wt >= 6400 oz

## 2023-06-25 DIAGNOSIS — I1A0 Resistant hypertension: Secondary | ICD-10-CM | POA: Diagnosis not present

## 2023-06-25 DIAGNOSIS — E782 Mixed hyperlipidemia: Secondary | ICD-10-CM | POA: Diagnosis not present

## 2023-06-25 DIAGNOSIS — Z5181 Encounter for therapeutic drug level monitoring: Secondary | ICD-10-CM

## 2023-06-25 DIAGNOSIS — I10 Essential (primary) hypertension: Secondary | ICD-10-CM

## 2023-06-25 MED ORDER — OLMESARTAN-AMLODIPINE-HCTZ 40-5-25 MG PO TABS: 1.0000 | ORAL_TABLET | Freq: Every day | ORAL | 3 refills | Status: DC

## 2023-06-25 MED ORDER — METOPROLOL SUCCINATE ER 100 MG PO TB24: 100.0000 mg | ORAL_TABLET | Freq: Every day | ORAL | 3 refills | Status: AC

## 2023-06-25 NOTE — Patient Instructions (Addendum)
Medication Instructions:  STOP CHLORTHALIDONE   STOP ENALAPRIL   STOP METOPROLOL TART  START METOPROLOL SUC 100 MG DAILY   START OLMESARTAN-AMLODIPINE-HCT 40-5-25 MG DAILY   OK TO TAKE ALL MEDICATIONS IN THE MORNING    Labwork: BMET IN 1 WEEK    Testing/Procedures: NONE   Follow-Up: PHARM D IN 1 MONTH EITHER AT [facility]   DASH Eating Plan DASH stands for "Dietary Approaches to Stop Hypertension." The DASH eating plan is a healthy eating plan that has been shown to reduce high blood pressure (hypertension). It may also reduce your risk for type 2 diabetes, heart disease, and stroke. The DASH eating plan may also help with weight loss. What are tips for following this plan?  General guidelines Avoid eating more than 2,300 mg (milligrams) of salt (sodium) a day. If you have hypertension, you may need to reduce your sodium intake to 1,500 mg a day. Limit alcohol intake to no more than 1 drink a day for nonpregnant women and 2 drinks a day for men. One drink equals 12 oz of beer, 5 oz of wine, or 1 oz of hard liquor. Work with your health care provider to maintain a healthy body weight or to lose weight. Ask what an ideal weight is for you. Get at least 30 minutes of exercise that causes your heart to beat faster (aerobic exercise) most days of the week. Activities may include walking, swimming, or biking. Work with your health care provider or diet and nutrition specialist (dietitian) to adjust your eating plan to your individual calorie needs. Reading food labels  Check food labels for the amount of sodium per serving. Choose foods with less than 5 percent of the Daily Value of sodium. Generally, foods with less than 300 mg of sodium per serving fit into this eating plan. To find whole grains, look for the word "whole" as the first word in the ingredient list. Shopping Buy products labeled as "low-sodium" or "no salt added." Buy fresh foods. Avoid canned  foods and premade or frozen meals. Cooking Avoid adding salt when cooking. Use salt-free seasonings or herbs instead of table salt or sea salt. Check with your health care provider or pharmacist before using salt substitutes. Do not fry foods. Cook foods using healthy methods such as baking, boiling, grilling, and broiling instead. Cook with heart-healthy oils, such as olive, canola, soybean, or sunflower oil. Meal planning Eat a balanced diet that includes: 5 or more servings of fruits and vegetables each day. At each meal, try to fill half of your plate with fruits and vegetables. Up to 6-8 servings of whole grains each day. Less than 6 oz of lean meat, poultry, or fish each day. A 3-oz serving of meat is about the same size as a deck of cards. One egg equals 1 oz. 2 servings of low-fat dairy each day. A serving of nuts, seeds, or beans 5 times each week. Heart-healthy fats. Healthy fats called Omega-3 fatty acids are found in foods such as flaxseeds and coldwater fish, like sardines, salmon, and mackerel. Limit how much you eat of the following: Canned or prepackaged foods. Food that is high in trans fat, such as fried foods. Food that is high in saturated fat, such as fatty meat. Sweets, desserts, sugary drinks, and other foods with added sugar. Full-fat dairy products. Do not salt foods before eating. Try to eat at least 2 vegetarian meals each week. Eat more home-cooked food and less restaurant, buffet, and fast  food. When eating at a restaurant, ask that your food be prepared with less salt or no salt, if possible. What foods are recommended? The items listed may not be a complete list. Talk with your dietitian about what dietary choices are best for you. Grains Whole-grain or whole-wheat bread. Whole-grain or whole-wheat pasta. Brown rice. Orpah Cobb. Bulgur. Whole-grain and low-sodium cereals. Pita bread. Low-fat, low-sodium crackers. Whole-wheat flour  tortillas. Vegetables Fresh or frozen vegetables (raw, steamed, roasted, or grilled). Low-sodium or reduced-sodium tomato and vegetable juice. Low-sodium or reduced-sodium tomato sauce and tomato paste. Low-sodium or reduced-sodium canned vegetables. Fruits All fresh, dried, or frozen fruit. Canned fruit in natural juice (without added sugar). Meat and other protein foods Skinless chicken or [place]. Ground chicken or [place]. Pork with fat trimmed off. Fish and seafood. Egg whites. Dried beans, peas, or lentils. Unsalted nuts, nut butters, and seeds. Unsalted canned beans. Lean cuts of beef with fat trimmed off. Low-sodium, lean deli meat. Dairy Low-fat (1%) or fat-free (skim) milk. Fat-free, low-fat, or reduced-fat cheeses. Nonfat, low-sodium ricotta or cottage cheese. Low-fat or nonfat yogurt. Low-fat, low-sodium cheese. Fats and oils Soft margarine without trans fats. Vegetable oil. Low-fat, reduced-fat, or light mayonnaise and salad dressings (reduced-sodium). Canola, safflower, olive, soybean, and sunflower oils. Avocado. Seasoning and other foods Herbs. Spices. Seasoning mixes without salt. Unsalted popcorn and pretzels. Fat-free sweets. What foods are not recommended? The items listed may not be a complete list. Talk with your dietitian about what dietary choices are best for you. Grains Baked goods made with fat, such as croissants, muffins, or some breads. Dry pasta or rice meal packs. Vegetables Creamed or fried vegetables. Vegetables in a cheese sauce. Regular canned vegetables (not low-sodium or reduced-sodium). Regular canned tomato sauce and paste (not low-sodium or reduced-sodium). Regular tomato and vegetable juice (not low-sodium or reduced-sodium). Rosita Fire. Olives. Fruits Canned fruit in a light or heavy syrup. Fried fruit. Fruit in cream or butter sauce. Meat and other protein foods Fatty cuts of meat. Ribs. Fried meat. Tomasa Blase. Sausage. Bologna and other processed lunch meats.  Salami. Fatback. Hotdogs. Bratwurst. Salted nuts and seeds. Canned beans with added salt. Canned or smoked fish. Whole eggs or egg yolks. Chicken or [place] with skin. Dairy Whole or 2% milk, cream, and half-and-half. Whole or full-fat cream cheese. Whole-fat or sweetened yogurt. Full-fat cheese. Nondairy creamers. Whipped toppings. Processed cheese and cheese spreads. Fats and oils Butter. Stick margarine. Lard. Shortening. Ghee. Bacon fat. Tropical oils, such as coconut, palm kernel, or palm oil. Seasoning and other foods Salted popcorn and pretzels. Onion salt, garlic salt, seasoned salt, table salt, and sea salt. Worcestershire sauce. Tartar sauce. Barbecue sauce. Teriyaki sauce. Soy sauce, including reduced-sodium. Steak sauce. Canned and packaged gravies. Fish sauce. Oyster sauce. Cocktail sauce. Horseradish that you find on the shelf. Ketchup. Mustard. Meat flavorings and tenderizers. Bouillon cubes. Hot sauce and Tabasco sauce. Premade or packaged marinades. Premade or packaged taco seasonings. Relishes. Regular salad dressings. Where to find more information: National Heart, Lung, and Blood Institute: PopSteam.is American Heart Association: www.heart.org Summary The DASH eating plan is a healthy eating plan that has been shown to reduce high blood pressure (hypertension). It may also reduce your risk for type 2 diabetes, heart disease, and stroke. With the DASH eating plan, you should limit salt (sodium) intake to 2,300 mg a day. If you have hypertension, you may need to reduce your sodium intake to 1,500 mg a day. When on the DASH eating plan, aim to eat more  fresh fruits and vegetables, whole grains, lean proteins, low-fat dairy, and heart-healthy fats. Work with your health care provider or diet and nutrition specialist (dietitian) to adjust your eating plan to your individual calorie needs. This information is not intended to replace advice given to you by your health care provider.  Make sure you discuss any questions you have with your health care provider. Document Released: 05/09/2011 Document Revised: 05/02/2017 Document Reviewed: 05/13/2016 Elsevier Patient Education  2020 [employer].

## 2023-06-25 NOTE — Progress Notes (Signed)
[facility] Initial Assessment:    Date:  06/25/2023   ID:  Allen Harding, DOB 02-10-1996, MRN [id number]  PCP:  [name], FNP  Cardiologist:  None  Nephrologist:  Referring MD: [name], FNP   CC: Hypertension  History of Present Illness:    Allen Harding is a 28 y.o. male with a hx of hypertension and morbid obesity here to establish care in the [facility].  He has been taking chlorthalidoe, enalapril and metoprolol and BP has been uncontrolled.  [facility] was seen at [facility] where his mother noted that he was taking his medication every other day.  Allen Harding presents for a routine follow-up. He has been struggling with hypertension for a couple of years, occasionally experiencing palpitations, which he manages by calming himself down. He occasionally checks his blood pressure at home, reporting readings around 150. He denies any associated headaches, attributing any head pain to sinus issues.  The patient is currently in the process of moving, which he considers his primary form of exercise. He reports occasional shortness of breath when lifting heavy boxes, but denies any alarming symptoms or chest pain. He denies any swelling in his legs or feet.  In terms of diet, the patient is on a high protein diet as part of a weight management program and primarily cooks at home, with occasional takeout. He reports a recent weight loss, although he does not feel it is significant.  He has a history of mild to moderate autism, ADHD, and a learning disorder. He is currently studying to get his driver's license. He reports a family history of high blood pressure and heart disease, with several family members having suffered heart attacks.  Allen Harding is currently on multiple medications for his blood pressure, including , chlorthalidone, enalapril, and metoprolol. He admits to occasionally missing doses, particularly disliking the number  of pills he has to take. He also takes rosuvastatin for his cholesterol and Ozempic as part of his weight management program.  He rarely drinks alcohol, consuming alcohol approximately twice a year, and denies any smoking history. He reports a good response to his current medications, but expresses a desire for a simpler medication regimen.      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   metoprolol succinate (TOPROL-XL) 100 MG 24 hr tablet Take 1 tablet (100 mg total) by mouth daily. Take with or immediately following a meal.   Multiple Vitamin (MULTIVITAMIN) tablet Take 1 tablet by mouth daily.   Olmesartan-amLODIPine-HCTZ (TRIBENZOR) 40-5-25 MG TABS Take 1 tablet by mouth daily.  oxybutynin (DITROPAN-XL) 10 MG 24 hr tablet TAKE 1 TABLET BY MOUTH DAILY.   rosuvastatin (CRESTOR) 5 MG tablet Take 1 tablet (5 mg total) by mouth daily.   Semaglutide,0.25 or 0.5MG /DOS, (OZEMPIC, 0.25 OR 0.5 MG/DOSE,) 2 MG/3ML SOPN Inject 0.5 mg into the skin once a week.   testosterone cypionate (DEPOTESTOTERONE CYPIONATE) 100 MG/ML injection Inject 1 mL (100 mg total) into the muscle every 14 (fourteen) days. For IM use only   Vitamin D, Ergocalciferol, (DRISDOL) 1.25 MG (50000 UNIT) CAPS  capsule TAKE 1 CAPSULE THE SAME DAY EACH WEEK.   [DISCONTINUED] chlorthalidone (HYGROTON) 25 MG tablet TAKE 2 TABLETS BY MOUTH DAILY.   [DISCONTINUED] enalapril (VASOTEC) 10 MG tablet TAKE ONE TABLET BY MOUTH ONCE DAILY.   [DISCONTINUED] Metoprolol Tartrate 75 MG TABS Take 1 tablet (75 mg total) by mouth 2 (two) times daily.     Allergies:   Patient has no known allergies.   Social History   Socioeconomic History   Marital status: Single    Spouse name: Not on file   Number of children: Not on file   Years of education: Not on file   Highest education level: 12th grade  Occupational History   Not on file  Tobacco Use   Smoking status: Former    Current packs/day: 0.00    Types: Cigarettes    Quit date: 01/09/2014    Years since quitting: 9.4   Smokeless tobacco: Never  Vaping Use   Vaping status: Never Used  Substance and Sexual Activity   Alcohol use: Yes    Comment: socially   Drug use: No   Sexual activity: Not on file  Other Topics Concern   Not on file  Social History Narrative   Lives with mom, sister, 2 nieces, grandparents and sister's fiance.    Social Drivers of Corporate investment banker Strain: Low Risk  (01/08/2023)   Overall Financial Resource Strain (CARDIA)    Difficulty of Paying Living Expenses: Not hard at all  Food Insecurity: No Food Insecurity (06/25/2023)   Hunger Vital Sign    Worried About Running Out of Food in the Last Year: Never true    Ran Out of Food in the Last Year: Never true  Transportation Needs: No Transportation Needs (06/25/2023)   PRAPARE - Administrator, Civil Service (Medical): No    Lack of Transportation (Non-Medical): No  Physical Activity: Insufficiently Active (06/25/2023)   Exercise Vital Sign    Days of Exercise per Week: 3 days    Minutes of Exercise per Session: 30 min  Stress: No Stress Concern Present (03/24/2023)   [employer] of Occupational Health - Occupational Stress Questionnaire    Feeling  of Stress : Only a little  Social Connections: Moderately Integrated (03/24/2023)   Social Connection and Isolation Panel [NHANES]    Frequency of Communication with Friends and Family: More than three times a week    Frequency of Social Gatherings with Friends and Family: More than three times a week    Attends Religious Services: More than 4 times per year    Active Member of [employer] or Organizations: Yes    Attends Engineer, structural: More than 4 times per year    Marital Status: Never married     Family History: The patient's family history includes Cancer in his maternal grandfather and maternal grandmother; Depression in his mother; Diabetes in his maternal grandmother and mother; Heart attack in his maternal grandfather and maternal  uncle; Hyperlipidemia in his mother; Hypertension in his maternal aunt, maternal uncle, mother, and sister; Obesity in his maternal grandfather, maternal grandmother, mother, and sister; Stroke in his maternal grandmother.  ROS:   Please see the history of present illness.     All other systems reviewed and are negative.  EKGs/Labs/Other Studies Reviewed:    EKG:  EKG is ordered today.    EKG Interpretation Date/Time:  Wednesday June 25 2023 14:13:13 EST Ventricular Rate:  99 PR Interval:  146 QRS Duration:  106 QT Interval:  368 QTC Calculation: 472 R Axis:   2  Text Interpretation: Normal sinus rhythm T wave abnormality, consider lateral ischemia When compared with ECG of 16-Nov-2015 12:36, Rate slower Confirmed by Chilton Si ([id number]) on 06/25/2023 2:21:32 PM          Recent Labs: 11/19/2022: ALT 23; BUN 19; Creatinine, Ser 0.74; Potassium 3.6; Sodium 139; TSH 3.580 06/05/2023: Hemoglobin 14.1; Platelets 380   Recent Lipid Panel    Component Value Date/Time   CHOL 168 11/19/2022 1019   TRIG 135 11/19/2022 1019   HDL 43 11/19/2022 1019   CHOLHDL 3.9 09/03/2022 0850   VLDL 21 08/08/2015 1152   LDLCALC 101 (H)  11/19/2022 1019   LDLCALC 130 (H) 09/03/2022 0850    Physical Exam:   VS:  BP (!) 214/136 Comment: right forearm  Pulse (!) 102   Ht 5\' 10"  (1.778 m)   Wt (!) 421 lb 1.6 oz (191 kg)   SpO2 98%   BMI 60.42 kg/m  , BMI Body mass index is 60.42 kg/m. GENERAL:  Well appearing HEENT: Pupils equal round and reactive, fundi not visualized, oral mucosa unremarkable NECK:  No jugular venous distention, waveform within normal limits, carotid upstroke brisk and symmetric, no bruits, no thyromegaly LUNGS:  Clear to auscultation bilaterally HEART:  RRR.  PMI not displaced or sustained,S1 and S2 within normal limits, no S3, no S4, no clicks, no rubs, no murmurs ABD:  Flat, positive bowel sounds normal in frequency in pitch, no bruits, no rebound, no guarding, no midline pulsatile mass, no hepatomegaly, no splenomegaly EXT:  2 plus pulses throughout, no edema, no cyanosis no clubbing SKIN:  No rashes no nodules NEURO:  Cranial nerves II through XII grossly intact, motor grossly intact throughout PSYCH:  Cognitively intact, oriented to person place and time   ASSESSMENT/PLAN:    # Hypertensive urgency: # Hypertension Uncontrolled with reported home readings around 150. Currently on multiple antihypertensive medications (Chlorthalidone, Enalapril, Metoprolol) with inconsistent adherence. Family history of hypertension. No reported symptoms of hypertensive urgency/emergency. -Discontinue Chlorthalidone, Enalapril, and Metoprolol. -Start Tribenzor (combination pill) once daily in the morning. -Switch Metoprolol to Metoprolol Succinate 100mg  daily (long-acting form) to be taken in the morning. -Encourage patient to monitor blood pressure at home and record readings. -Order Basic Metabolic Panel (BMP) to be done in a week. -Schedule follow-up appointment in 1 month to reassess blood pressure control. -discuss sleep study at follow up  # Hyperlipidemia Currently on Rosuvastatin. -Continue  Rosuvastatin, to be taken in the morning with other medications.  # Weight Management # Morbid obesity Patient is currently on Ozempic and has reported weight loss. -Continue Ozempic as prescribed by weight management team. -[facility]  # Sleep Disturbance Reports inconsistent sleep patterns with only about 4 hours of continuous sleep. No reported symptoms of sleep apnea. -Encourage consistent sleep hygiene practices.  General Health Maintenance / Followup Plans -Continue with weight management and  wellness program. -Complete pending lab work for weight management along with BMP. -Schedule follow-up appointment in 4 months with the doctor.     # Hyperlipidemia:  Continue rosuvastatin  # Tachycardia:  Intermittently symptomatic.  Will switch metoprolol to succinate for ease of administration.  Screening for Secondary Hypertension:     06/25/2023    2:42 PM  Causes  Drugs/Herbals Screened     - Comments limits salt.  Rare NSAIDs.  Rare EtOH.  No supplements.  No tobacco  Renovascular HTN N/A  Sleep Apnea Screened     - Comments snores, daytime somnolkence  Thyroid Disease Screened  Hyperaldosteronism N/A  Pheochromocytoma N/A  Cushing's Syndrome Screened  Hyperparathyroidism Screened  Coarctation of the Aorta Screened  Compliance Screened    Relevant Labs/Studies:    Latest Ref Rng & Units 11/19/2022   10:19 AM 09/03/2022    8:50 AM 08/29/2020    1:04 PM  Basic Labs  Sodium 134 - 144 mmol/L 139  140  140   Potassium 3.5 - 5.2 mmol/L 3.6  3.5  4.0   Creatinine 0.76 - 1.27 mg/dL 7.42  5.95  6.38        Latest Ref Rng & Units 11/19/2022   10:19 AM 09/03/2022    8:50 AM  Thyroid   TSH 0.450 - 4.500 uIU/mL 3.580  3.21        Disposition:    FU with MD/PharmD in 1 months    Medication Adjustments/Labs and Tests Ordered: Current medicines are reviewed at length with the patient today.  Concerns regarding medicines are outlined  above.  Orders Placed This Encounter  Procedures   Basic metabolic panel   EKG 12-Lead   Meds ordered this encounter  Medications   Olmesartan-amLODIPine-HCTZ (TRIBENZOR) 40-5-25 MG TABS    Sig: Take 1 tablet by mouth daily.    Dispense:  90 tablet    Refill:  3    D/C CHLORTHALIDONE AND ENALAPRIL   metoprolol succinate (TOPROL-XL) 100 MG 24 hr tablet    Sig: Take 1 tablet (100 mg total) by mouth daily. Take with or immediately following a meal.    Dispense:  90 tablet    Refill:  3    D/C METOPROLOL TART     Signed, Chilton Si, MD  06/25/2023 4:08 PM    [facility]

## 2023-06-30 ENCOUNTER — Telehealth (INDEPENDENT_AMBULATORY_CARE_PROVIDER_SITE_OTHER): Payer: MEDICAID | Admitting: Psychology

## 2023-06-30 DIAGNOSIS — F5089 Other specified eating disorder: Secondary | ICD-10-CM

## 2023-06-30 DIAGNOSIS — F909 Attention-deficit hyperactivity disorder, unspecified type: Secondary | ICD-10-CM

## 2023-06-30 NOTE — Progress Notes (Signed)
  Office: [phone]  /  Fax: [phone]    Date: June 30, 2023  Appointment Start Time: 2:37pm Duration: 20 minutes Provider: Lawerance Cruel, Psy.D. Type of Session: Individual Therapy  Location of Patient: Aunt's home (address obtained; private location) Location of Provider: Provider's Home (private office) Type of Contact: Telepsychological Visit via MyChart Video Visit  Session Content: This provider called Allen Harding at 2:34pm as he did not present for today's appointment. He stated he was trying to login via MyChart. He provided verbal consent for this provider to send  a link for the appointment via text. As such, today's appointment was initiated 7  minutes late. Allen Harding is a 28 y.o. male presenting for a follow-up appointment to address the previously established treatment goal of increasing coping skills.Today's appointment was a telepsychological visit. Allen Harding provided verbal consent for today's telepsychological appointment and he is aware he is responsible for securing confidentiality on his end of the session. Prior to proceeding with today's appointment, Allen Harding's physical location at the time of this appointment was obtained as well a phone number he could be reached at in the event of technical difficulties. Allen Harding and this provider participated in today's telepsychological service.   This provider conducted a brief check-in. Allen Harding shared he moved with his mother to his aunt's home. He acknowledged eating "more than usual," which he feels may be due to depression following his grandfather's passing and moving to a new home. He noted he continues to meet with his primary therapist to address the aforementioned. Allen Harding was engaged in problem solving to develop a plan to help cope with urges/cravings involving activities to relax, activities to distract, comforting places, people to call and connect with, and activities that help soothe senses. He stated he did not have any paper/pen;  therefore, he provided verbal consent for this provider to e-mail the plan developed today. Overall, Allen Harding was receptive to today's appointment as evidenced by openness to sharing, responsiveness to feedback, and willingness to implement discussed strategies .  Mental Status Examination:  Appearance: neat Behavior: appropriate to circumstances Mood: sad Affect: mood congruent Speech: WNL Eye Contact: intermittent Psychomotor Activity: WNL Gait: unable to assess Thought Process: linear, logical, and goal directed and denies suicidal, homicidal, and self-harm ideation, plan and intent  Thought Content/Perception: no hallucinations, delusions, bizarre thinking or behavior endorsed or observed Orientation: AAOx4 Memory/Concentration: intact Insight: fair Judgment: fair   Interventions:  Conducted a brief chart review Provided empathic reflections and validation Employed supportive psychotherapy interventions to facilitate reduced distress and to improve coping skills with identified stressors Engaged patient in problem solving  DSM-5 Diagnosis(es):  F50.89 Other Specified Feeding or Eating Disorder, Emotional Eating Behaviors and F90.9 Unspecified Attention-Deficit/Hyperactivity Disorder   Treatment Goal & Progress: During the initial appointment with this provider, the following treatment goal was established: increase coping skills. Allen Harding has demonstrated progress in his goal as evidenced by increased awareness of hunger patterns and increased awareness of triggers for emotional eating behaviors. Allen Harding also continues to demonstrate willingness to engage in learned skill(s).  Plan: The next appointment is scheduled for 07/21/2023 at 11:30am, which will be via MyChart Video Visit. The next session will focus on working towards the established treatment goal. Allen Harding will continue with his primary therapist.    Lawerance Cruel, PsyD

## 2023-07-09 ENCOUNTER — Ambulatory Visit (INDEPENDENT_AMBULATORY_CARE_PROVIDER_SITE_OTHER): Payer: MEDICAID | Admitting: Adult Health

## 2023-07-10 LAB — COMPREHENSIVE METABOLIC PANEL
ALT: 24 [IU]/L (ref 0–44)
AST: 20 [IU]/L (ref 0–40)
Albumin: 3.6 g/dL — ABNORMAL LOW (ref 4.3–5.2)
Alkaline Phosphatase: 103 [IU]/L (ref 44–121)
BUN/Creatinine Ratio: 12 (ref 9–20)
BUN: 9 mg/dL (ref 6–20)
Bilirubin Total: 0.4 mg/dL (ref 0.0–1.2)
CO2: 24 mmol/L (ref 20–29)
Calcium: 8.7 mg/dL (ref 8.7–10.2)
Chloride: 101 mmol/L (ref 96–106)
Creatinine, Ser: 0.74 mg/dL — ABNORMAL LOW (ref 0.76–1.27)
Globulin, Total: 3.6 g/dL (ref 1.5–4.5)
Glucose: 108 mg/dL — ABNORMAL HIGH (ref 70–99)
Potassium: 3.7 mmol/L (ref 3.5–5.2)
Sodium: 140 mmol/L (ref 134–144)
Total Protein: 7.2 g/dL (ref 6.0–8.5)
eGFR: 127 mL/min/{1.73_m2} (ref >59)

## 2023-07-10 LAB — INSULIN, RANDOM: INSULIN: 46.9 u[IU]/mL — ABNORMAL HIGH (ref 2.6–24.9)

## 2023-07-10 LAB — HEMOGLOBIN A1C
Est. average glucose Bld gHb Est-mCnc: 146 mg/dL
Hgb A1c MFr Bld: 6.7 % — ABNORMAL HIGH (ref 4.8–5.6)

## 2023-07-10 LAB — BASIC METABOLIC PANEL WITH GFR
BUN/Creatinine Ratio: 13 (ref 9–20)
BUN: 10 mg/dL (ref 6–20)
CO2: 21 mmol/L (ref 20–29)
Calcium: 8.8 mg/dL (ref 8.7–10.2)
Chloride: 99 mmol/L (ref 96–106)
Creatinine, Ser: 0.75 mg/dL — ABNORMAL LOW (ref 0.76–1.27)
Glucose: 107 mg/dL — ABNORMAL HIGH (ref 70–99)
Potassium: 3.7 mmol/L (ref 3.5–5.2)
Sodium: 139 mmol/L (ref 134–144)
eGFR: 126 mL/min/1.73

## 2023-07-10 LAB — VITAMIN B12: Vitamin B-12: 517 pg/mL (ref 232–1245)

## 2023-07-10 LAB — LIPID PANEL
Chol/HDL Ratio: 4.5 {ratio} (ref 0.0–5.0)
Cholesterol, Total: 196 mg/dL (ref 100–199)
HDL: 44 mg/dL (ref >39)
LDL Chol Calc (NIH): 130 mg/dL — ABNORMAL HIGH (ref 0–99)
Triglycerides: 122 mg/dL (ref 0–149)
VLDL Cholesterol Cal: 22 mg/dL (ref 5–40)

## 2023-07-10 LAB — MAGNESIUM: Magnesium: 1.8 mg/dL (ref 1.6–2.3)

## 2023-07-10 LAB — VITAMIN D 25 HYDROXY (VIT D DEFICIENCY, FRACTURES): Vit D, 25-Hydroxy: 21.1 ng/mL — ABNORMAL LOW (ref 30.0–100.0)

## 2023-07-12 LAB — CBC WITH DIFFERENTIAL/PLATELET
Basophils Absolute: 0.1 10*3/uL (ref 0.0–0.2)
Basos: 0 %
EOS (ABSOLUTE): 0.5 10*3/uL — ABNORMAL HIGH (ref 0.0–0.4)
Eos: 3 %
Hematocrit: 42.7 % (ref 37.5–51.0)
Hemoglobin: 13.9 g/dL (ref 13.0–17.7)
Immature Grans (Abs): 0.1 10*3/uL (ref 0.0–0.1)
Immature Granulocytes: 1 %
Lymphocytes Absolute: 4.2 10*3/uL — ABNORMAL HIGH (ref 0.7–3.1)
Lymphs: 24 %
MCH: 26.9 pg (ref 26.6–33.0)
MCHC: 32.6 g/dL (ref 31.5–35.7)
MCV: 83 fL (ref 79–97)
Monocytes Absolute: 0.8 10*3/uL (ref 0.1–0.9)
Monocytes: 5 %
Neutrophils Absolute: 12 10*3/uL — ABNORMAL HIGH (ref 1.4–7.0)
Neutrophils: 67 %
Platelets: 455 10*3/uL — ABNORMAL HIGH (ref 150–450)
RBC: 5.17 x10E6/uL (ref 4.14–5.80)
RDW: 14 % (ref 11.6–15.4)
WBC: 17.7 10*3/uL — ABNORMAL HIGH (ref 3.4–10.8)

## 2023-07-12 LAB — PSA: Prostate Specific Ag, Serum: 0.3 ng/mL (ref 0.0–4.0)

## 2023-07-12 LAB — VITAMIN D 25 HYDROXY (VIT D DEFICIENCY, FRACTURES): Vit D, 25-Hydroxy: 18.8 ng/mL — ABNORMAL LOW (ref 30.0–100.0)

## 2023-07-12 LAB — TESTOSTERONE, FREE, TOTAL, SHBG
Sex Hormone Binding: 18.1 nmol/L (ref 16.5–55.9)
Testosterone, Free: 3.6 pg/mL — ABNORMAL LOW (ref 9.3–26.5)
Testosterone: 63 ng/dL — ABNORMAL LOW (ref 264–916)

## 2023-07-16 ENCOUNTER — Ambulatory Visit (INDEPENDENT_AMBULATORY_CARE_PROVIDER_SITE_OTHER): Payer: MEDICAID | Admitting: Adult Health

## 2023-07-16 ENCOUNTER — Encounter (INDEPENDENT_AMBULATORY_CARE_PROVIDER_SITE_OTHER): Payer: Self-pay | Admitting: Adult Health

## 2023-07-16 DIAGNOSIS — F4321 Adjustment disorder with depressed mood: Secondary | ICD-10-CM

## 2023-07-16 DIAGNOSIS — Z6841 Body Mass Index (BMI) 40.0 and over, adult: Secondary | ICD-10-CM

## 2023-07-16 DIAGNOSIS — I152 Hypertension secondary to endocrine disorders: Secondary | ICD-10-CM | POA: Diagnosis not present

## 2023-07-16 DIAGNOSIS — E785 Hyperlipidemia, unspecified: Secondary | ICD-10-CM

## 2023-07-16 DIAGNOSIS — Z7985 Long-term (current) use of injectable non-insulin antidiabetic drugs: Secondary | ICD-10-CM

## 2023-07-16 DIAGNOSIS — E1159 Type 2 diabetes mellitus with other circulatory complications: Secondary | ICD-10-CM

## 2023-07-16 DIAGNOSIS — E1169 Type 2 diabetes mellitus with other specified complication: Secondary | ICD-10-CM | POA: Diagnosis not present

## 2023-07-16 DIAGNOSIS — E559 Vitamin D deficiency, unspecified: Secondary | ICD-10-CM

## 2023-07-16 DIAGNOSIS — E669 Obesity, unspecified: Secondary | ICD-10-CM

## 2023-07-16 DIAGNOSIS — Z7984 Long term (current) use of oral hypoglycemic drugs: Secondary | ICD-10-CM

## 2023-07-16 MED ORDER — VITAMIN D (ERGOCALCIFEROL) 1.25 MG (50000 UNIT) PO CAPS: 50000.0000 [IU] | ORAL_CAPSULE | ORAL | 0 refills | Status: DC

## 2023-07-16 NOTE — Progress Notes (Signed)
WEIGHT SUMMARY AND BIOMETRICS  Vitals Temp: 97.8 F (36.6 C) BP: (!) 161/123 Pulse Rate: (!) 109 SpO2: 97 %   Anthropometric Measurements Height: 5\' 10"  (1.778 m) Weight: (!) 410 lb (186 kg) BMI (Calculated): 58.83 Weight at Last Visit: 415lb Weight Lost Since Last Visit: 5lb Weight Gained Since Last Visit: 0lb Starting Weight: 441lb Total Weight Loss (lbs): 31 lb (14.1 kg)   Body Composition  Body Fat %: 46.4 % Fat Mass (lbs): 190.6 lbs Muscle Mass (lbs): 209.6 lbs Total Body Water (lbs): 154.6 lbs Visceral Fat Rating : 35   Other Clinical Data Fasting: No Labs: No Today's Visit #: 9 Starting Date: 11/19/22    Chief Complaint:   OBESITY Allen Harding is here to discuss his progress with his obesity treatment plan.  He is on the the Category 4 Plan and states he is following his eating plan approximately 50 % of the time.  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 Hypertension associated with type 2 diabetes mellitus (HCC) Discussed Labs BP CONTINUES TO BE UNCONTROLLED He vehemently denies acute cardiac sx's present  He has NOT been taking all antihypertensives as directed. Has been off for days, did TAKE today. He consumed a high Na+ lunch just prior to OV  07/09/2023 CMP-  Kidney and liver enzymes-normal Electrolytes- normal  1/22/20225 OV Notes with Cards/Dr. Duke Salvia # Hypertensive urgency: # Hypertension Uncontrolled with reported home readings around 150. Currently on multiple antihypertensive medications (Chlorthalidone, Enalapril, Metoprolol) with inconsistent adherence. Family history  of hypertension. No reported symptoms of hypertensive urgency/emergency. -Discontinue Chlorthalidone, Enalapril, and Metoprolol. -Start Tribenzor (combination pill) once daily in the morning. -Switch Metoprolol to Metoprolol Succinate 100mg  daily (long-acting form) to be taken in the morning. -Encourage patient to monitor blood pressure at home and record readings. -Order Basic Metabolic Panel (BMP) to be done in a week. -Schedule follow-up appointment in 1 month to reassess blood pressure control. -discuss sleep study at follow up  2. Type 2 diabetes mellitus with morbid obesity (HCC) Discussed Labs  Latest Reference Range & Units 07/09/23 11:08  Hemoglobin A1C 4.8 - 5.6 % 6.7 (H)  Est. average glucose Bld gHb Est-mCnc mg/dL 756  INSULIN 2.6 - 43.3 uIU/mL 46.9 (H)  (H): Data is abnormally high  T2D managed by Dr. Tennis Ship He denies sx's of hypoglycemia He is on daily Metfromin 500mg  and weekly Ozempic 0.5mg  Denies mass in neck, dysphagia, dyspepsia, persistent hoarseness, abdominal pain, or N/V/C  A1c at goal Insulin Resistance worsening  3. Vitamin D deficiency Discussed Labs  Latest Reference Range & Units 07/09/23 11:07 07/09/23 11:08  Vitamin D, 25-Hydroxy 30.0 - 100.0 ng/mL 18.8 (L) 21.1 (L)  (L): Data is abnormally low  He is on weekly Ergocalciferol- Vit D Level continues to be subtherapeutic  4. Hyperlipidemia associated with type 2 diabetes mellitus [facility]) Discussed Labs Lipid Panel     Component Value Date/Time   CHOL 196 07/09/2023 1108   TRIG 122 07/09/2023 1108   HDL 44 07/09/2023 1108   CHOLHDL 4.5 07/09/2023 1108   CHOLHDL 3.9 09/03/2022 0850   VLDL 21 08/08/2015 1152   LDLCALC 130 (  H) 07/09/2023 1108   LDLCALC 130 (H) 09/03/2022 0850   LABVLDL 22 07/09/2023 1108    The ASCVD Risk score (Arnett DK, et al., 2019) failed to calculate for the following reasons:   The 2019 ASCVD risk score is only valid for ages 65 to 68   LDL well above goal for  diabetic PCP manages Crestor 5mg  BP CONTINUES TO BE UNCONTROLLED He vehemently denies acute cardiac sx's present  5. Grief They have moved in with his great aunt. His great aunt works 3 jobs. He endorses stress with his a few family members over his grandfathers estate.  Assessment/Plan:   1. Hypertension associated with type 2 diabetes mellitus (HCC) IMPERATIVE that you take all antihypertensive therapy as directed. Avoid caffeine and all stimulants Discussed at length: Red Flag sx's and if ANY develop- SEEK IMMEDIATE medical assistance. Pt and pt's mother Allen Harding) both verbalized understanding/agreement  F/u with Pharm D as directed  DO NOT EXERCISE  2. Type 2 diabetes mellitus with morbid obesity (HCC) (Primary) F/u with Endo, re: antidiabetic regime Limit sugar/simple CHO Increase protein  3. Vitamin D deficiency Refill and INCREASE Vitamin D, Ergocalciferol, (DRISDOL) 1.25 MG (50000 UNIT) CAPS capsule Take 1 capsule (50,000 Units total) by mouth every 3 (three) days. Dispense: 12 capsule, Refills: 0 ordered   4. Hyperlipidemia associated with type 2 diabetes mellitus (HCC) F/u with PCP. Re: statin dose adjustment  5. Grief Surround self with positive family/friends Continue with therapists  6. BMI 60.0-69.9, adult (HCC)- current BMI 58.9  Allen Harding is not currently in the action stage of change. As such, his goal is to get back to weightloss efforts . He has agreed to the Category 4 Plan.   Exercise goals: No exercise has been prescribed at this time.  Behavioral modification strategies: increasing lean protein intake, decreasing simple carbohydrates, increasing vegetables, increasing water intake, decreasing sodium intake, no skipping meals, meal planning and cooking strategies, keeping healthy foods in the home, ways to avoid boredom eating, ways to avoid night time snacking, better snacking choices, emotional eating strategies, avoiding temptations, and planning for  success.  Allen Harding has agreed to follow-up with our clinic in 4 weeks. He was informed of the importance of frequent follow-up visits to maximize his success with intensive lifestyle modifications for his multiple health conditions.   Follow- up with CARDS, PCP, ENDO, PHARMS D   Objective:   Blood pressure (!) 161/123, pulse (!) 109, temperature 97.8 F (36.6 C), height 5\' 10"  (1.778 m), weight (!) 410 lb (186 kg), SpO2 97%. Body mass index is 58.83 kg/m.  General: Cooperative, alert, well developed, in no acute distress. HEENT: Conjunctivae and lids unremarkable. Cardiovascular: Regular rhythm.  Lungs: Normal work of breathing. Neurologic: No focal deficits.   Lab Results  Component Value Date   CREATININE 0.74 (L) 07/09/2023   CREATININE 0.75 (L) 07/09/2023   BUN 9 07/09/2023   BUN 10 07/09/2023   NA 140 07/09/2023   NA 139 07/09/2023   K 3.7 07/09/2023   K 3.7 07/09/2023   CL 101 07/09/2023   CL 99 07/09/2023   CO2 24 07/09/2023   CO2 21 07/09/2023   Lab Results  Component Value Date   ALT 24 07/09/2023   AST 20 07/09/2023   ALKPHOS 103 07/09/2023   BILITOT 0.4 07/09/2023   Lab Results  Component Value Date   HGBA1C 6.7 (H) 07/09/2023   HGBA1C 7.6 (H) 11/19/2022   HGBA1C 7.6 (H) 09/03/2022   HGBA1C 6.1 01/23/2021  HGBA1C 6.3 09/04/2020   Lab Results  Component Value Date   INSULIN 46.9 (H) 07/09/2023   INSULIN 43.8 (H) 11/19/2022   Lab Results  Component Value Date   TSH 3.580 11/19/2022   Lab Results  Component Value Date   CHOL 196 07/09/2023   HDL 44 07/09/2023   LDLCALC 130 (H) 07/09/2023   TRIG 122 07/09/2023   CHOLHDL 4.5 07/09/2023   Lab Results  Component Value Date   VD25OH 21.1 (L) 07/09/2023   VD25OH 18.8 (L) 07/09/2023   VD25OH 31.1 11/19/2022   Lab Results  Component Value Date   WBC 17.7 (H) 07/09/2023   HGB 13.9 07/09/2023   HCT 42.7 07/09/2023   MCV 83 07/09/2023   PLT 455 (H) 07/09/2023   No results found for: "IRON",  "TIBC", "FERRITIN"  Attestation Statements:   Reviewed by clinician on day of visit: allergies, medications, problem list, medical history, surgical history, family history, social history, and previous encounter notes.  I have reviewed the above documentation for accuracy and completeness, and I agree with the above. -  Niko Jakel d. Cynda Soule, NP-C

## 2023-07-20 ENCOUNTER — Encounter (HOSPITAL_BASED_OUTPATIENT_CLINIC_OR_DEPARTMENT_OTHER): Payer: Self-pay

## 2023-07-21 ENCOUNTER — Encounter: Payer: Self-pay | Admitting: "Endocrinology

## 2023-07-21 ENCOUNTER — Encounter (INDEPENDENT_AMBULATORY_CARE_PROVIDER_SITE_OTHER): Payer: MEDICAID | Admitting: Psychology

## 2023-07-21 ENCOUNTER — Ambulatory Visit (INDEPENDENT_AMBULATORY_CARE_PROVIDER_SITE_OTHER): Payer: MEDICAID | Admitting: "Endocrinology

## 2023-07-21 ENCOUNTER — Telehealth (INDEPENDENT_AMBULATORY_CARE_PROVIDER_SITE_OTHER): Payer: Self-pay | Admitting: Psychology

## 2023-07-21 ENCOUNTER — Encounter (INDEPENDENT_AMBULATORY_CARE_PROVIDER_SITE_OTHER): Payer: Self-pay

## 2023-07-21 VITALS — BP 144/98 | HR 92 | Ht 70.0 in | Wt >= 6400 oz

## 2023-07-21 DIAGNOSIS — E119 Type 2 diabetes mellitus without complications: Secondary | ICD-10-CM | POA: Diagnosis not present

## 2023-07-21 DIAGNOSIS — I10 Essential (primary) hypertension: Secondary | ICD-10-CM | POA: Insufficient documentation

## 2023-07-21 DIAGNOSIS — Z7985 Long-term (current) use of injectable non-insulin antidiabetic drugs: Secondary | ICD-10-CM

## 2023-07-21 DIAGNOSIS — E291 Testicular hypofunction: Secondary | ICD-10-CM | POA: Diagnosis not present

## 2023-07-21 DIAGNOSIS — E782 Mixed hyperlipidemia: Secondary | ICD-10-CM | POA: Diagnosis not present

## 2023-07-21 MED ORDER — ROSUVASTATIN CALCIUM 10 MG PO TABS: 5.0000 mg | ORAL_TABLET | Freq: Every day | ORAL | 1 refills | Status: AC

## 2023-07-21 MED ORDER — SEMAGLUTIDE (1 MG/DOSE) 4 MG/3ML ~~LOC~~ SOPN: 1.0000 mg | PEN_INJECTOR | SUBCUTANEOUS | 1 refills | Status: DC

## 2023-07-21 MED ORDER — TESTOSTERONE CYPIONATE 100 MG/ML IM SOLN: 100.0000 mg | INTRAMUSCULAR | 0 refills | Status: DC

## 2023-07-21 MED ORDER — SYRINGE/NEEDLE (DISP) 21G X 1-1/2" 3 ML MISC: 1 refills | Status: AC

## 2023-07-21 NOTE — Progress Notes (Signed)
 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   SOB (shortness of breath) on exertion    Trouble in sleeping    Weakness    Past Surgical History:  Procedure Laterality Date   CIRCUMCISION     CIRCUMCISION REVISION     FRENULECTOMY, LINGUAL     lipoma     ORCHIOPEXY     Social History   Socioeconomic History   Marital status: Single    Spouse name: Not on file   Number of children: Not on file   Years of education: Not on file   Highest education level: 12th grade  Occupational History   Not on file  Tobacco Use   Smoking status: Former    Current packs/day: 0.00    Types: Cigarettes    Quit date: 01/09/2014    Years since quitting: 9.5   Smokeless tobacco: Never  Vaping Use   Vaping status: Never Used  Substance and Sexual Activity   Alcohol use: Yes    Comment: socially   Drug use: No   Sexual activity: Not on file  Other Topics Concern   Not on file  Social History Narrative   Lives with mom, sister, 2 nieces, grandparents and sister's fiance.    Social Drivers of Corporate investment banker Strain: Low Risk  (01/08/2023)   Overall Financial Resource Strain (CARDIA)    Difficulty of Paying Living Expenses: Not hard at all  Food Insecurity: No Food Insecurity (06/25/2023)   Hunger Vital Sign    Worried About Running Out of Food in the Last Year: Never true    Ran Out of Food in the Last Year: Never true  Transportation Needs: No Transportation Needs (06/25/2023)   PRAPARE - Administrator, Civil Service (Medical): No     Lack of Transportation (Non-Medical): No  Physical Activity: Insufficiently Active (06/25/2023)   Exercise Vital Sign    Days of Exercise per Week: 3 days    Minutes of Exercise per Session: 30 min  Stress: No Stress Concern Present (03/24/2023)   [employer] of Occupational Health - Occupational Stress Questionnaire    Feeling of Stress : Only a little  Social Connections: Moderately Integrated (03/24/2023)   Social Connection and Isolation Panel [NHANES]    Frequency of Communication with Friends and Family: More than three times a week    Frequency of Social Gatherings with Friends and Family: More than three times a week    Attends Religious Services: More than 4 times per year    Active Member of [employer] or Organizations: Yes    Attends Banker Meetings: More than 4 times per year    Marital Status: Never married   Outpatient Encounter Medications as of 07/21/2023  Medication Sig   Semaglutide, 1 MG/DOSE, 4 MG/3ML SOPN Inject 1 mg as directed once a week.   Accu-Chek Softclix Lancets lancets Use as directed to check  glucose daily. DX: E11.65   Blood Glucose Monitoring Suppl (ACCU-CHEK GUIDE) w/Device KIT Use as directed to check glucose daily. DX: E11.65   Blood Pressure Monitoring KIT 1 Units by Does not apply route daily.   glucose blood (ACCU-CHEK GUIDE) test strip Use as directed to check glucose daily. DX: E11.65   metFORMIN (GLUCOPHAGE) 500 MG tablet Take 1 tablet (500 mg total) by mouth daily with breakfast.   metoprolol succinate (TOPROL-XL) 100 MG 24 hr tablet Take 1 tablet (100 mg total) by mouth daily. Take with or immediately following a meal.   Multiple Vitamin (MULTIVITAMIN) tablet Take 1 tablet by mouth daily.   Olmesartan-amLODIPine-HCTZ (TRIBENZOR) 40-5-25 MG TABS Take 1 tablet by mouth daily.   oxybutynin (DITROPAN-XL) 10 MG 24 hr tablet TAKE 1 TABLET BY MOUTH DAILY.   rosuvastatin (CRESTOR) 10 MG tablet Take 0.5 tablets (5 mg total) by mouth  daily.   SYRINGE-NEEDLE, DISP, 3 ML 21G X 1-1/2" 3 ML MISC Use to inject testosterone every week   testosterone cypionate (DEPOTESTOTERONE CYPIONATE) 100 MG/ML injection Inject 1 mL (100 mg total) into the muscle every 14 (fourteen) days. For IM use only   Vitamin D, Ergocalciferol, (DRISDOL) 1.25 MG (50000 UNIT) CAPS capsule Take 1 capsule (50,000 Units total) by mouth every 3 (three) days.   [DISCONTINUED] rosuvastatin (CRESTOR) 5 MG tablet Take 1 tablet (5 mg total) by mouth daily.   [DISCONTINUED] Semaglutide,0.25 or 0.5MG /DOS, (OZEMPIC, 0.25 OR 0.5 MG/DOSE,) 2 MG/3ML SOPN Inject 0.5 mg into the skin once a week.   [DISCONTINUED] SYRINGE-NEEDLE, DISP, 3 ML 21G X 1-1/2" 3 ML MISC Use to inject testosterone every week   [DISCONTINUED] testosterone cypionate (DEPOTESTOTERONE CYPIONATE) 100 MG/ML injection Inject 1 mL (100 mg total) into the muscle every 14 (fourteen) days. For IM use only   No facility-administered encounter medications on file as of 07/21/2023.   ALLERGIES: No Known Allergies VACCINATION STATUS: Immunization History  Administered Date(s) Administered   DTaP 06/11/1995, 08/06/1995, 09/24/1995, 06/30/1996, 09/24/1999   H1N1 11/24/2008   HIB (PRP-OMP) 06/11/1995, 08/06/1995, 09/24/1995, 06/30/1996   HPV Quadrivalent 02/08/2014   Hepatitis A, Ped/Adol-2 Dose 02/03/2013, 08/19/2013   Hepatitis B 27-Aug-1994, 05/07/1995, 09/24/1995   IPV 06/11/1995, 08/06/1995, 09/24/1995, 09/24/1999   Influenza Whole 03/22/2009, 06/17/2011, 07/23/2012   Influenza, Seasonal, Injecte, Preservative Fre 03/26/2023   Influenza,inj,Quad PF,6+ Mos 05/02/2015, 03/30/2019, 03/30/2020   MMR 03/31/1996, 09/24/1999   Meningococcal Conjugate 11/24/2008, 08/19/2013   Td 01/07/2006   Tdap 01/07/2006, 03/26/2023   Varicella 03/31/1996, 06/01/1997    Diabetes   28 year old male patient with medical history as above. 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 -He was seen in this clinic until August 2022 and he never returned for follow-up care.  See notes from previous visits.  - He has been following at [facility] until March 2017 with Dr. Vanessa [place]. -He does not know for sure when he was started on testosterone therapy.   -He has not taken any testosterone for more than a year.  Due to clinically significant hypogonadism with total testosterone of 42, he was resumed on testosterone 100 mg IM every 14 days during his last visit.  For unclear reasons, he did not initiate this treatment.  He wishes to be resumed.  His recent A1c was 6.7% improving from 7.6%.  He is responding to Ozempic currently 0.5 mg subcutaneously weekly.  He presents with 30 pounds of weight loss.  He remains on metformin 500 mg p.o. once a day as well.  He feels better. -He has dealt with heavy weight almost all of his life, admits to dietary indiscretions including consumption of large quantities of sweetened beverages.  -He remains on Crestor 5 mg p.o. nightly for dyslipidemia.   -He underwent orchidopexy for right-sided undescended testes. -He denies testicular injury, radiation, infection, STD. -He denies any history of head injury.    Objective:    BP (!) 144/98   Pulse 92   Ht 5\' 10"  (1.778 m)   Wt (!) 416 lb (188.7 kg)   BMI 59.69 kg/m   Wt Readings from Last 3 Encounters:  07/21/23 (!) 416 lb (188.7 kg)  07/16/23 (!) 410 lb (186 kg)  06/25/23 (!) 421 lb 1.6 oz (191 kg)      CMP     Component Value Date/Time   NA 140 07/09/2023 1108   NA 139 07/09/2023 1108   K 3.7 07/09/2023 1108   K 3.7 07/09/2023 1108   CL 101 07/09/2023 1108   CL 99 07/09/2023 1108   CO2 24 07/09/2023 1108   CO2 21 07/09/2023 1108   GLUCOSE 108 (H) 07/09/2023 1108   GLUCOSE 107 (H) 07/09/2023 1108   GLUCOSE 149 (H) 09/03/2022 0850   BUN 9 07/09/2023 1108   BUN 10  07/09/2023 1108   CREATININE 0.74 (L) 07/09/2023 1108   CREATININE 0.75 (L) 07/09/2023 1108   CREATININE 0.65 09/03/2022 0850   CALCIUM 8.7 07/09/2023 1108   CALCIUM 8.8 07/09/2023 1108   PROT 7.2 07/09/2023 1108   ALBUMIN 3.6 (L) 07/09/2023 1108   AST 20 07/09/2023 1108   ALT 24 07/09/2023 1108   ALKPHOS 103 07/09/2023 1108   BILITOT 0.4 07/09/2023 1108   GFRNONAA >60 03/22/2020 1320   GFRNONAA 138 01/26/2020 1047   GFRAA 160 01/26/2020 1047   Diabetic Labs (most recent): Lab Results  Component Value Date   HGBA1C 6.7 (H) 07/09/2023   HGBA1C 7.6 (H) 11/19/2022   HGBA1C 7.6 (H) 09/03/2022   MICROALBUR 2.7 08/26/2022   MICROALBUR 11.6 01/26/2020   MICROALBUR 17.4 08/21/2018    Lipid Panel     Component Value Date/Time   CHOL 196 07/09/2023 1108   TRIG 122 07/09/2023 1108   HDL 44 07/09/2023 1108   CHOLHDL 4.5 07/09/2023 1108   CHOLHDL 3.9 09/03/2022 0850   VLDL 21 08/08/2015 1152   LDLCALC 130 (H) 07/09/2023 1108   LDLCALC 130 (H) 09/03/2022 0850   Recent Results (from the past 2160 hours)  CBC with Differential     Status: Abnormal   Collection Time: 04/24/23  1:43 PM  Result Value Ref Range   WBC 18.5 (H) 4.0 - 10.5 K/uL   RBC 5.49 4.22 - 5.81 MIL/uL   Hemoglobin 14.1 13.0 - 17.0 g/dL   HCT 16.1 09.6 - 04.5 %   MCV 81.6 80.0 - 100.0 fL   MCH 25.7 (L) 26.0 - 34.0 pg   MCHC 31.5 30.0 - 36.0 g/dL   RDW 40.9 81.1 - 91.4 %   Platelets 437 (H) 150 - 400 K/uL   nRBC 0.0 0.0 - 0.2 %   Neutrophils Relative % 64 %   Neutro Abs 12.0 (H) 1.7 - 7.7 K/uL   Lymphocytes Relative 27 %   Lymphs Abs 5.0 (H) 0.7 - 4.0 K/uL   Monocytes Relative 6 %   Monocytes Absolute 1.0 0.1 - 1.0 K/uL   Eosinophils Relative 2 %   Eosinophils Absolute  0.3 0.0 - 0.5 K/uL   Basophils Relative 0 %   Basophils Absolute 0.1 0.0 - 0.1 K/uL   Immature Granulocytes 1 %   Abs Immature Granulocytes 0.09 (H) 0.00 - 0.07 K/uL    Comment: Performed at [facility], [address] [place],  [place] [address]  Lactate dehydrogenase     Status: None   Collection Time: 04/24/23  1:43 PM  Result Value Ref Range   LDH 152 98 - 192 U/L    Comment: Performed at [facility], [address] [place], [place] [address]  HM DIABETES EYE EXAM     Status: None   Collection Time: 05/21/23 12:00 AM  Result Value Ref Range   HM Diabetic Eye Exam No Retinopathy No Retinopathy  Lactate dehydrogenase     Status: None   Collection Time: 06/05/23  1:46 PM  Result Value Ref Range   LDH 149 98 - 192 U/L    Comment: Performed at [facility] [address], [place], [place] [address]  CBC with Differential     Status: Abnormal   Collection Time: 06/05/23  1:46 PM  Result Value Ref Range   WBC 13.7 (H) 4.0 - 10.5 K/uL   RBC 5.24 4.22 - 5.81 MIL/uL   Hemoglobin 14.1 13.0 - 17.0 g/dL   HCT 41.3 24.4 - 01.0 %   MCV 83.2 80.0 - 100.0 fL   MCH 26.9 26.0 - 34.0 pg   MCHC 32.3 30.0 - 36.0 g/dL   RDW 27.2 53.6 - 64.4 %   Platelets 380 150 - 400 K/uL   nRBC 0.0 0.0 - 0.2 %   Neutrophils Relative % 64 %   Neutro Abs 8.6 (H) 1.7 - 7.7 K/uL   Lymphocytes Relative 29 %   Lymphs Abs 4.0 0.7 - 4.0 K/uL   Monocytes Relative 5 %   Monocytes Absolute 0.7 0.1 - 1.0 K/uL   Eosinophils Relative 2 %   Eosinophils Absolute 0.3 0.0 - 0.5 K/uL   Basophils Relative 0 %   Basophils Absolute 0.1 0.0 - 0.1 K/uL   Immature Granulocytes 0 %   Abs Immature Granulocytes 0.06 0.00 - 0.07 K/uL    Comment: Performed at [facility], [address], [place], [place] [address]  Testosterone, Free, Total, SHBG     Status: Abnormal   Collection Time: 07/09/23 11:07 AM  Result Value Ref Range   Testosterone 63 (L) 264 - 916 ng/dL    Comment: Adult male reference interval is based on a population of healthy nonobese males (BMI <30) between 71 and 75 years old. Travison, et.al. JCEM [phone]. PMID: 51884166.    Testosterone, Free 3.6 (L) 9.3 - 26.5 pg/mL   Sex Hormone Binding 18.1 16.5 - 55.9 nmol/L  CBC with  Differential/Platelet     Status: Abnormal   Collection Time: 07/09/23 11:07 AM  Result Value Ref Range   WBC 17.7 (H) 3.4 - 10.8 x10E3/uL   RBC 5.17 4.14 - 5.80 x10E6/uL   Hemoglobin 13.9 13.0 - 17.7 g/dL   Hematocrit 06.3 01.6 - 51.0 %   MCV 83 79 - 97 fL   MCH 26.9 26.6 - 33.0 pg   MCHC 32.6 31.5 - 35.7 g/dL   RDW 01.0 93.2 - 35.5 %   Platelets 455 (H) 150 - 450 x10E3/uL   Neutrophils 67 Not Estab. %   Lymphs 24 Not Estab. %   Monocytes 5 Not Estab. %   Eos 3 Not Estab. %   Basos  0 Not Estab. %   Neutrophils Absolute 12.0 (H) 1.4 - 7.0 x10E3/uL   Lymphocytes Absolute 4.2 (H) 0.7 - 3.1 x10E3/uL   Monocytes Absolute 0.8 0.1 - 0.9 x10E3/uL   EOS (ABSOLUTE) 0.5 (H) 0.0 - 0.4 x10E3/uL   Basophils Absolute 0.1 0.0 - 0.2 x10E3/uL   Immature Granulocytes 1 Not Estab. %   Immature Grans (Abs) 0.1 0.0 - 0.1 x10E3/uL  VITAMIN D 25 Hydroxy (Vit-D Deficiency, Fractures)     Status: Abnormal   Collection Time: 07/09/23 11:07 AM  Result Value Ref Range   Vit D, 25-Hydroxy 18.8 (L) 30.0 - 100.0 ng/mL    Comment: Vitamin D deficiency has been defined by the Institute of Medicine and an Endocrine Society practice guideline as a level of serum 25-OH vitamin D less than 20 ng/mL (1,2). The Endocrine Society went on to further define vitamin D insufficiency as a level between 21 and 29 ng/mL (2). 1. IOM (Institute of Medicine). 2010. Dietary reference    intakes for calcium and D. [place]: The    [employer]. 2. Holick MF, Binkley Brownell, Bischoff-Ferrari HA, et al.    Evaluation, treatment, and prevention of vitamin D    deficiency: an Endocrine Society clinical practice    guideline. JCEM. 2011 Jul; 96(7):1911-30.   PSA     Status: None   Collection Time: 07/09/23 11:07 AM  Result Value Ref Range   Prostate Specific Ag, Serum 0.3 0.0 - 4.0 ng/mL    Comment: Roche ECLIA methodology. According to the American Urological Association, Serum PSA should decrease and remain at  undetectable levels after radical prostatectomy. The AUA defines biochemical recurrence as an initial PSA value 0.2 ng/mL or greater followed by a subsequent confirmatory PSA value 0.2 ng/mL or greater. Values obtained with different assay methods or kits cannot be used interchangeably. Results cannot be interpreted as absolute evidence of the presence or absence of malignant disease.   Comprehensive metabolic panel     Status: Abnormal   Collection Time: 07/09/23 11:08 AM  Result Value Ref Range   Glucose 108 (H) 70 - 99 mg/dL   BUN 9 6 - 20 mg/dL   Creatinine, Ser 4.09 (L) 0.76 - 1.27 mg/dL   eGFR 811 >91 YN/WGN/5.62   BUN/Creatinine Ratio 12 9 - 20   Sodium 140 134 - 144 mmol/L   Potassium 3.7 3.5 - 5.2 mmol/L   Chloride 101 96 - 106 mmol/L   CO2 24 20 - 29 mmol/L   Calcium 8.7 8.7 - 10.2 mg/dL   Total Protein 7.2 6.0 - 8.5 g/dL   Albumin 3.6 (L) 4.3 - 5.2 g/dL   Globulin, Total 3.6 1.5 - 4.5 g/dL   Bilirubin Total 0.4 0.0 - 1.2 mg/dL   Alkaline Phosphatase 103 44 - 121 IU/L   AST 20 0 - 40 IU/L   ALT 24 0 - 44 IU/L  Hemoglobin A1c     Status: Abnormal   Collection Time: 07/09/23 11:08 AM  Result Value Ref Range   Hgb A1c MFr Bld 6.7 (H) 4.8 - 5.6 %    Comment:          Prediabetes: 5.7 - 6.4          Diabetes: >6.4          Glycemic control for adults with diabetes: <7.0    Est. average glucose Bld gHb Est-mCnc 146 mg/dL  Insulin, random     Status: Abnormal   Collection Time: 07/09/23 11:08  AM  Result Value Ref Range   INSULIN 46.9 (H) 2.6 - 24.9 uIU/mL  VITAMIN D 25 Hydroxy (Vit-D Deficiency, Fractures)     Status: Abnormal   Collection Time: 07/09/23 11:08 AM  Result Value Ref Range   Vit D, 25-Hydroxy 21.1 (L) 30.0 - 100.0 ng/mL    Comment: Vitamin D deficiency has been defined by the Institute of Medicine and an Endocrine Society practice guideline as a level of serum 25-OH vitamin D less than 20 ng/mL (1,2). The Endocrine Society went on to further define  vitamin D insufficiency as a level between 21 and 29 ng/mL (2). 1. IOM (Institute of Medicine). 2010. Dietary reference    intakes for calcium and D. [place]: The    [employer]. 2. Holick MF, Binkley Matlock, Bischoff-Ferrari HA, et al.    Evaluation, treatment, and prevention of vitamin D    deficiency: an Endocrine Society clinical practice    guideline. JCEM. 2011 Jul; 96(7):1911-30.   Lipid panel     Status: Abnormal   Collection Time: 07/09/23 11:08 AM  Result Value Ref Range   Cholesterol, Total 196 100 - 199 mg/dL   Triglycerides 045 0 - 149 mg/dL   HDL 44 >40 mg/dL   VLDL Cholesterol Cal 22 5 - 40 mg/dL   LDL Chol Calc (NIH) 981 (H) 0 - 99 mg/dL   Chol/HDL Ratio 4.5 0.0 - 5.0 ratio    Comment:                                   T. Chol/HDL Ratio                                             Men  Women                               1/2 Avg.Risk  3.4    3.3                                   Avg.Risk  5.0    4.4                                2X Avg.Risk  9.6    7.1                                3X Avg.Risk 23.4   11.0   Vitamin B12     Status: None   Collection Time: 07/09/23 11:08 AM  Result Value Ref Range   Vitamin B-12 517 232 - 1,245 pg/mL  Magnesium     Status: None   Collection Time: 07/09/23 11:08 AM  Result Value Ref Range   Magnesium 1.8 1.6 - 2.3 mg/dL  Basic metabolic panel     Status: Abnormal   Collection Time: 07/09/23 11:08 AM  Result Value Ref Range   Glucose 107 (H) 70 - 99 mg/dL   BUN 10 6 - 20 mg/dL   Creatinine, Ser 1.91 (L) 0.76 - 1.27 mg/dL   eGFR 478 >29  mL/min/1.73   BUN/Creatinine Ratio 13 9 - 20   Sodium 139 134 - 144 mmol/L   Potassium 3.7 3.5 - 5.2 mmol/L   Chloride 99 96 - 106 mmol/L   CO2 21 20 - 29 mmol/L   Calcium 8.8 8.7 - 10.2 mg/dL     Assessment & Plan:   1.  Type 2 diabetes -new diagnosis  -His recent A1c was 6.7% improving from 7.6%.  In light of his major intolerance dysfunction including morbid obesity,  dyslipidemia, sedentary life, he needed additional intervention with GLP-1 receptor agonist.  He is responding to Ozempic.  I discussed and increase Ozempic to 1 mg subcutaneously weekly.  Side effects and precautions discussed with him.   He will continue to benefit from low-dose metformin, 500 mg p.o. daily at breakfast. - he acknowledges that there is a room for improvement in his food and drink choices. - Suggestion is made for him to avoid simple carbohydrates  from his diet including Cakes, Sweet Desserts, Ice Cream, Soda (diet and regular), Sweet Tea, Candies, Chips, Cookies, Store Bought Juices, Alcohol , Artificial Sweeteners,  Coffee Creamer, and "Sugar-free" Products, Lemonade. This will help patient to have more stable blood glucose profile and potentially avoid unintended weight gain.  His current BMI is 59.69 improving from 63.82-his major medical problem. Whole food plant-based diet was briefly discussed-see above.  2. Hypogonadism  - Etiology most likely multifactorial including chromosomal abnormality and history of undescended testes.  - He has required testosterone supplement for several years now. I have reviewed his EMR records and found out that he had low testosterone starting from at least from 2012.  -I have not seen FSH/LH levels, would have  been useful prior to initiation of testosterone therapy however the utility of that test now is unremarkable. 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  He wishes to be resumed on testosterone supplement treatment.  In light of total testosterone at 63, he will benefit from resumption of testosterone as he wishes.  I discussed initiated testosterone 100 mg IM every 14 days.   Therapeutic goal is to keep his total testosterone above 250.  -He will need repeat testosterone measurement along with CBC during his next visit. -Safe and proper utility of androgen replacement therapy was discussed with him.   3. Undescended right  testicle - He is status post orchidopexy of right testicle. The details of his surgical history are not available for review.  4) hypertension -His blood pressure is not controlled to target.  He is advised to continue chlorthalidone 25 mg p.o. daily, enalapril 10 mg p.o. daily.   5) hyperlipidemia  -His most recent lipid panel showed loss of control of dyslipidemia with LDL at 130.  Admittedly, he has not been consistent with his Crestor.  I discussed and increased his Crestor to 10 mg p.o.  nightly.  Side effects and precautions discussed with him.     He is advised to continue close follow-up with his PMD Dr. Felecia Shelling.   I spent  26  minutes in the care of the patient today including review of labs from Thyroid Function, CMP, and other relevant labs ; imaging/biopsy records (current and previous including abstractions from other facilities); face-to-face time discussing  his lab results and symptoms, medications doses, his options of short and long term treatment based on the latest standards of care / guidelines;   and documenting the encounter.  Allen Harding  participated in the discussions, expressed understanding, and voiced agreement with  the above plans.  All questions were answered to his satisfaction. he is encouraged to contact clinic should he have any questions or concerns prior to his return visit.    Follow up plan: Return in about 4 months (around 11/18/2023) for Fasting Labs  in AM B4 8, A1c -[place].  Marquis Lunch, MD Phone: [phone]  Fax: [phone]   This note was partially dictated with voice recognition software. Similar sounding words can be transcribed inadequately or may not  be corrected upon review.  07/21/2023, 5:33 PM

## 2023-07-21 NOTE — Progress Notes (Signed)
 Entered in error

## 2023-07-21 NOTE — Telephone Encounter (Signed)
  Office: [phone]  /  Fax: [phone]  Date of Encounter: July 21, 2023  Time of Encounter: 11:33am; 11:38am Duration of Encounter: ~1.5 minutes; ~5 minutes Provider: Lawerance Cruel, PsyD  CONTENT: This provider called the phone number on file and Tyra's legal guardian/mother, Liam Graham, informed this provider that Virlan's phone is not working but that she would contact him to sign on for today's appointment. Andri joined the [employer] at 11:38am; however, he was experiencing technical difficulties and unable to connect with both video and audio capabilities. As such, he was receptive to rescheduling and communication was completed via the chat on MyChart Video Visit. No evidence or endorsement of safety concerns. All questions/concerns addressed.   PLAN: Jeremiyah is scheduled for an appointment on 08/11/2023 at 3:30pm via MyChart Video Visit.

## 2023-07-21 NOTE — Patient Instructions (Signed)

## 2023-07-22 ENCOUNTER — Telehealth: Payer: Self-pay

## 2023-07-22 ENCOUNTER — Other Ambulatory Visit: Payer: Self-pay | Admitting: "Endocrinology

## 2023-07-22 MED ORDER — TESTOSTERONE CYPIONATE 200 MG/ML IM SOLN: 100.0000 mg | INTRAMUSCULAR | 0 refills | Status: DC

## 2023-07-22 NOTE — Telephone Encounter (Signed)
 Allen Harding with [facility] called stating the Depotestosterone 100mg /mL is on back order. States he does have to 200mg //mL but will need a new Rx with adjusted dose.

## 2023-07-30 ENCOUNTER — Other Ambulatory Visit: Payer: Self-pay

## 2023-07-30 DIAGNOSIS — D75839 Thrombocytosis, unspecified: Secondary | ICD-10-CM

## 2023-07-30 DIAGNOSIS — D72829 Elevated white blood cell count, unspecified: Secondary | ICD-10-CM

## 2023-07-31 ENCOUNTER — Inpatient Hospital Stay: Payer: MEDICAID

## 2023-08-01 ENCOUNTER — Inpatient Hospital Stay: Payer: MEDICAID | Attending: Oncology

## 2023-08-01 DIAGNOSIS — D75839 Thrombocytosis, unspecified: Secondary | ICD-10-CM | POA: Diagnosis present

## 2023-08-01 DIAGNOSIS — D72829 Elevated white blood cell count, unspecified: Secondary | ICD-10-CM | POA: Insufficient documentation

## 2023-08-01 LAB — CBC WITH DIFFERENTIAL/PLATELET
Abs Immature Granulocytes: 0.14 10*3/uL — ABNORMAL HIGH (ref 0.00–0.07)
Basophils Absolute: 0.1 10*3/uL (ref 0.0–0.1)
Basophils Relative: 0 %
Eosinophils Absolute: 0.5 10*3/uL (ref 0.0–0.5)
Eosinophils Relative: 3 %
HCT: 42.1 % (ref 39.0–52.0)
Hemoglobin: 13.7 g/dL (ref 13.0–17.0)
Immature Granulocytes: 1 %
Lymphocytes Relative: 27 %
Lymphs Abs: 4.5 10*3/uL — ABNORMAL HIGH (ref 0.7–4.0)
MCH: 27.1 pg (ref 26.0–34.0)
MCHC: 32.5 g/dL (ref 30.0–36.0)
MCV: 83.4 fL (ref 80.0–100.0)
Monocytes Absolute: 0.8 10*3/uL (ref 0.1–1.0)
Monocytes Relative: 5 %
Neutro Abs: 10.5 10*3/uL — ABNORMAL HIGH (ref 1.7–7.7)
Neutrophils Relative %: 64 %
Platelets: 400 10*3/uL (ref 150–400)
RBC: 5.05 MIL/uL (ref 4.22–5.81)
RDW: 13.9 % (ref 11.5–15.5)
WBC: 16.4 10*3/uL — ABNORMAL HIGH (ref 4.0–10.5)
nRBC: 0 % (ref 0.0–0.2)

## 2023-08-01 LAB — LACTATE DEHYDROGENASE: LDH: 147 U/L (ref 98–192)

## 2023-08-04 ENCOUNTER — Ambulatory Visit: Payer: MEDICAID

## 2023-08-06 ENCOUNTER — Ambulatory Visit: Payer: MEDICAID

## 2023-08-07 ENCOUNTER — Inpatient Hospital Stay: Payer: MEDICAID | Attending: Oncology | Admitting: Oncology

## 2023-08-07 VITALS — BP 183/125 | HR 118 | Temp 98.4°F | Ht 70.0 in | Wt >= 6400 oz

## 2023-08-07 DIAGNOSIS — D75839 Thrombocytosis, unspecified: Secondary | ICD-10-CM | POA: Diagnosis present

## 2023-08-07 DIAGNOSIS — D72828 Other elevated white blood cell count: Secondary | ICD-10-CM | POA: Diagnosis not present

## 2023-08-07 DIAGNOSIS — D72829 Elevated white blood cell count, unspecified: Secondary | ICD-10-CM

## 2023-08-07 NOTE — Progress Notes (Signed)
 [facility] [address], [place] [address]   CLINIC:  Medical Oncology/Hematology  PCP:  [name], FNP [address] [place] [address]  [phone]  REASON FOR VISIT:  Follow-up for neutrophilic leukocytosis and thrombocytosis  PRIOR THERAPY: none  CURRENT THERAPY: surveillance  INTERVAL HISTORY:  Allen Harding, a 28 y.o. male, seen for follow-up of leukocytosis.  Denies any fevers, night sweats or unintentional weight loss in the last 1 year.  Energy levels are 50%.  Appetite 80%.  He presents today with his mother.  Patient was last seen on 04/24/2023.  Since his last visit, he denies any hospitalizations, surgeries or changes to his baseline health.  Denies any recurrent infections systemic steroid use.  Patient was previously on Ozempic and lost between 30 and 40 pounds.  He is currently not on Ozempic due to cost.  Reports he and his mother have been under a lot of stress due to the loss of his grandfather.  He is on disability and has limited income.  Reports there are several stressors in his life along with his health issues have made him very anxious over the past couple of months.  Overall, he feels well.  No recent infections.  No new medications.  No pain.  REVIEW OF SYSTEMS:  Review of Systems  Constitutional:  Negative for chills, diaphoresis, fatigue and fever.  Psychiatric/Behavioral:  Positive for depression and sleep disturbance. The patient is nervous/anxious.     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   SOB (shortness of breath) on exertion    Trouble in sleeping    Weakness    Past  Surgical History:  Procedure Laterality Date   CIRCUMCISION     CIRCUMCISION REVISION     FRENULECTOMY, LINGUAL     lipoma     ORCHIOPEXY      SOCIAL HISTORY:  Social History   Socioeconomic History   Marital status: Single    Spouse name: Not on file   Number of children: Not on file   Years of education: Not on file   Highest education level: 12th grade  Occupational History   Not on file  Tobacco Use   Smoking status: Former    Current packs/day: 0.00    Types: Cigarettes    Quit date: 01/09/2014    Years since quitting: 9.5   Smokeless tobacco: Never  Vaping Use   Vaping status: Never Used  Substance and Sexual Activity   Alcohol use: Yes    Comment: socially   Drug use: No   Sexual activity: Not on file  Other Topics Concern   Not on file  Social History Narrative   Lives with mom, sister, 2 nieces, grandparents and sister's fiance.    Social Drivers of Corporate investment banker Strain: Low Risk  (01/08/2023)   Overall Financial Resource Strain (CARDIA)    Difficulty of Paying Living Expenses: Not hard at all  Food Insecurity: No Food Insecurity (06/25/2023)   Hunger Vital Sign    Worried About Running Out of  Food in the Last Year: Never true    Ran Out of Food in the Last Year: Never true  Transportation Needs: No Transportation Needs (06/25/2023)   PRAPARE - Administrator, Civil Service (Medical): No    Lack of Transportation (Non-Medical): No  Physical Activity: Insufficiently Active (06/25/2023)   Exercise Vital Sign    Days of Exercise per Week: 3 days    Minutes of Exercise per Session: 30 min  Stress: No Stress Concern Present (03/24/2023)   [employer] of Occupational Health - Occupational Stress Questionnaire    Feeling of Stress : Only a little  Social Connections: Moderately Integrated (03/24/2023)   Social Connection and Isolation Panel [NHANES]    Frequency of Communication with Friends and Family: More than three times a  week    Frequency of Social Gatherings with Friends and Family: More than three times a week    Attends Religious Services: More than 4 times per year    Active Member of [employer] or Organizations: Yes    Attends Engineer, structural: More than 4 times per year    Marital Status: Never married  Catering manager Violence: Not on file    FAMILY HISTORY:  Family History  Problem Relation Age of Onset   Obesity Mother    Diabetes Mother    Hypertension Mother    Hyperlipidemia Mother    Depression Mother    Hypertension Sister    Obesity Sister    Hypertension Maternal Aunt    Heart attack Maternal Uncle    Hypertension Maternal Uncle    Obesity Maternal Grandmother    Diabetes Maternal Grandmother    Cancer Maternal Grandmother    Stroke Maternal Grandmother    Heart attack Maternal Grandfather    Cancer Maternal Grandfather    Obesity Maternal Grandfather     CURRENT MEDICATIONS:  Current Outpatient Medications  Medication Sig Dispense Refill   Accu-Chek Softclix Lancets lancets Use as directed to check glucose daily. DX: E11.65 200 each 5   Blood Glucose Monitoring Suppl (ACCU-CHEK GUIDE) w/Device KIT Use as directed to check glucose daily. DX: E11.65 1 kit 1   Blood Pressure Monitoring KIT 1 Units by Does not apply route daily. 1 kit 0   glucose blood (ACCU-CHEK GUIDE) test strip Use as directed to check glucose daily. DX: E11.65 200 each 5   metFORMIN (GLUCOPHAGE) 500 MG tablet Take 1 tablet (500 mg total) by mouth daily with breakfast. 90 tablet 0   metoprolol succinate (TOPROL-XL) 100 MG 24 hr tablet Take 1 tablet (100 mg total) by mouth daily. Take with or immediately following a meal. 90 tablet 3   Multiple Vitamin (MULTIVITAMIN) tablet Take 1 tablet by mouth daily.     Olmesartan-amLODIPine-HCTZ (TRIBENZOR) 40-5-25 MG TABS Take 1 tablet by mouth daily. 90 tablet 3   oxybutynin (DITROPAN-XL) 10 MG 24 hr tablet TAKE 1 TABLET BY MOUTH DAILY. 30 tablet 3    rosuvastatin (CRESTOR) 10 MG tablet Take 0.5 tablets (5 mg total) by mouth daily. 90 tablet 1   Semaglutide, 1 MG/DOSE, 4 MG/3ML SOPN Inject 1 mg as directed once a week. 6 mL 1   SYRINGE-NEEDLE, DISP, 3 ML 21G X 1-1/2" 3 ML MISC Use to inject testosterone every week 50 each 1   testosterone cypionate (DEPO-TESTOSTERONE) 200 MG/ML injection Inject 0.5 mLs (100 mg total) into the muscle every 14 (fourteen) days. 10 mL 0   Vitamin D, Ergocalciferol, (DRISDOL) 1.25 MG (50000 UNIT) CAPS  capsule Take 1 capsule (50,000 Units total) by mouth every 3 (three) days. 12 capsule 0   No current facility-administered medications for this visit.    ALLERGIES:  No Known Allergies  PHYSICAL EXAM:  Performance status (ECOG): 1 - Symptomatic but completely ambulatory  Vitals:   08/07/23 1304  BP: (!) 183/125  Pulse: (!) 118  Temp: 98.4 F (36.9 C)  SpO2: 98%    Wt Readings from Last 3 Encounters:  08/07/23 (!) 416 lb 14.4 oz (189.1 kg)  07/21/23 (!) 416 lb (188.7 kg)  07/16/23 (!) 410 lb (186 kg)   Physical Exam Constitutional:      Appearance: Normal appearance.  Cardiovascular:     Rate and Rhythm: Normal rate and regular rhythm.  Pulmonary:     Effort: Pulmonary effort is normal.     Breath sounds: Normal breath sounds.  Abdominal:     General: Bowel sounds are normal.     Palpations: Abdomen is soft.  Musculoskeletal:        General: No swelling. Normal range of motion.  Neurological:     Mental Status: He is alert and oriented to person, place, and time. Mental status is at baseline.     LABORATORY DATA:  I have reviewed the labs as listed.     Latest Ref Rng & Units 08/01/2023   10:24 AM 07/09/2023   11:07 AM 06/05/2023    1:46 PM  CBC  WBC 4.0 - 10.5 K/uL 16.4  17.7  13.7   Hemoglobin 13.0 - 17.0 g/dL 16.1  09.6  04.5   Hematocrit 39.0 - 52.0 % 42.1  42.7  43.6   Platelets 150 - 400 K/uL 400  455  380       Latest Ref Rng & Units 07/09/2023   11:08 AM 11/19/2022   10:19 AM  09/03/2022    8:50 AM  CMP  Glucose 70 - 99 mg/dL 70 - 99 mg/dL 409    811  914  782   BUN 6 - 20 mg/dL 6 - 20 mg/dL 9    10  19  13    Creatinine 0.76 - 1.27 mg/dL 9.56 - 2.13 mg/dL 0.86    5.78  4.69  6.29   Sodium 134 - 144 mmol/L 134 - 144 mmol/L 140    139  139  140   Potassium 3.5 - 5.2 mmol/L 3.5 - 5.2 mmol/L 3.7    3.7  3.6  3.5   Chloride 96 - 106 mmol/L 96 - 106 mmol/L 101    99  96  102   CO2 20 - 29 mmol/L 20 - 29 mmol/L 24    21  25  27    Calcium 8.7 - 10.2 mg/dL 8.7 - 52.8 mg/dL 8.7    8.8  9.1  8.8   Total Protein 6.0 - 8.5 g/dL 7.2  7.0  7.1   Total Bilirubin 0.0 - 1.2 mg/dL 0.4  0.3  0.5   Alkaline Phos 44 - 121 IU/L 103  93    AST 0 - 40 IU/L 20  17  19    ALT 0 - 44 IU/L 24  23  22        Component Value Date/Time   RBC 5.05 08/01/2023 1024   MCV 83.4 08/01/2023 1024   MCV 83 07/09/2023 1107   MCH 27.1 08/01/2023 1024   MCHC 32.5 08/01/2023 1024   RDW 13.9 08/01/2023 1024   RDW 14.0 07/09/2023 1107   LYMPHSABS 4.5 (  H) 08/01/2023 1024   LYMPHSABS 4.2 (H) 07/09/2023 1107   MONOABS 0.8 08/01/2023 1024   EOSABS 0.5 08/01/2023 1024   EOSABS 0.5 (H) 07/09/2023 1107   BASOSABS 0.1 08/01/2023 1024   BASOSABS 0.1 07/09/2023 1107    DIAGNOSTIC IMAGING:  I have independently reviewed the scans and discussed with the patient. No results found.   ASSESSMENT:  1.  Neutrophilic leukocytosis: -Previously tested for BCR/ABL and Jak 2 V6 56F mutation and was negative. -He is a non-smoker. -Denies any skin infections, dental infections.  Denies any steroid use.  Denies any history of splenectomy. -Denies any fevers, night sweats or weight loss. -Ultrasound of the abdomen on 03/24/2017 showed normal-sized spleen.  Hepatic steatosis.   2.  Thrombocytosis: -He has history of mildly elevated platelet count for many years.   PLAN:  1.  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 - No infections in the last 1  year.  - Reviewed labs from 08/01/2023 shows a white blood cell count of 16.4 (17.7), hemoglobin 13.7 with a normal platelet count.  ANC is 10.5 (12.0) with an elevated absolute lymphocyte count.  LDH is normal. -Slight decrease in white blood cell count from previous.  Recommend continuing to monitor at this time.  Will hold off on bone marrow unless significant deviation from previous labs. -Recommend follow-up in 6 months with virtual visit.  2.  Thrombocytosis: - Platelet count has been intermittently elevated since 2018. -Platelet has ranged from normal to as high as 467.  Most recent platelet count is 400 -Continue to monitor.  3.  Intentional weight loss: - Previously on Ozempic no longer afford it. - He was able to lose between 30 and 40 pounds.  He continues to increase activity and make healthy eating decisions.  PLAN SUMMARY: >> Return to clinic in 6 months with labs a few days before and virtual visit.     I spent 25 minutes dedicated to the care of this patient (face-to-face and non-face-to-face) on the date of the encounter to include what is described in the assessment and plan.  Orders placed this encounter:  Orders Placed This Encounter  Procedures   CBC with Differential   Lactate dehydrogenase   Durenda Hurt, NP 08/07/2023 2:52 PM

## 2023-08-11 ENCOUNTER — Telehealth (INDEPENDENT_AMBULATORY_CARE_PROVIDER_SITE_OTHER): Payer: MEDICAID | Admitting: Psychology

## 2023-08-13 ENCOUNTER — Encounter (INDEPENDENT_AMBULATORY_CARE_PROVIDER_SITE_OTHER): Payer: Self-pay | Admitting: Adult Health

## 2023-08-13 ENCOUNTER — Ambulatory Visit (INDEPENDENT_AMBULATORY_CARE_PROVIDER_SITE_OTHER): Payer: MEDICAID | Admitting: Adult Health

## 2023-08-13 VITALS — BP 129/88 | HR 109 | Temp 97.8°F | Ht 70.0 in | Wt >= 6400 oz

## 2023-08-13 DIAGNOSIS — E1159 Type 2 diabetes mellitus with other circulatory complications: Secondary | ICD-10-CM

## 2023-08-13 DIAGNOSIS — Z7985 Long-term (current) use of injectable non-insulin antidiabetic drugs: Secondary | ICD-10-CM

## 2023-08-13 DIAGNOSIS — E1169 Type 2 diabetes mellitus with other specified complication: Secondary | ICD-10-CM

## 2023-08-13 DIAGNOSIS — I152 Hypertension secondary to endocrine disorders: Secondary | ICD-10-CM | POA: Diagnosis not present

## 2023-08-13 DIAGNOSIS — Z6841 Body Mass Index (BMI) 40.0 and over, adult: Secondary | ICD-10-CM

## 2023-08-13 DIAGNOSIS — F4321 Adjustment disorder with depressed mood: Secondary | ICD-10-CM | POA: Diagnosis not present

## 2023-08-13 DIAGNOSIS — E669 Obesity, unspecified: Secondary | ICD-10-CM

## 2023-08-13 DIAGNOSIS — E559 Vitamin D deficiency, unspecified: Secondary | ICD-10-CM

## 2023-08-13 DIAGNOSIS — Z7984 Long term (current) use of oral hypoglycemic drugs: Secondary | ICD-10-CM

## 2023-08-13 MED ORDER — VITAMIN D (ERGOCALCIFEROL) 1.25 MG (50000 UNIT) PO CAPS
50000.0000 [IU] | ORAL_CAPSULE | ORAL | 0 refills | Status: DC
Start: 2023-08-13 — End: 2023-10-13

## 2023-08-13 NOTE — Progress Notes (Signed)
 WEIGHT SUMMARY AND BIOMETRICS  Vitals Temp: 97.8 F (36.6 C) BP: 129/88 Pulse Rate: (!) 109 SpO2: 97 %   Anthropometric Measurements Height: 5\' 10"  (1.778 m) Weight: (!) 411 lb (186.4 kg) BMI (Calculated): 58.97 Weight at Last Visit: 410lb Weight Lost Since Last Visit: 0 Weight Gained Since Last Visit: 1lb Starting Weight: 441lb Total Weight Loss (lbs): 30 lb (13.6 kg)   Body Composition  Body Fat %: 46.1 % Fat Mass (lbs): 189.8 lbs Muscle Mass (lbs): 211 lbs Total Body Water (lbs): 156.6 lbs Visceral Fat Rating : 35   Other Clinical Data Fasting: no Labs: no Today's Visit #: 10 Starting Date: 11/19/22    Chief Complaint:   OBESITY Allen Harding is here to discuss his progress with his obesity treatment plan.  He is on the the Category 4 Plan and states he is following his eating plan approximately 50 % of the time.  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   Interim History:  07/21/2023 Endocrinology OV/Dr. Fransico Harding Endo take over antidiabetic medications He increased Ozempic 0.5mg  to 1mg  He had his first dose on 08/08/2023 Denies mass in neck, dysphagia, dyspepsia, persistent hoarseness, abdominal pain, or N/V/C   Hunger/appetite-he will not become hungry until dinner time  08/07/2023 Oncology OV, [id number] leukocytosis and thrombocytosis   Stress- recent death of her maternal grandfather and dealing with estate  Hydration-he does not drink any plain water. He prefers to drink PowerAde- 2 x 28 oz/day  Reviewed Bioimpedance results with pt: Muscle Mass: +1.4 lbs Adipose Mass: -0.8 lb  Subjective:   1. Hypertension associated with type 2 diabetes mellitus (HCC) BP MUCH IMPROVED at OV He has been taking all antihypertensive therapy as directed He denies HA, palpitations, CP  2. Type 2 diabetes mellitus with morbid obesity (HCC) Lab Results  Component Value Date   HGBA1C 6.7 (H) 07/09/2023   HGBA1C 7.6 (H) 11/19/2022   HGBA1C 7.6 (H)  09/03/2022    He is NOT checking home CBG He denies sx's of hypogylcemia 07/21/2023 Endocrinology OV/Dr. Fransico Harding Endo take over antidiabetic medications He increased Ozempic 0.5mg  to 1mg  Continued on daily Metformin 500mg   He had his first dose Ozempiv 1mg  on 08/08/2023 Denies mass in neck, dysphagia, dyspepsia, persistent hoarseness, abdominal pain, or N/V/C   3. Grief They have moved in with his great aunt. His great aunt works 3 jobs. He endorses stress with his a few family members over his grandfathers estate. He and his mother are repairing the grandfathers house in hopes of selling property.  4. Vitamin D deficiency  Latest Reference Range & Units 09/03/22 08:50 11/19/22 10:19 07/09/23 11:07 07/09/23 11:08  Vitamin D, 25-Hydroxy 30.0 - 100.0 ng/mL 26 (L) 31.1 18.8 (L) 21.1 (L)  (L): Data is abnormally low  He is on bi-weekly Ergocalciferol- denies N/V/Muscle Weakness  Assessment/Plan:   1. Hypertension associated with type 2 diabetes mellitus (HCC) (Primary) GREAT IMPROVEMENT ON BP Continue to take ALL antihypertensives as directed Limit Na+ Increase plain water intake   2. Type 2 diabetes mellitus with morbid obesity (HCC) Continue daily Metformin 500mg  and weekly Ozempic 1mg  F/u with Endo as directed Limit sugar/simple CHO intake Increase lean protein  3. Grief Surround self with positive friends and family members  4. Vitamin D deficiency Refill Vitamin D, Ergocalciferol, (DRISDOL) 1.25 MG (50000 UNIT) CAPS capsule Take 1 capsule (50,000 Units total) by mouth every 3 (three) days. Dispense: 12 capsule, Refills: 0 ordered   5. BMI 60.0-69.9, adult (  HCC)- current BMI 58.97  Allen Harding is currently in the action stage of change. As such, his goal is to continue with weight loss efforts. He has agreed to the Category 4 Plan.   Exercise goals: NO EXERCISE RECOMMENDED AT THIS TIME  Behavioral modification strategies: increasing lean protein intake, decreasing simple  carbohydrates, increasing vegetables, increasing water intake, no skipping meals, meal planning and cooking strategies, keeping healthy foods in the home, ways to avoid boredom eating, and planning for success.  [name] has agreed to follow-up with our clinic in 4 weeks. He was informed of the importance of frequent follow-up visits to maximize his success with intensive lifestyle modifications for his multiple health conditions.   Objective:   Blood pressure 129/88, pulse (!) 109, temperature 97.8 F (36.6 C), height 5\' 10"  (1.778 m), weight (!) 411 lb (186.4 kg), SpO2 97%. Body mass index is 58.97 kg/m.  General: Cooperative, alert, well developed, in no acute distress. HEENT: Conjunctivae and lids unremarkable. Cardiovascular: Regular rhythm.  Lungs: Normal work of breathing. Neurologic: No focal deficits.   Lab Results  Component Value Date   CREATININE 0.74 (L) 07/09/2023   CREATININE 0.75 (L) 07/09/2023   BUN 9 07/09/2023   BUN 10 07/09/2023   NA 140 07/09/2023   NA 139 07/09/2023   K 3.7 07/09/2023   K 3.7 07/09/2023   CL 101 07/09/2023   CL 99 07/09/2023   CO2 24 07/09/2023   CO2 21 07/09/2023   Lab Results  Component Value Date   ALT 24 07/09/2023   AST 20 07/09/2023   ALKPHOS 103 07/09/2023   BILITOT 0.4 07/09/2023   Lab Results  Component Value Date   HGBA1C 6.7 (H) 07/09/2023   HGBA1C 7.6 (H) 11/19/2022   HGBA1C 7.6 (H) 09/03/2022   HGBA1C 6.1 01/23/2021   HGBA1C 6.3 09/04/2020   Lab Results  Component Value Date   INSULIN 46.9 (H) 07/09/2023   INSULIN 43.8 (H) 11/19/2022   Lab Results  Component Value Date   TSH 3.580 11/19/2022   Lab Results  Component Value Date   CHOL 196 07/09/2023   HDL 44 07/09/2023   LDLCALC 130 (H) 07/09/2023   TRIG 122 07/09/2023   CHOLHDL 4.5 07/09/2023   Lab Results  Component Value Date   VD25OH 21.1 (L) 07/09/2023   VD25OH 18.8 (L) 07/09/2023   VD25OH 31.1 11/19/2022   Lab Results  Component Value Date    WBC 16.4 (H) 08/01/2023   HGB 13.7 08/01/2023   HCT 42.1 08/01/2023   MCV 83.4 08/01/2023   PLT 400 08/01/2023   No results found for: "IRON", "TIBC", "FERRITIN"  Attestation Statements:   Reviewed by clinician on day of visit: allergies, medications, problem list, medical history, surgical history, family history, social history, and previous encounter notes.  I have reviewed the above documentation for accuracy and completeness, and I agree with the above. -  Melodee Lupe d. Nakhi Choi, NP-C

## 2023-08-25 ENCOUNTER — Telehealth (INDEPENDENT_AMBULATORY_CARE_PROVIDER_SITE_OTHER): Payer: MEDICAID | Admitting: Psychology

## 2023-08-25 DIAGNOSIS — F5089 Other specified eating disorder: Secondary | ICD-10-CM

## 2023-08-25 DIAGNOSIS — F909 Attention-deficit hyperactivity disorder, unspecified type: Secondary | ICD-10-CM

## 2023-08-25 NOTE — Progress Notes (Signed)
  Office: [phone]  /  Fax: [phone]    Date: August 25, 2023  Appointment Start Time: 3:45pm Duration: 23 minutes Provider: Lawerance Cruel, Psy.D. Type of Session: Individual Therapy  Location of Patient:  Aunt's Home  (address obtained; private location) Location of Provider: Provider's Home (private office) Type of Contact: Telepsychological Visit via MyChart Video Visit  Session Content: Allen Harding is a 28 y.o. male presenting for a follow-up appointment to address the previously established treatment goal of increasing coping skills.Today's appointment was a telepsychological visit. Allen Harding provided verbal consent for today's telepsychological appointment and he is aware he is responsible for securing confidentiality on his end of the session. Prior to proceeding with today's appointment, Allen Harding's physical location at the time of this appointment was obtained as well a phone number he could be reached at in the event of technical difficulties. Nature and this provider participated in today's telepsychological service.   This provider conducted a brief check-in. Allen Harding described his eating habits as "good." Reviewed the previously developed plan to cope with emotional eating behaviors. Allen Harding reported a significant reduction in emotional eating behaviors. Psychoeducation provided regarding mindful eating strategies (sit down, slowly chew, savor, and simplify). Furthermore, termination planning was discussed. Allen Harding was receptive to a follow-up appointment in 3-4 weeks and an additional follow-up/termination appointment in 4-5 weeks after that. Overall, Allen Harding was receptive to today's appointment as evidenced by openness to sharing, responsiveness to feedback, and willingness to implement discussed strategies .  Mental Status Examination:  Appearance: neat Behavior: appropriate to circumstances Mood: neutral Affect: mood congruent Speech: WNL Eye Contact: appropriate Psychomotor Activity:  WNL Gait: unable to assess Thought Process: linear, logical, and goal directed and no evidence or endorsement of suicidal, homicidal, and self-harm ideation, plan and intent  Thought Content/Perception: no hallucinations, delusions, bizarre thinking or behavior endorsed or observed Orientation: AAOx4 Memory/Concentration: intact Insight: good Judgment: good  Interventions:  Conducted a brief chart review Provided empathic reflections and validation Reviewed content from the previous session Provided positive reinforcement Employed supportive psychotherapy interventions to facilitate reduced distress and to improve coping skills with identified stressors Psychoeducation provided regarding mindful eating strategies  DSM-5 Diagnosis(es):  F50.89 Other Specified Feeding or Eating Disorder, Emotional Eating Behaviors and F90.9 Unspecified Attention-Deficit/Hyperactivity Disorder   Treatment Goal & Progress: During the initial appointment with this provider, the following treatment goal was established: increase coping skills. Allen Harding has demonstrated progress in his goal as evidenced by increased awareness of hunger patterns, increased awareness of triggers for emotional eating behaviors, and reduction in emotional eating behaviors . Allen Harding also continues to demonstrate willingness to engage in learned skill(s).  Plan: The next appointment is scheduled for 09/22/2023 at 2pm, which will be via MyChart Video Visit. The next session will focus on working towards the established treatment goal. Allen Harding will continue with his primary therapist.    Lawerance Cruel, PsyD

## 2023-08-27 ENCOUNTER — Other Ambulatory Visit: Payer: MEDICAID

## 2023-08-27 DIAGNOSIS — I152 Hypertension secondary to endocrine disorders: Secondary | ICD-10-CM

## 2023-08-27 DIAGNOSIS — E1169 Type 2 diabetes mellitus with other specified complication: Secondary | ICD-10-CM

## 2023-09-01 ENCOUNTER — Encounter: Payer: MEDICAID | Admitting: Family Medicine

## 2023-09-15 ENCOUNTER — Ambulatory Visit (INDEPENDENT_AMBULATORY_CARE_PROVIDER_SITE_OTHER): Payer: MEDICAID | Admitting: Family Medicine

## 2023-09-22 ENCOUNTER — Telehealth (INDEPENDENT_AMBULATORY_CARE_PROVIDER_SITE_OTHER): Payer: MEDICAID | Admitting: Psychology

## 2023-09-22 DIAGNOSIS — F5089 Other specified eating disorder: Secondary | ICD-10-CM | POA: Diagnosis not present

## 2023-09-22 DIAGNOSIS — F909 Attention-deficit hyperactivity disorder, unspecified type: Secondary | ICD-10-CM | POA: Diagnosis not present

## 2023-09-22 NOTE — Progress Notes (Signed)
  Office: [phone]  /  Fax: [phone]    Date: September 22, 2023  Appointment Start Time: 2:01pm Duration: 18 minutes Provider: Catherene Close, Psy.D. Type of Session: Individual Therapy  Location of Patient: [facility]  (address obtained; private location)  Location of Provider: [facility]'s Home (private office) Type of Contact: Telepsychological Visit via MyChart Video Visit  Session Content: Allen Harding is a 28 y.o. male presenting for a follow-up appointment to address the previously established treatment goal of increasing coping skills.Today's appointment was a telepsychological visit. Allen Harding provided verbal consent for today's telepsychological appointment and he is aware he is responsible for securing confidentiality on his end of the session. Prior to proceeding with today's appointment, Allen Harding's physical location at the time of this appointment was obtained as well a phone number he could be reached at in the event of technical difficulties. Allen Harding and this provider participated in today's telepsychological service.   This provider conducted a brief check-in. Allen Harding discussed experiencing stress at home, noting he has discussed with his primary therapist. He described his eating habits as "good." However, he acknowledged he does not always eat breakfast or lunch "unless it is prepared." Further explored and processed. Psychoeducation provided regarding the importance of eating regularly. Engaged Allen Harding in problem solving (e.g., make hard boiled eggs in bulk, purchase frozen meals congruent to structured meal plan). Overall, Allen Harding was receptive to today's appointment as evidenced by openness to sharing, responsiveness to feedback, and willingness to implement discussed strategies .  Mental Status Examination:  Appearance: neat Behavior: appropriate to circumstances Mood: neutral Affect: mood congruent Speech: WNL Eye Contact: intermittent Psychomotor Activity: WNL Gait: unable to  assess Thought Process: linear, logical, and goal directed and no evidence or endorsement of suicidal, homicidal, and self-harm ideation, plan and intent  Thought Content/Perception: no hallucinations, delusions, bizarre thinking or behavior endorsed or observed Orientation: AAOx4 Memory/Concentration: intact Insight: good Judgment: good  Interventions:  Conducted a brief chart review Provided empathic reflections and validation Employed supportive psychotherapy interventions to facilitate reduced distress and to improve coping skills with identified stressors Engaged patient in problem solving  DSM-5 Diagnosis(es):  F50.89 Other Specified Feeding or Eating Disorder, Emotional Eating Behaviors and F90.9 Unspecified Attention-Deficit/Hyperactivity Disorder   Treatment Goal & Progress: During the initial appointment with this provider, the following treatment goal was established: increase coping skills. Allen Harding has demonstrated progress in his goal as evidenced by increased awareness of hunger patterns, increased awareness of triggers for emotional eating behaviors, and reduction in emotional eating behaviors. Allen Harding also continues to demonstrate willingness to engage in learned skill(s).   Plan: The next appointment is scheduled for 10/07/2023 at 11:30am, which will be via MyChart Video Visit. The next session will focus on working towards the established treatment goal and termination. Allen Harding will continue with his primary therapist.    Catherene Close, PsyD

## 2023-09-23 ENCOUNTER — Ambulatory Visit: Payer: MEDICAID | Attending: Cardiology | Admitting: Pharmacist

## 2023-09-23 ENCOUNTER — Encounter: Payer: Self-pay | Admitting: Pharmacist

## 2023-09-23 VITALS — BP 160/94 | HR 101

## 2023-09-23 DIAGNOSIS — I1A0 Resistant hypertension: Secondary | ICD-10-CM | POA: Diagnosis not present

## 2023-09-23 MED ORDER — OLMESARTAN-AMLODIPINE-HCTZ 40-10-25 MG PO TABS: 1.0000 | ORAL_TABLET | Freq: Every morning | ORAL | 5 refills | Status: AC

## 2023-09-23 NOTE — Patient Instructions (Addendum)
 Nice meeting you two today  We would like your blood pressure to be less than 130/80  Please continue your metoprolol  100mg  once a day  We will increase your dose of your amlodipine /hydrochlorothiazide/olmesartan    Try to increase your water intake and consider switching to sugar free powerade  Try to start checking your blood pressures at home about an hour or two after your morning medications. Please bring your cuff with you to your next appointment.  Let us  know if you have any questions  Joelene Murrain, PharmD, BCACP, CDCES, CPP [address], [address] [place], [place], [address] Phone: [phone], Fax: [phone]

## 2023-09-23 NOTE — Progress Notes (Signed)
 Patient ID: LAWERNCE EARLL                 DOB: 11/21/1995                      MRN: [id number]     HPI: Allen Harding is a 28 y.o. male referred by Dr. Theodis Fiscal to HTN clinic. PMH is significant for HTN, T2DM, hypogonadism, obesity, HLD, autism and ADHD.  Patient presents today with sister [place]. Lives with mother who manages his medications. Has a BP ciff at home but has not checked home BP. Has also not been monitoring BG.  Currently on Tribenzor and Toprol  100mg  daily. Reports no adverse effects. Reports compliance but is interested in prepackaged meds.   Currently on high protein diet as prescribed by [facility]. His mother cooks his meals. He is not sure if she adds salt to foods. Eats a lot of steaks and pork chops. Reports he is able to be physically active but has not been exercising. Currently on Ozempic  1mg  weekly.  Was not drinking much water until he bought flavorings. Continues to prefer sports drinks.  BP was controlled at last visit with [facility].   Current HTN meds:  Toprol  100mg  daily Olemsartan/amlodipine /hydrochlorothiazide 40/5/25  BP goal: <130/80   Wt Readings from Last 3 Encounters:  08/13/23 (!) 411 lb (186.4 kg)  08/07/23 (!) 416 lb 14.4 oz (189.1 kg)  07/21/23 (!) 416 lb (188.7 kg)   BP Readings from Last 3 Encounters:  08/13/23 129/88  08/07/23 (!) 183/125  07/21/23 (!) 144/98   Pulse Readings from Last 3 Encounters:  08/13/23 (!) 109  08/07/23 (!) 118  07/21/23 92    Renal function: CrCl cannot be calculated (Patient's most recent lab result is older than the maximum 21 days allowed.).  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 DX: E11.65 200 each 5   Blood Glucose Monitoring Suppl (ACCU-CHEK GUIDE) w/Device KIT Use as directed to check glucose daily. DX: E11.65 1 kit 1   Blood Pressure Monitoring KIT 1 Units by Does not apply route daily. 1 kit 0   glucose blood (ACCU-CHEK GUIDE) test strip Use as directed to check glucose daily. DX: E11.65 200 each 5   metFORMIN  (GLUCOPHAGE ) 500 MG tablet Take 1 tablet (500 mg total) by mouth daily with breakfast. 90 tablet 0   metoprolol  succinate (TOPROL -XL) 100 MG 24 hr tablet Take 1 tablet (100 mg total) by mouth daily. Take with or immediately following a meal. 90 tablet 3   Multiple Vitamin (MULTIVITAMIN) tablet Take 1 tablet by mouth daily.     Olmesartan -amLODIPine -HCTZ (TRIBENZOR) 40-5-25 MG TABS Take 1 tablet by mouth daily. 90 tablet 3   oxybutynin  (DITROPAN -XL) 10 MG 24 hr tablet TAKE 1 TABLET BY MOUTH DAILY. 30 tablet 3  rosuvastatin  (CRESTOR ) 10 MG tablet Take 0.5 tablets (5 mg total) by mouth daily. 90 tablet 1   Semaglutide , 1 MG/DOSE, 4 MG/3ML SOPN Inject 1 mg as directed once a week. 6 mL 1   SYRINGE-NEEDLE, DISP, 3 ML 21G X 1-1/2" 3 ML MISC Use to inject testosterone  every week 50 each 1   testosterone  cypionate (DEPO-TESTOSTERONE ) 200 MG/ML injection Inject 0.5 mLs (100 mg total) into the muscle every 14 (fourteen) days. 10 mL 0   Vitamin D , Ergocalciferol , (DRISDOL ) 1.25 MG (50000 UNIT) CAPS capsule Take 1 capsule (50,000 Units total) by mouth every 3 (three) days. 12 capsule 0   No current facility-administered medications on file prior to visit.    No Known Allergies   Assessment/Plan:  1. Hypertension -  Patient's BP in room today 160/94 which is above goal of <130/80. Will increase Tribenzor to max  dosage.  Recommended he begin checking his BP at home more regularly. Recommended continuing to drink water and decrease drinking sports drinks due to high sodium and sugar content. Increase physical activity.  Patient expressed interest at end of appt in pill packs. Will reach out to community pharmacy team. Recheck in 4 weeks.  Continue Metoprolol  succinate 100mg  daily Increase Tribenzor to amlodipine /hydrochlorothiazide/olmesartan  03/28/39 Recheck in 4 weeks  Chris Gricel Copen, PharmD, BCACP, CDCES, CPP [address], [address] [place], [place], [address] Phone: [phone], Fax: [phone]

## 2023-09-28 ENCOUNTER — Other Ambulatory Visit: Payer: Self-pay | Admitting: "Endocrinology

## 2023-10-07 ENCOUNTER — Telehealth (INDEPENDENT_AMBULATORY_CARE_PROVIDER_SITE_OTHER): Payer: MEDICAID | Admitting: Psychology

## 2023-10-07 DIAGNOSIS — F909 Attention-deficit hyperactivity disorder, unspecified type: Secondary | ICD-10-CM | POA: Diagnosis not present

## 2023-10-07 DIAGNOSIS — F5089 Other specified eating disorder: Secondary | ICD-10-CM

## 2023-10-07 NOTE — Progress Notes (Signed)
  Office: [phone]  /  Fax: [phone]    Date: Oct 07, 2023  Appointment Start Time: 11:32am Duration: 29 minutes Provider: Catherene Close, Psy.D. Type of Session: Individual Therapy  Location of Patient: [facility] (address obtained; private location)  Location of Provider: [facility] Home (private office) Type of Contact: Telepsychological Visit via MyChart Video Visit  Session Content: Allen Harding is a 28 y.o. male presenting for a follow-up appointment to address the previously established treatment goal of increasing coping skills.Today's appointment was a telepsychological visit. Allen Harding provided verbal consent for today's telepsychological appointment and he is aware he is responsible for securing confidentiality on his end of the session. Prior to proceeding with today's appointment, Allen Harding physical location at the time of this appointment was obtained as well a phone number he could be reached at in the event of technical difficulties. Sylvia and this provider participated in today's telepsychological service.   This provider conducted a brief check-in. Allen Harding shared about recent events, noting a belief he is continuing to lose weight. He also shared people have commented on him "looking a bit slimmer." Allen Harding stated an improvement in emotional eating behaviors; however, he acknowledged, "It's still there." Notably, he discussed wanting more for his life, adding sometimes he feels down and he will cope by watching YouTube or Netflix. Briefly explored and processed. Additionally, Allen Harding disclosed having "intrusive thoughts" about hurting his aunt that he lives with as she has reportedly "been ganging up" on his mother since his grandfather's passing. Further explored and risk assessment completed. Allen Harding clarified having thoughts about hitting his aunt, adding he has never experienced plan and intent nor has he physically harmed her. He also denied experiencing homicidal ideation, plan, and intent.  Given the abovementioned stressors, he described experiencing decreased mood and urges to engage in emotional eating behaviors. Overall, Allen Harding was receptive to today's appointment as evidenced by openness to sharing, responsiveness to feedback, and willingness to continue engaging in learned skills.  Mental Status Examination:  Appearance: neat Behavior: appropriate to circumstances Mood: sad Affect: mood congruent Speech: WNL Eye Contact: intermittent Psychomotor Activity: WNL Gait: unable to assess Thought Process: linear, logical, and goal directed and denies suicidal, homicidal, and self-harm ideation, plan and intent  Thought Content/Perception: no hallucinations, delusions, bizarre thinking or behavior endorsed or observed Orientation: AAOx4 Memory/Concentration: intact Insight: good Judgment: good  Interventions:  Conducted a brief chart review Provided empathic reflections and validation Employed supportive psychotherapy interventions to facilitate reduced distress and to improve coping skills with identified stressors Conducted a risk assessment   DSM-5 Diagnosis(es):  F50.89 Other Specified Feeding or Eating Disorder, Emotional Eating Behaviors and F90.9 Unspecified Attention-Deficit/Hyperactivity Disorder   Treatment Goal & Progress: During the initial appointment with this provider, the following treatment goal was established: increase coping skills. Allen Harding demonstrated progress in his goal as evidenced by increased awareness of hunger patterns, increased awareness of triggers for emotional eating behaviors, and reduction in emotional eating behaviors. Allen Harding also continues to demonstrate willingness to engage in learned skill(s).   Plan: This provider recommended an additional follow-up given recent urge to engage in emotional eating behaviors. The next appointment is scheduled for 11/04/2023 at 11:30am, which will be via MyChart Video Visit. The next session will focus on  working towards the established treatment goal. Allen Harding will continue with his primary therapist and will try to increase frequency of appointments to weekly to address current intrusive thoughts and decreased mood.    Catherene Close, PsyD

## 2023-10-13 ENCOUNTER — Ambulatory Visit (INDEPENDENT_AMBULATORY_CARE_PROVIDER_SITE_OTHER): Payer: MEDICAID | Admitting: Adult Health

## 2023-10-13 ENCOUNTER — Encounter (INDEPENDENT_AMBULATORY_CARE_PROVIDER_SITE_OTHER): Payer: Self-pay | Admitting: Adult Health

## 2023-10-13 DIAGNOSIS — E1159 Type 2 diabetes mellitus with other circulatory complications: Secondary | ICD-10-CM | POA: Diagnosis not present

## 2023-10-13 DIAGNOSIS — E669 Obesity, unspecified: Secondary | ICD-10-CM | POA: Diagnosis not present

## 2023-10-13 DIAGNOSIS — I152 Hypertension secondary to endocrine disorders: Secondary | ICD-10-CM | POA: Diagnosis not present

## 2023-10-13 DIAGNOSIS — E559 Vitamin D deficiency, unspecified: Secondary | ICD-10-CM | POA: Diagnosis not present

## 2023-10-13 DIAGNOSIS — Z7985 Long-term (current) use of injectable non-insulin antidiabetic drugs: Secondary | ICD-10-CM

## 2023-10-13 DIAGNOSIS — Z7984 Long term (current) use of oral hypoglycemic drugs: Secondary | ICD-10-CM

## 2023-10-13 DIAGNOSIS — R454 Irritability and anger: Secondary | ICD-10-CM

## 2023-10-13 DIAGNOSIS — E1169 Type 2 diabetes mellitus with other specified complication: Secondary | ICD-10-CM

## 2023-10-13 DIAGNOSIS — Z6841 Body Mass Index (BMI) 40.0 and over, adult: Secondary | ICD-10-CM

## 2023-10-13 MED ORDER — VITAMIN D (ERGOCALCIFEROL) 1.25 MG (50000 UNIT) PO CAPS: 50000.0000 [IU] | ORAL_CAPSULE | ORAL | 0 refills | Status: DC

## 2023-10-13 NOTE — Progress Notes (Addendum)
 WEIGHT SUMMARY AND BIOMETRICS  Vitals Temp: 98.3 F (36.8 C) BP: (!) 155/92 Pulse Rate: 77 SpO2: 95 %   Anthropometric Measurements Height: 5\' 10"  (1.778 m) Weight: (!) 414 lb (187.8 kg) BMI (Calculated): 59.4 Weight at Last Visit: 411 lb Weight Lost Since Last Visit: 0 Weight Gained Since Last Visit: 3 lb Starting Weight: 441 lb Total Weight Loss (lbs): 27 lb (12.2 kg)   Body Composition  Body Fat %: 47 % Fat Mass (lbs): 194.8 lbs Muscle Mass (lbs): 209.2 lbs Total Body Water (lbs): 157.8 lbs Visceral Fat Rating : 36   Other Clinical Data Fasting: no Labs: no Today's Visit #: 11 Starting Date: 11/19/22    Chief Complaint:   OBESITY Allen Harding is here to discuss his progress with his obesity treatment plan.  He is on the the Category 4 Plan and states he is following his eating plan approximately 50 % of the time.  He states he is exercising: House Work  Interim History:  BP above goal at OV He denies CP or dyspnea He continue to avoid tobacco/vape use  Stress- family tension with his maternal aunts.  Exercise-NEAT Activities  Of note- Mother Allen Harding) at [facility] during OV She accompanies him to all of his medical appt's  Subjective:   1. Type 2 diabetes mellitus with morbid obesity (HCC) Lab Results  Component Value Date   HGBA1C 6.7 (H) 07/09/2023   HGBA1C 7.6 (H) 11/19/2022   HGBA1C 7.6 (H) 09/03/2022     Latest Reference Range & Units 07/09/23 11:08  Glucose 70 - 99 mg/dL 70 - 99 mg/dL 045 (H) 409 (H)  Hemoglobin A1C 4.8 - 5.6 % 6.7 (H)  Est. average glucose Bld gHb Est-mCnc mg/dL 811  (H): Data is abnormally high  Endo/Dr. Monte Antonio manages daily Metformin  500mg  and weekly Ozempic  1mg  Denies mass in neck, dysphagia, dyspepsia, persistent hoarseness, abdominal pain, or N/V/C  He is not checking home CBG He denies sx's of hypoglycemia  2. Hypertension associated with type 2 diabetes mellitus (HCC) BP above goal Historically UNCONTROLLED He  denies CP or dyspnea at present He denies tobacco/vape use Reviewed importance of taking all antihypertensive therapy as directed.  3. Vitamin D  deficiency  Latest Reference Range & Units 09/03/22 08:50 11/19/22 10:19 07/09/23 11:07 07/09/23 11:08  Vitamin D , 25-Hydroxy 30.0 - 100.0 ng/mL 26 (L) 31.1 18.8 (L) 21.1 (L)  (L): Data is abnormally low  He is on Ergocalciferol  Q3Days He denies N/V/Muscle Weakness  4. Anger 10/07/2023 Dr. Pola Brine Bariatric Psychologist   Allen Harding shared that he is frustrated with certain extended family members since the death of his grandfather. He has recently found healthier means to manage stress, ie: avoiding contact or interactions.  He has changed her mobile phone number and not provided to his maternal aunts that have been causing him stress and anger. He reports a noticeable decrease in overall anxiety levels.  Today he endorses stable mood, vehemently denies SI/HI  Assessment/Plan:   1. Type 2 diabetes mellitus with morbid obesity (HCC) (Primary) Dicussed Ozempic  dose at next OV with Dr. Nida/Endocrinology  2. Hypertension associated with type 2 diabetes mellitus (HCC) Take ALL antihypertensives as directed Limit Na+ Increase daily walking  3. Vitamin D  deficiency Refill Vitamin D , Ergocalciferol , (DRISDOL ) 1.25 MG (50000 UNIT) CAPS capsule Take 1 capsule (50,000 Units total) by mouth every 3 (three) days. Dispense: 12 capsule, Refills: 0 ordered 10/13/2023  4. Anger Increase daily walking  Continue with therapy Surrounded self with  positive family/friends Limit exposure to triggering family members  5. BMI 60.0-69.9, adult (HCC)- current BMI 59.5  Allen Harding is currently in the action stage of change. As such, his goal is to continue with weight loss efforts. He has agreed to the Category 4 Plan.   Exercise goals: All adults should avoid inactivity. Some physical activity is better than none, and adults who participate in any amount  of physical activity gain some health benefits. Adults should also include muscle-strengthening activities that involve all major muscle groups on 2 or more days a week. Increase daily walking.  Okay to weight lift.   Behavioral modification strategies: increasing lean protein intake, decreasing simple carbohydrates, increasing vegetables, increasing water intake, meal planning and cooking strategies, keeping healthy foods in the home, ways to avoid boredom eating, ways to avoid night time snacking, better snacking choices, emotional eating strategies, planning for success, and decreasing junk food.  Allen Harding has agreed to follow-up with our clinic in 4 weeks. He was informed of the importance of frequent follow-up visits to maximize his success with intensive lifestyle modifications for his multiple health conditions.   Check Fasting Labs Summer 2025  Objective:   Blood pressure (!) 155/92, pulse 77, temperature 98.3 F (36.8 C), height 5\' 10"  (1.778 m), weight (!) 414 lb (187.8 kg), SpO2 95%. Body mass index is 59.4 kg/m.  General: Cooperative, alert, well developed, in no acute distress. HEENT: Conjunctivae and lids unremarkable. Cardiovascular: Regular rhythm.  Lungs: Normal work of breathing. Neurologic: No focal deficits.   Lab Results  Component Value Date   CREATININE 0.74 (L) 07/09/2023   CREATININE 0.75 (L) 07/09/2023   BUN 9 07/09/2023   BUN 10 07/09/2023   NA 140 07/09/2023   NA 139 07/09/2023   K 3.7 07/09/2023   K 3.7 07/09/2023   CL 101 07/09/2023   CL 99 07/09/2023   CO2 24 07/09/2023   CO2 21 07/09/2023   Lab Results  Component Value Date   ALT 24 07/09/2023   AST 20 07/09/2023   ALKPHOS 103 07/09/2023   BILITOT 0.4 07/09/2023   Lab Results  Component Value Date   HGBA1C 6.7 (H) 07/09/2023   HGBA1C 7.6 (H) 11/19/2022   HGBA1C 7.6 (H) 09/03/2022   HGBA1C 6.1 01/23/2021   HGBA1C 6.3 09/04/2020   Lab Results  Component Value Date   INSULIN  46.9 (H)  07/09/2023   INSULIN  43.8 (H) 11/19/2022   Lab Results  Component Value Date   TSH 3.580 11/19/2022   Lab Results  Component Value Date   CHOL 196 07/09/2023   HDL 44 07/09/2023   LDLCALC 130 (H) 07/09/2023   TRIG 122 07/09/2023   CHOLHDL 4.5 07/09/2023   Lab Results  Component Value Date   VD25OH 21.1 (L) 07/09/2023   VD25OH 18.8 (L) 07/09/2023   VD25OH 31.1 11/19/2022   Lab Results  Component Value Date   WBC 16.4 (H) 08/01/2023   HGB 13.7 08/01/2023   HCT 42.1 08/01/2023   MCV 83.4 08/01/2023   PLT 400 08/01/2023   No results found for: "IRON", "TIBC", "FERRITIN"  Attestation Statements:   Reviewed by clinician on day of visit: allergies, medications, problem list, medical history, surgical history, family history, social history, and previous encounter notes.  I have reviewed the above documentation for accuracy and completeness, and I agree with the above. -  Jeromiah Ohalloran d. Barkley Kratochvil, NP-C

## 2023-11-04 ENCOUNTER — Telehealth (INDEPENDENT_AMBULATORY_CARE_PROVIDER_SITE_OTHER): Payer: MEDICAID | Admitting: Psychology

## 2023-11-04 DIAGNOSIS — F5089 Other specified eating disorder: Secondary | ICD-10-CM

## 2023-11-04 DIAGNOSIS — F909 Attention-deficit hyperactivity disorder, unspecified type: Secondary | ICD-10-CM | POA: Diagnosis not present

## 2023-11-04 NOTE — Progress Notes (Signed)
  Office: [phone]  /  Fax: [phone]    Date: November 04, 2023  Appointment Start Time: 11:32am Duration: 20 minutes Provider: Catherene Close, Psy.D. Type of Session: Individual Therapy  Location of Patient: [facility] (private location) Location of Provider: [facility] (private office) Type of Contact: Telepsychological Visit via MyChart Video Visit  Session Content: Allen Harding is a 28 y.o. male presenting for a follow-up appointment to address the previously established treatment goal of increasing coping skills. Today's appointment was a telepsychological visit. Allen Harding provided verbal consent for today's telepsychological appointment and he is aware he is responsible for securing confidentiality on his end of the session. Prior to proceeding with today's appointment, Allen Harding's physical location at the time of this appointment was obtained as well a phone number he could be reached at in the event of technical difficulties. Allen Harding and this provider participated in today's telepsychological service.   This provider conducted a brief check-in. Allen Harding shared, "I've been doing better since the last visit." Further explored and processed. He described a reduction in the previously discussed intrusive thoughts and noted an improvement in mood. Additionally, he shared about his appointment with Allen Backer, Allen Harding. It was reportedly recommended he focus on "eating more and get more protein." Recent eating habits were explored. It was reflected he is missing breakfast and lunch regularly. As such, psychoeducation provided regarding habit stacking to help Allen Harding eat more regularly. He was engaged in problem solving to help develop habit stacks. The following were developed: Allen Harding will eat at least one protein based food item noted on the breakfast portion of his prescribed structured meal plan after taking his morning medications and Allen Harding will eat at least one protein based food item noted on the lunch portion  of his prescribed structured meal plan after watching YouTube videos on his phone around noon. Explored possible challenges and barriers. Overall, Allen Harding was receptive to today's appointment as evidenced by openness to sharing, responsiveness to feedback, and willingness to implement discussed strategies .  Mental Status Examination:  Appearance: neat Behavior: appropriate to circumstances Mood: sad Affect: mood congruent Speech: WNL Eye Contact: intermittent Psychomotor Activity: WNL Gait: unable to assess Thought Process: linear, logical, and goal directed and denies suicidal, homicidal, and self-harm ideation, plan and intent  Thought Content/Perception: no hallucinations, delusions, bizarre thinking or behavior endorsed or observed Orientation: AAOx4 Memory/Concentration: intact Insight: fair Judgment: fair  Interventions:  Conducted a brief chart review Conducted a risk assessment Provided empathic reflections and validation Reviewed content from the previous session Provided positive reinforcement Employed supportive psychotherapy interventions to facilitate reduced distress and to improve coping skills with identified stressors Engaged patient in problem solving Psychoeducation provided regarding habit stacking   DSM-5 Diagnosis(es): F50.89 Other Specified Feeding or Eating Disorder, Emotional Eating Behaviors and F90.9 Unspecified Attention-Deficit/Hyperactivity Disorder   Treatment Goal & Progress:  During the initial appointment with this provider, the following treatment goal was established: increase coping skills. Allen Harding demonstrated progress in his goal as evidenced by increased awareness of hunger patterns, increased awareness of triggers for emotional eating behaviors, and reduction in emotional eating behaviors. Allen Harding also continues to demonstrate willingness to engage in learned skill(s).   Plan: An additional appointment was recommended due to recent challenges with  eating. The next appointment is scheduled for 12/01/2023 at 11:30am, which will be via MyChart Video Visit. The next session will focus on working towards the established treatment goal. Allen Harding will continue with his primary therapist.    Catherene Close, PsyD

## 2023-11-07 ENCOUNTER — Ambulatory Visit: Payer: MEDICAID | Admitting: Pharmacist

## 2023-11-19 ENCOUNTER — Ambulatory Visit: Payer: MEDICAID | Admitting: "Endocrinology

## 2023-11-25 ENCOUNTER — Ambulatory Visit (INDEPENDENT_AMBULATORY_CARE_PROVIDER_SITE_OTHER): Payer: MEDICAID | Admitting: Adult Health

## 2023-12-01 ENCOUNTER — Telehealth (INDEPENDENT_AMBULATORY_CARE_PROVIDER_SITE_OTHER): Payer: MEDICAID | Admitting: Psychology

## 2023-12-01 ENCOUNTER — Ambulatory Visit (INDEPENDENT_AMBULATORY_CARE_PROVIDER_SITE_OTHER): Payer: MEDICAID | Admitting: Adult Health

## 2023-12-01 ENCOUNTER — Encounter (INDEPENDENT_AMBULATORY_CARE_PROVIDER_SITE_OTHER): Payer: Self-pay | Admitting: Adult Health

## 2023-12-01 DIAGNOSIS — E559 Vitamin D deficiency, unspecified: Secondary | ICD-10-CM | POA: Diagnosis not present

## 2023-12-01 DIAGNOSIS — E1169 Type 2 diabetes mellitus with other specified complication: Secondary | ICD-10-CM | POA: Diagnosis not present

## 2023-12-01 DIAGNOSIS — Z7984 Long term (current) use of oral hypoglycemic drugs: Secondary | ICD-10-CM

## 2023-12-01 DIAGNOSIS — F909 Attention-deficit hyperactivity disorder, unspecified type: Secondary | ICD-10-CM | POA: Diagnosis not present

## 2023-12-01 DIAGNOSIS — F5089 Other specified eating disorder: Secondary | ICD-10-CM

## 2023-12-01 DIAGNOSIS — Z6841 Body Mass Index (BMI) 40.0 and over, adult: Secondary | ICD-10-CM

## 2023-12-01 DIAGNOSIS — Z7985 Long-term (current) use of injectable non-insulin antidiabetic drugs: Secondary | ICD-10-CM

## 2023-12-01 DIAGNOSIS — I1A0 Resistant hypertension: Secondary | ICD-10-CM | POA: Diagnosis not present

## 2023-12-01 NOTE — Progress Notes (Signed)
 WEIGHT SUMMARY AND BIOMETRICS  Vitals Temp: 98.2 F (36.8 C) BP: (!) 187/126 Pulse Rate: (!) 113 SpO2: 96 %   Anthropometric Measurements Height: 5' 10 (1.778 m) Weight: (!) 418 lb (189.6 kg) BMI (Calculated): 59.98 Weight at Last Visit: 414 lb Weight Lost Since Last Visit: 0 Weight Gained Since Last Visit: 4 lb Starting Weight: 441 lb Total Weight Loss (lbs): 23 lb (10.4 kg)   Body Composition  Body Fat %: 47.2 % Fat Mass (lbs): 197.4 lbs Muscle Mass (lbs): 210.2 lbs Total Body Water (lbs): 158.4 lbs Visceral Fat Rating : 36   Other Clinical Data Fasting: No Labs: No Today's Visit #: 12 Starting Date: 11/19/22    Chief Complaint:   OBESITY Allen Harding is here to discuss his progress with his obesity treatment plan. He is on the the Category 4 Plan and states he is following his eating plan approximately 50 % of the time.  He states he is exercising: None   Interim History:  BP WELL ABOVE GOAL He estimates taking antihypertensives 0-1 times per week- he has a difficult time remembering He denies CP or dyspnea Heart and lungs ausculted- normal findings  He feels that he overate this past sat- experienced N/V He denies current GI upset at present Defers fasting labs until next OV  Stress- he endorses reduced levels of anxiety since distancing himself from problematic family members  Exercise-None  Hydration-Gatorade, Powderade, sugar free soda He will have 1-2 electrolyte drinks/day Reviewed nutrition facts of these electrolyte drinks: Serving of Powerade 140 cal/250mg  Na+ Serving of Gatorade 80 cal/160mg  Na+  Subjective:   1. Resistant hypertension BP uncontrolled He denies CP or dyspnea  He estimates to take antihypertensives 0-1 times per week  LENGTHY discussion with pt and his mother, Allen Harding, about the risks of uncontrolled HTN  He is followed by Cards and PharmD  EPIC review demonstrates SBP: 120-210s DBP: 70-140s  2. Type 2  diabetes mellitus with morbid obesity (HCC) Endo/Dr. Lenis manages daily Metformin  500mg  and weekly Ozempic  1mg   Denies mass in neck, dysphagia, dyspepsia, persistent hoarseness, abdominal pain, or N/V/C  He is not checking home CBG He denies sx's of hypoglycemia  He has chronic f/u with Dr. Lenis next week  3. Vitamin D  deficiency  Latest Reference Range & Units 09/03/22 08:50 11/19/22 10:19 07/09/23 11:07 07/09/23 11:08  Vitamin D , 25-Hydroxy 30.0 - 100.0 ng/mL 26 (L) 31.1 18.8 (L) 21.1 (L)  (L): Data is abnormally low  He is taking ergocalciferol  Q3D He endorses persistent fatigue  Assessment/Plan:   1. Resistant hypertension Set timer on phone to take antihypertensives EVERYDAY Stop drinking Na+ rich drinks, ie Powerade, Gatorade F/u with Cards/PharmD as directed Lengthy discussion of RED FLAG SXs If any develop seek IMMEDIATE medical assistance Pt and pt's mother verbalized understanding and agreement  2. Type 2 diabetes mellitus with morbid obesity (HCC) (Primary) Increas plan compliance to at least 85% Continue current antidiabetic regime per Endo  3. Vitamin D  deficiency Continue bi-weekly Ergocalciferol - denies need for refill today  4. BMI 60.0-69.9, adult (HCC)- current BMI 59.5  Allen Harding is not currently in the action stage of change. As such, his goal is to get back to weightloss efforts . He has agreed to the Category 4 Plan.   Exercise goals: No exercise has been prescribed at this time. Do not EXERCISE UNTIL BP WELL CONTROLED   Behavioral modification strategies: increasing lean protein intake, decreasing simple carbohydrates, increasing vegetables, increasing water intake, decreasing liquid  calories, decreasing sodium intake, increasing high fiber foods, decreasing eating out, no skipping meals, meal planning and cooking strategies, keeping healthy foods in the home, ways to avoid boredom eating, ways to avoid night time snacking, better snacking choices, emotional  eating strategies, planning for success, and decreasing junk food.  Allen Harding has agreed to follow-up with our clinic in 4 weeks. He was informed of the importance of frequent follow-up visits to maximize his success with intensive lifestyle modifications for his multiple health conditions.   Check Fasting Labs at next OV  Objective:   Blood pressure (!) 187/126, pulse (!) 113, temperature 98.2 F (36.8 C), height 5' 10 (1.778 m), weight (!) 418 lb (189.6 kg), SpO2 96%. Body mass index is 59.98 kg/m.  NO ACUTE DISTRESS NOTED  General: Cooperative, alert, well developed, in no acute distress. HEENT: Conjunctivae and lids unremarkable. Cardiovascular: Regular rhythm.  Lungs: Normal work of breathing. Heart and lungs ausculted- normal findings Neurologic: No focal deficits.   Lab Results  Component Value Date   CREATININE 0.74 (L) 07/09/2023   CREATININE 0.75 (L) 07/09/2023   BUN 9 07/09/2023   BUN 10 07/09/2023   NA 140 07/09/2023   NA 139 07/09/2023   K 3.7 07/09/2023   K 3.7 07/09/2023   CL 101 07/09/2023   CL 99 07/09/2023   CO2 24 07/09/2023   CO2 21 07/09/2023   Lab Results  Component Value Date   ALT 24 07/09/2023   AST 20 07/09/2023   ALKPHOS 103 07/09/2023   BILITOT 0.4 07/09/2023   Lab Results  Component Value Date   HGBA1C 6.7 (H) 07/09/2023   HGBA1C 7.6 (H) 11/19/2022   HGBA1C 7.6 (H) 09/03/2022   HGBA1C 6.1 01/23/2021   HGBA1C 6.3 09/04/2020   Lab Results  Component Value Date   INSULIN  46.9 (H) 07/09/2023   INSULIN  43.8 (H) 11/19/2022   Lab Results  Component Value Date   TSH 3.580 11/19/2022   Lab Results  Component Value Date   CHOL 196 07/09/2023   HDL 44 07/09/2023   LDLCALC 130 (H) 07/09/2023   TRIG 122 07/09/2023   CHOLHDL 4.5 07/09/2023   Lab Results  Component Value Date   VD25OH 21.1 (L) 07/09/2023   VD25OH 18.8 (L) 07/09/2023   VD25OH 31.1 11/19/2022   Lab Results  Component Value Date   WBC 16.4 (H) 08/01/2023   HGB 13.7  08/01/2023   HCT 42.1 08/01/2023   MCV 83.4 08/01/2023   PLT 400 08/01/2023   No results found for: IRON, TIBC, FERRITIN  Attestation Statements:   Reviewed by clinician on day of visit: allergies, medications, problem list, medical history, surgical history, family history, social history, and previous encounter notes.  I have reviewed the above documentation for accuracy and completeness, and I agree with the above. -  Rosha Cocker d. Cadyn Fann, NP-C

## 2023-12-01 NOTE — Progress Notes (Signed)
  Office: [phone]  /  Fax: [phone]    Date: December 01, 2023  Appointment Start Time: 11:32am Duration: 21 minutes Provider: Wyatt Fire, Psy.D. Type of Session: Individual Therapy  Location of Patient: Aunt's home (address obtained; private location) Location of Provider: Provider's Home (private office) Type of Contact: Telepsychological Visit via MyChart Video Visit  Session Content: Allen Harding is a 28 y.o. male presenting for a follow-up appointment to address the previously established treatment goal of increasing coping skills.Today's appointment was a telepsychological visit. Allen Harding provided verbal consent for today's telepsychological appointment and he is aware he is responsible for securing confidentiality on his end of the session. Prior to proceeding with today's appointment, Allen Harding's physical location at the time of this appointment was obtained as well a phone number he could be reached at in the event of technical difficulties. Allen Harding and this provider participated in today's telepsychological service.   This provider conducted a brief check-in. Allen Harding shared about his recent trip to the beach. Discussed what went well as it relates to his eating habits while on vacation and what he can do differently in the future. Allen Harding acknowledged being away from home helped reduce stress, make better choices, and engage in portion control. Reflected on what helps him cope with current family stressors. He stated talking about it has been helpful. Reviewed habit stacking. Allen Harding reported an increase in protein intake and eating lunch more regularly; however, noted gaining four pounds. Further explored and processed. Allen Harding described a reduction in engagement in emotional eating behaviors and noted a plan to increase physical activity by joining the [employer]. He also agreed to continue focusing on increasing protein intake. Overall, Allen Harding was receptive to today's appointment as evidenced by openness to  sharing, responsiveness to feedback, and willingness to continue engaging in learned skills.  Mental Status Examination:  Appearance: neat Behavior: appropriate to circumstances Mood: neutral Affect: mood congruent Speech: WNL Eye Contact: intermittent  Psychomotor Activity: WNL Gait: unable to assess Thought Process: linear, logical, and goal directed and denies suicidal, homicidal, and self-harm ideation, plan and intent  Thought Content/Perception: no hallucinations, delusions, bizarre thinking or behavior endorsed or observed Orientation: AAOx4 Memory/Concentration: intact Insight: fair Judgment: fair  Interventions:  Conducted a brief chart review Reviewed content from the previous session Provided positive reinforcement Employed supportive psychotherapy interventions to facilitate reduced distress and to improve coping skills with identified stressors Engaged patient in goal setting  DSM-5 Diagnosis(es): F50.89 Other Specified Feeding or Eating Disorder, Emotional Eating Behaviors and F90.9 Unspecified Attention-Deficit/Hyperactivity Disorder   Treatment Goal & Progress: During the initial appointment with this provider, the following treatment goal was established: increase coping skills. Allen Harding demonstrated progress in his goal as evidenced by increased awareness of hunger patterns, increased awareness of triggers for emotional eating behaviors, and reduction in emotional eating behaviors. Allen Harding also continues to demonstrate willingness to engage in learned skill(s).   Plan: The next appointment is scheduled for 12/16/2023 at 12:30pm, which will be via MyChart Video Visit. The next session will focus on working towards the established treatment goal. Allen Harding will continue with his primary therapist.    Wyatt Fire, PsyD

## 2023-12-05 LAB — COMPREHENSIVE METABOLIC PANEL WITH GFR
ALT: 22 IU/L (ref 0–44)
AST: 19 IU/L (ref 0–40)
Albumin: 3.8 g/dL — ABNORMAL LOW (ref 4.3–5.2)
Alkaline Phosphatase: 97 IU/L (ref 44–121)
BUN/Creatinine Ratio: 13 (ref 9–20)
BUN: 9 mg/dL (ref 6–20)
Bilirubin Total: 0.5 mg/dL (ref 0.0–1.2)
CO2: 22 mmol/L (ref 20–29)
Calcium: 8.8 mg/dL (ref 8.7–10.2)
Chloride: 102 mmol/L (ref 96–106)
Creatinine, Ser: 0.69 mg/dL — ABNORMAL LOW (ref 0.76–1.27)
Globulin, Total: 3.2 g/dL (ref 1.5–4.5)
Glucose: 125 mg/dL — ABNORMAL HIGH (ref 70–99)
Potassium: 4.4 mmol/L (ref 3.5–5.2)
Sodium: 140 mmol/L (ref 134–144)
Total Protein: 7 g/dL (ref 6.0–8.5)
eGFR: 129 mL/min/1.73 (ref >59)

## 2023-12-05 LAB — CBC WITH DIFFERENTIAL/PLATELET
Basophils Absolute: 0.1 x10E3/uL (ref 0.0–0.2)
Basos: 0 %
EOS (ABSOLUTE): 0.5 x10E3/uL — ABNORMAL HIGH (ref 0.0–0.4)
Eos: 3 %
Hematocrit: 44.9 % (ref 37.5–51.0)
Hemoglobin: 14.3 g/dL (ref 13.0–17.7)
Immature Grans (Abs): 0.1 x10E3/uL (ref 0.0–0.1)
Immature Granulocytes: 1 %
Lymphocytes Absolute: 4.1 x10E3/uL — ABNORMAL HIGH (ref 0.7–3.1)
Lymphs: 24 %
MCH: 27.3 pg (ref 26.6–33.0)
MCHC: 31.8 g/dL (ref 31.5–35.7)
MCV: 86 fL (ref 79–97)
Monocytes Absolute: 0.8 x10E3/uL (ref 0.1–0.9)
Monocytes: 5 %
Neutrophils Absolute: 11.3 x10E3/uL — ABNORMAL HIGH (ref 1.4–7.0)
Neutrophils: 67 %
Platelets: 408 x10E3/uL (ref 150–450)
RBC: 5.23 x10E6/uL (ref 4.14–5.80)
RDW: 14.1 % (ref 11.6–15.4)
WBC: 16.7 x10E3/uL — ABNORMAL HIGH (ref 3.4–10.8)

## 2023-12-05 LAB — LIPID PANEL
Chol/HDL Ratio: 4.3 ratio (ref 0.0–5.0)
Cholesterol, Total: 194 mg/dL (ref 100–199)
HDL: 45 mg/dL (ref >39)
LDL Chol Calc (NIH): 127 mg/dL — ABNORMAL HIGH (ref 0–99)
Triglycerides: 123 mg/dL (ref 0–149)
VLDL Cholesterol Cal: 22 mg/dL (ref 5–40)

## 2023-12-05 LAB — TESTOSTERONE, FREE, TOTAL, SHBG
Sex Hormone Binding: 19.2 nmol/L (ref 16.5–55.9)
Testosterone, Free: 8.7 pg/mL — ABNORMAL LOW (ref 9.3–26.5)
Testosterone: 171 ng/dL — ABNORMAL LOW (ref 264–916)

## 2023-12-05 LAB — PSA: Prostate Specific Ag, Serum: 0.2 ng/mL (ref 0.0–4.0)

## 2023-12-09 ENCOUNTER — Other Ambulatory Visit: Payer: Self-pay | Admitting: "Endocrinology

## 2023-12-10 ENCOUNTER — Other Ambulatory Visit (HOSPITAL_COMMUNITY): Payer: Self-pay

## 2023-12-10 ENCOUNTER — Telehealth: Payer: Self-pay | Admitting: Pharmacy Technician

## 2023-12-10 NOTE — Telephone Encounter (Signed)
 Pharmacy Patient Advocate Encounter   Received notification from CoverMyMeds that prior authorization for Ozempic  (1 MG/DOSE) 4MG /3ML pen-injectors is required/requested.   Insurance verification completed.   The patient is insured through [employer] .   Per test claim: PA required; PA submitted to above mentioned insurance via CoverMyMeds Key/confirmation #/EOC [id number] Status is pending

## 2023-12-11 ENCOUNTER — Encounter: Payer: Self-pay | Admitting: "Endocrinology

## 2023-12-11 ENCOUNTER — Ambulatory Visit: Payer: MEDICAID | Admitting: "Endocrinology

## 2023-12-11 ENCOUNTER — Other Ambulatory Visit (HOSPITAL_COMMUNITY): Payer: Self-pay

## 2023-12-11 VITALS — BP 144/108 | HR 72 | Ht 70.0 in | Wt >= 6400 oz

## 2023-12-11 DIAGNOSIS — I10 Essential (primary) hypertension: Secondary | ICD-10-CM

## 2023-12-11 DIAGNOSIS — E782 Mixed hyperlipidemia: Secondary | ICD-10-CM

## 2023-12-11 DIAGNOSIS — E119 Type 2 diabetes mellitus without complications: Secondary | ICD-10-CM

## 2023-12-11 DIAGNOSIS — E291 Testicular hypofunction: Secondary | ICD-10-CM | POA: Diagnosis not present

## 2023-12-11 DIAGNOSIS — Z7985 Long-term (current) use of injectable non-insulin antidiabetic drugs: Secondary | ICD-10-CM

## 2023-12-11 LAB — POCT GLYCOSYLATED HEMOGLOBIN (HGB A1C): HbA1c, POC (controlled diabetic range): 7 % (ref 0.0–7.0)

## 2023-12-11 MED ORDER — SEMAGLUTIDE (2 MG/DOSE) 8 MG/3ML ~~LOC~~ SOPN: 2.0000 mg | PEN_INJECTOR | SUBCUTANEOUS | 1 refills | Status: DC

## 2023-12-11 MED ORDER — TESTOSTERONE CYPIONATE 200 MG/ML IM SOLN: INTRAMUSCULAR | 0 refills | Status: AC

## 2023-12-11 NOTE — Telephone Encounter (Signed)
 Pharmacy Patient Advocate Encounter  Received notification from [facility] that Prior Authorization for Ozempic  (1 MG/DOSE) 4MG /3ML pen-injectors has been APPROVED from 12/11/23 to 12/10/24   PA #/Case ID/Reference #: [id number]  **Provider just discontinued this medication today and increased his dose.**

## 2023-12-11 NOTE — Patient Instructions (Signed)

## 2023-12-11 NOTE — Progress Notes (Signed)
 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   SOB (shortness of breath) on exertion    Trouble in sleeping    Weakness    Past Surgical History:  Procedure Laterality Date   CIRCUMCISION     CIRCUMCISION REVISION     FRENULECTOMY, LINGUAL     lipoma     ORCHIOPEXY     Social History   Socioeconomic History   Marital status: Single    Spouse name: Not on file   Number of children: Not on file   Years of education: Not on file   Highest education level: 12th grade  Occupational History   Not on file  Tobacco Use   Smoking status: Former    Current packs/day: 0.00    Types: Cigarettes    Quit date: 01/09/2014    Years since quitting: 9.9   Smokeless tobacco: Never  Vaping Use   Vaping status: Never Used  Substance and Sexual Activity   Alcohol use: Yes    Comment: socially   Drug use: No   Sexual activity: Not on file  Other Topics Concern   Not on file  Social History Narrative   Lives with mom, sister, 2 nieces, grandparents and sister's fiance.    Social Drivers of Corporate investment banker Strain: Low Risk  (01/08/2023)   Overall Financial Resource Strain (CARDIA)    Difficulty of Paying Living Expenses: Not hard at all  Food Insecurity: No Food Insecurity (06/25/2023)   Hunger Vital Sign    Worried About Running Out of Food in the Last Year: Never true    Ran Out of Food in the Last Year: Never true  Transportation Needs: No Transportation Needs (06/25/2023)   PRAPARE - Administrator, Civil Service (Medical): No     Lack of Transportation (Non-Medical): No  Physical Activity: Insufficiently Active (06/25/2023)   Exercise Vital Sign    Days of Exercise per Week: 3 days    Minutes of Exercise per Session: 30 min  Stress: No Stress Concern Present (03/24/2023)   [employer] of Occupational Health - Occupational Stress Questionnaire    Feeling of Stress : Only a little  Social Connections: Moderately Integrated (03/24/2023)   Social Connection and Isolation Panel    Frequency of Communication with Friends and Family: More than three times a week    Frequency of Social Gatherings with Friends and Family: More than three times a week    Attends Religious Services: More than 4 times per year    Active Member of [employer] or Organizations: Yes    Attends Banker Meetings: More than 4 times per year    Marital Status: Never married   Outpatient Encounter Medications as of 12/11/2023  Medication Sig   Semaglutide , 2 MG/DOSE, 8 MG/3ML SOPN Inject 2 mg as directed once a week.   Accu-Chek Softclix Lancets lancets Use as directed to check glucose  daily. DX: E11.65   Blood Glucose Monitoring Suppl (ACCU-CHEK GUIDE) w/Device KIT Use as directed to check glucose daily. DX: E11.65   Blood Pressure Monitoring KIT 1 Units by Does not apply route daily.   glucose blood (ACCU-CHEK GUIDE) test strip Use as directed to check glucose daily. DX: E11.65   metFORMIN  (GLUCOPHAGE ) 500 MG tablet Take 1 tablet (500 mg total) by mouth daily with breakfast.   metoprolol  succinate (TOPROL -XL) 100 MG 24 hr tablet Take 1 tablet (100 mg total) by mouth daily. Take with or immediately following a meal.   Multiple Vitamin (MULTIVITAMIN) tablet Take 1 tablet by mouth daily.   Olmesartan -amLODIPine -HCTZ (TRIBENZOR) 40-10-25 MG TABS Take 1 tablet by mouth every morning.   oxybutynin  (DITROPAN -XL) 10 MG 24 hr tablet TAKE 1 TABLET BY MOUTH DAILY.   rosuvastatin  (CRESTOR ) 10 MG tablet Take 0.5 tablets (5 mg total) by mouth  daily.   SYRINGE-NEEDLE, DISP, 3 ML 21G X 1-1/2 3 ML MISC Use to inject testosterone  every week   testosterone  cypionate (DEPO-TESTOSTERONE ) 200 MG/ML injection Inject 100 mg ( 1/2 ml every 10 days)   Vitamin D , Ergocalciferol , (DRISDOL ) 1.25 MG (50000 UNIT) CAPS capsule Take 1 capsule (50,000 Units total) by mouth every 3 (three) days.   [DISCONTINUED] OZEMPIC , 1 MG/DOSE, 4 MG/3ML SOPN INJECT 1MG  SUBCUTANEOUSLY AS DIRECTED ONCE A WEEK   [DISCONTINUED] testosterone  cypionate (DEPO-TESTOSTERONE ) 200 MG/ML injection Inject 0.5 mLs (100 mg total) into the muscle every 14 (fourteen) days.   No facility-administered encounter medications on file as of 12/11/2023.   ALLERGIES: No Known Allergies VACCINATION STATUS: Immunization History  Administered Date(s) Administered   DTaP 06/11/1995, 08/06/1995, 09/24/1995, 06/30/1996, 09/24/1999   H1N1 11/24/2008   HIB (PRP-OMP) 06/11/1995, 08/06/1995, 09/24/1995, 06/30/1996   HPV Quadrivalent 02/08/2014   Hepatitis A, Ped/Adol-2 Dose 02/03/2013, 08/19/2013   Hepatitis B May 10, 1995, 05/07/1995, 09/24/1995   IPV 06/11/1995, 08/06/1995, 09/24/1995, 09/24/1999   Influenza Whole 03/22/2009, 06/17/2011, 07/23/2012   Influenza, Seasonal, Injecte, Preservative Fre 03/26/2023   Influenza,inj,Quad PF,6+ Mos 05/02/2015, 03/30/2019, 03/30/2020   MMR 03/31/1996, 09/24/1999   Meningococcal Conjugate 11/24/2008, 08/19/2013   Td 01/07/2006   Tdap 01/07/2006, 03/26/2023   Varicella 03/31/1996, 06/01/1997    Diabetes   28 year old male patient with medical history as above. 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   -He was seen in this clinic until August 2022 with significant inconsistency in his follow-up .  See notes from previous visits.  - He has been following at [facility] until March 2017 with Dr. Dorrene. -He does not know for  sure when he was started on testosterone  therapy.   - After several months off of testosterone , he resumed prior to his last visit at a low dose.  He returns with total testosterone  of 171 improving from 63.   He reports better consistency with his testosterone  currently 100 mg IM every 14 days. He wishes to continue on testosterone  replacement therapy.    He has type 2 diabetes on Ozempic  1 mg subcutaneously weekly.  He returns with progressive weight gain (previously lost 30 pounds), point-of-care A1c of 7% today.  He remains on metformin  500 mg p.o. once a day as well.   -He has dealt with heavy weight almost all of his life, admits to dietary indiscretions including consumption of large quantities of sweetened beverages.  -He remains on Crestor  10 mg p.o. nightly for dyslipidemia.   -He underwent orchidopexy for right-sided undescended testes. -  He denies testicular injury, radiation, infection, STD. -He denies any history of head injury.    Objective:    BP (!) 144/108   Pulse 72   Ht 5' 10 (1.778 m)   Wt (!) 428 lb 6.4 oz (194.3 kg)   BMI 61.47 kg/m   Wt Readings from Last 3 Encounters:  12/11/23 (!) 428 lb 6.4 oz (194.3 kg)  12/01/23 (!) 418 lb (189.6 kg)  10/13/23 (!) 414 lb (187.8 kg)      CMP     Component Value Date/Time   NA 140 12/04/2023 0842   K 4.4 12/04/2023 0842   CL 102 12/04/2023 0842   CO2 22 12/04/2023 0842   GLUCOSE 125 (H) 12/04/2023 0842   GLUCOSE 149 (H) 09/03/2022 0850   BUN 9 12/04/2023 0842   CREATININE 0.69 (L) 12/04/2023 0842   CREATININE 0.65 09/03/2022 0850   CALCIUM  8.8 12/04/2023 0842   PROT 7.0 12/04/2023 0842   ALBUMIN 3.8 (L) 12/04/2023 0842   AST 19 12/04/2023 0842   ALT 22 12/04/2023 0842   ALKPHOS 97 12/04/2023 0842   BILITOT 0.5 12/04/2023 0842   GFRNONAA >60 03/22/2020 1320   GFRNONAA 138 01/26/2020 1047   GFRAA 160 01/26/2020 1047   Diabetic Labs (most recent): Lab Results  Component Value Date   HGBA1C 7.0 12/11/2023    HGBA1C 6.7 (H) 07/09/2023   HGBA1C 7.6 (H) 11/19/2022   MICROALBUR 2.7 08/26/2022   MICROALBUR 11.6 01/26/2020   MICROALBUR 17.4 08/21/2018    Lipid Panel     Component Value Date/Time   CHOL 194 12/04/2023 0842   TRIG 123 12/04/2023 0842   HDL 45 12/04/2023 0842   CHOLHDL 4.3 12/04/2023 0842   CHOLHDL 3.9 09/03/2022 0850   VLDL 21 08/08/2015 1152   LDLCALC 127 (H) 12/04/2023 0842   LDLCALC 130 (H) 09/03/2022 0850   Recent Results (from the past 2160 hours)  Comprehensive metabolic panel     Status: Abnormal   Collection Time: 12/04/23  8:42 AM  Result Value Ref Range   Glucose 125 (H) 70 - 99 mg/dL   BUN 9 6 - 20 mg/dL   Creatinine, Ser 9.30 (L) 0.76 - 1.27 mg/dL   eGFR 870 >40 fO/fpw/8.26   BUN/Creatinine Ratio 13 9 - 20   Sodium 140 134 - 144 mmol/L   Potassium 4.4 3.5 - 5.2 mmol/L   Chloride 102 96 - 106 mmol/L   CO2 22 20 - 29 mmol/L   Calcium  8.8 8.7 - 10.2 mg/dL   Total Protein 7.0 6.0 - 8.5 g/dL   Albumin 3.8 (L) 4.3 - 5.2 g/dL   Globulin, Total 3.2 1.5 - 4.5 g/dL   Bilirubin Total 0.5 0.0 - 1.2 mg/dL   Alkaline Phosphatase 97 44 - 121 IU/L   AST 19 0 - 40 IU/L   ALT 22 0 - 44 IU/L  Lipid panel     Status: Abnormal   Collection Time: 12/04/23  8:42 AM  Result Value Ref Range   Cholesterol, Total 194 100 - 199 mg/dL   Triglycerides 876 0 - 149 mg/dL   HDL 45 >60 mg/dL   VLDL Cholesterol Cal 22 5 - 40 mg/dL   LDL Chol Calc (NIH) 872 (H) 0 - 99 mg/dL   Chol/HDL Ratio 4.3 0.0 - 5.0 ratio    Comment:  T. Chol/HDL Ratio                                             Men  Women                               1/2 Avg.Risk  3.4    3.3                                   Avg.Risk  5.0    4.4                                2X Avg.Risk  9.6    7.1                                3X Avg.Risk 23.4   11.0   Testosterone , Free, Total, SHBG     Status: Abnormal   Collection Time: 12/04/23  8:42 AM  Result Value Ref Range   Testosterone   171 (L) 264 - 916 ng/dL    Comment: Adult male reference interval is based on a population of healthy nonobese males (BMI <30) between 40 and 15 years old. Travison, et.al. JCEM [phone]. PMID: 71675896.    Testosterone , Free 8.7 (L) 9.3 - 26.5 pg/mL   Sex Hormone Binding 19.2 16.5 - 55.9 nmol/L  CBC with Differential/Platelet     Status: Abnormal   Collection Time: 12/04/23  8:42 AM  Result Value Ref Range   WBC 16.7 (H) 3.4 - 10.8 x10E3/uL   RBC 5.23 4.14 - 5.80 x10E6/uL   Hemoglobin 14.3 13.0 - 17.7 g/dL   Hematocrit 55.0 62.4 - 51.0 %   MCV 86 79 - 97 fL   MCH 27.3 26.6 - 33.0 pg   MCHC 31.8 31.5 - 35.7 g/dL   RDW 85.8 88.3 - 84.5 %   Platelets 408 150 - 450 x10E3/uL   Neutrophils 67 Not Estab. %   Lymphs 24 Not Estab. %   Monocytes 5 Not Estab. %   Eos 3 Not Estab. %   Basos 0 Not Estab. %   Neutrophils Absolute 11.3 (H) 1.4 - 7.0 x10E3/uL   Lymphocytes Absolute 4.1 (H) 0.7 - 3.1 x10E3/uL   Monocytes Absolute 0.8 0.1 - 0.9 x10E3/uL   EOS (ABSOLUTE) 0.5 (H) 0.0 - 0.4 x10E3/uL   Basophils Absolute 0.1 0.0 - 0.2 x10E3/uL   Immature Granulocytes 1 Not Estab. %   Immature Grans (Abs) 0.1 0.0 - 0.1 x10E3/uL  PSA     Status: None   Collection Time: 12/04/23  8:42 AM  Result Value Ref Range   Prostate Specific Ag, Serum 0.2 0.0 - 4.0 ng/mL    Comment: Roche ECLIA methodology. According to the American Urological Association, Serum PSA should decrease and remain at undetectable levels after radical prostatectomy. The AUA defines biochemical recurrence as an initial PSA value 0.2 ng/mL or greater followed by a subsequent confirmatory PSA value 0.2 ng/mL or greater. Values obtained with different assay methods or kits cannot be used interchangeably. Results cannot be interpreted as absolute evidence of the presence or absence of malignant disease.  HgB A1c     Status: None   Collection Time: 12/11/23  2:56 PM  Result Value Ref Range   Hemoglobin A1C     HbA1c POC  (<> result, manual entry)     HbA1c, POC (prediabetic range)     HbA1c, POC (controlled diabetic range) 7.0 0.0 - 7.0 %     Assessment & Plan:   1.  Type 2 diabetes -new diagnosis  -His point-of-care A1c is 7% increasing from 6.7%.  In light of his major insulin  resistance, metabolic dysfunction including morbid obesity, dyslipidemia, sedentary life, he needed additional intervention with GLP-1 receptor agonist.    I approached him for higher dose of Ozempic  and he agrees.  I discussed and prescribe Ozempic  2 mg subcutaneously weekly.    Side effects and precautions discussed with him.   He will continue to benefit from low-dose metformin , 500 mg p.o. daily at breakfast. - he acknowledges that there is a room for improvement in his food and drink choices. - Suggestion is made for him to avoid simple carbohydrates  from his diet including Cakes, Sweet Desserts, Ice Cream, Soda (diet and regular), Sweet Tea, Candies, Chips, Cookies, Store Bought Juices, Alcohol , Artificial Sweeteners,  Coffee Creamer, and Sugar-free Products, Lemonade. This will help patient to have more stable blood glucose profile and potentially avoid unintended weight gain.  His current BMI is 61.47  his major medical and cardiovascular risk. Whole food plant-based diet was briefly discussed-see above, did not engage optimally.  2. Hypogonadism  - Etiology most likely multifactorial including chromosomal abnormality and history of undescended testes.  - He has required testosterone  supplement for several years now. I have reviewed his EMR records and found out that he had low testosterone  starting from at least from 2012.  -I have not seen FSH/LH levels, would have  been useful prior to initiation of testosterone  therapy however the utility of that test now is unremarkable. 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  He wishes to be continued on testosterone  supplement.  I discussed and increased his testosterone  to  100 mg IM every 10 days.  This will give him a total of 300 mg of testosterone  monthly.  Therapeutic goal is to keep his total testosterone  above 250.  -He will need repeat testosterone  measurement along with CBC during his next visit. -Safe and proper utility of androgen replacement therapy was discussed with him.   3. Undescended right testicle - He is status post orchidopexy of right testicle. The details of his surgical history are not available for review.  4) hypertension -His blood pressure is not controlled to target.  He is on a new medication list for blood pressure including Tribenzor-olmesartan -amlodipine -HCTZ 40 - 10 - 25 mg p.o. daily, metoprolol  100 mg p.o. daily.  He is advised to continue.  Salt restrictions and exercise were discussed with him.  5) hyperlipidemia  -His most recent lipid panel showed persistent elevation of LDL at 127.  He is advised to continue Crestor  10 mg p.o. nightly.  Side effects and precautions discussed with him.    He is advised to continue close follow-up with his PMD Dr. Carlette.   I spent  41  minutes in the care of the patient today including review of labs from Thyroid  Function, CMP, and other relevant labs ; imaging/biopsy records (current and previous including abstractions from other facilities); face-to-face time discussing  his lab results and symptoms, medications doses, his options of short and long term treatment based on  the latest standards of care / guidelines;   and documenting the encounter.  Allen Harding  participated in the discussions, expressed understanding, and voiced agreement with the above plans.  All questions were answered to his satisfaction. he is encouraged to contact clinic should he have any questions or concerns prior to his return visit.  Follow up plan: Return in about 4 months (around 04/12/2024) for Fasting Labs  in AM B4 8, A1c -[place].  Ranny Earl, MD Phone: [phone]  Fax: [phone]   This note was  partially dictated with voice recognition software. Similar sounding words can be transcribed inadequately or may not  be corrected upon review.  12/11/2023, 4:24 PM

## 2023-12-16 ENCOUNTER — Telehealth (INDEPENDENT_AMBULATORY_CARE_PROVIDER_SITE_OTHER): Payer: MEDICAID | Admitting: Psychology

## 2023-12-16 DIAGNOSIS — F909 Attention-deficit hyperactivity disorder, unspecified type: Secondary | ICD-10-CM

## 2023-12-16 DIAGNOSIS — F5089 Other specified eating disorder: Secondary | ICD-10-CM | POA: Diagnosis not present

## 2023-12-16 NOTE — Progress Notes (Signed)
 Office: [phone]  /  Fax: [phone]    Date: December 16, 2023  Appointment Start Time: 12:38pm Duration: 25 minutes Provider: Wyatt Fire, Psy.D. Type of Session: Individual Therapy  Location of Patient: Home (private location) Location of Provider: Provider's Home (private office) Type of Contact: Telepsychological Visit via MyChart Video Visit  Session Content: Allen Harding is a 28 y.o. male presenting for a follow-up appointment to address the previously established treatment goal of increasing coping skills. Today's appointment was a telepsychological visit. Allen Harding provided verbal consent for today's telepsychological appointment and he is aware he is responsible for securing confidentiality on his end of the session. Prior to proceeding with today's appointment, Allen Harding's physical location at the time of this appointment was obtained as well a phone number he could be reached at in the event of technical difficulties. Allen Harding and this provider participated in today's telepsychological service.   This provider conducted a brief check-in. Allen Harding stated he is currently reading The [employer]. He further shared there continues to be conflict with his aunts. Regarding eating habits, Allen Harding stated he is regularly eating lunch and dinner, noting There are just days that I don't want to come out and do anything. He explained he is not a very social person resulting in him often staying in his room. Further explored and processed, and identified barriers that may be playing a role in being able to be consistent and adhere to his prescribed structured meal plan. Allen Harding identified the following: living with his aunt, relying on others for groceries, relying on others for meals, and frequent consumption of sugary/high calorie drinks. He also reported engagement in stress eating due to his current situation with his aunts, adding he is unsure regarding the frequency. This provider recommended he discuss  ongoing family conflict with his primary therapist further. To help assist with the identified barriers, Allen Harding was engaged in problem solving. Allen Harding agreed to implement the following: consume water with Cirkul bottle and zero calorie sparkling water; reduce Powerade consumption from 2-3 20oz bottles daily to 1 20oz a day; request a copy of Category 4 structured meal plan handout from mom and/or Rockie Dalton, NP-C; and utilize the Category 4 structured meal plan handout to make choices when having to make a meal on his own. Overall, Allen Harding was receptive to today's appointment as evidenced by openness to sharing, responsiveness to feedback, and willingness to implement discussed strategies .  Mental Status Examination:  Appearance: neat Behavior: appropriate to circumstances Mood: neutral Affect: mood congruent Speech: WNL Eye Contact: intermittent  Psychomotor Activity: WNL Gait: unable to assess Thought Process: linear, logical, and goal directed and denies suicidal, homicidal, and self-harm ideation, plan and intent  Thought Content/Perception: no hallucinations, delusions, bizarre thinking or behavior endorsed or observed Orientation: AAOx4 Memory/Concentration: intact Insight: fair Judgment: fair  Interventions:  Conducted a brief chart review Conducted a risk assessment Provided empathic reflections and validation Reviewed content from the previous session Employed supportive psychotherapy interventions to facilitate reduced distress and to improve coping skills with identified stressors Engaged patient in problem solving  DSM-5 Diagnosis(es): F50.89 Other Specified Feeding or Eating Disorder, Emotional Eating Behaviors and F90.9 Unspecified Attention-Deficit/Hyperactivity Disorder   Treatment Goal & Progress: During the initial appointment with this provider, the following treatment goal was established: increase coping skills. Allen Harding demonstrated progress in his goal as evidenced by  increased awareness of hunger patterns, increased awareness of triggers for emotional eating behaviors, and reduction in emotional eating behaviors. Allen Harding also continues to demonstrate willingness  to engage in learned skill(s).   Plan: The next appointment is scheduled for 12/30/2023 at 2:30pm, which will be via MyChart Video Visit. The next session will focus on working towards the established treatment goal. Allen Harding will continue with his primary therapist.    Wyatt Fire, PsyD

## 2023-12-26 ENCOUNTER — Encounter: Payer: Self-pay | Admitting: Pharmacist

## 2023-12-26 ENCOUNTER — Ambulatory Visit: Payer: MEDICAID | Attending: Internal Medicine | Admitting: Pharmacist

## 2023-12-26 VITALS — BP 146/99 | HR 107

## 2023-12-26 DIAGNOSIS — I1A0 Resistant hypertension: Secondary | ICD-10-CM | POA: Diagnosis present

## 2023-12-26 MED ORDER — HYDRALAZINE HCL 25 MG PO TABS: 25.0000 mg | ORAL_TABLET | Freq: Three times a day (TID) | ORAL | 1 refills | Status: DC

## 2023-12-26 NOTE — Progress Notes (Signed)
 Patient ID: Allen Harding                 DOB: 12-21-1995                      MRN: [id number]     HPI: Allen Harding is a 28 y.o. male referred by Dr. Raford to HTN clinic. PMH is significant for HTN, T2DM, hypogonadism, obesity, HLD, autism and ADHD.  Patient presents today with his mother.  Lives with mother who manages his medications. Has a BP ciff at home but has not checked home BP. Has also not been monitoring BG.  Currently on Tribenzor and Toprol  100mg  daily. Reports no adverse effects. Reports compliance but is interested in prepackaged meds. Tribenzor increased to max dose at last visit.  Currently on high protein diet as prescribed by [facility]. His mother cooks his meals. Now on Ozempic  2mg  weekly.  Continues to drink sports drinks daily.  Current HTN meds:  Toprol  100mg  daily Olemsartan/amlodipine /hydrochlorothiazide 40/10/25  BP goal: <130/80   Wt Readings from Last 3 Encounters:  08/13/23 (!) 411 lb (186.4 kg)  08/07/23 (!) 416 lb 14.4 oz (189.1 kg)  07/21/23 (!) 416 lb (188.7 kg)   BP Readings from Last 3 Encounters:  08/13/23 129/88  08/07/23 (!) 183/125  07/21/23 (!) 144/98   Pulse Readings from Last 3 Encounters:  08/13/23 (!) 109  08/07/23 (!) 118  07/21/23 92    Renal function: CrCl cannot be calculated (Patient's most recent lab result is older than the maximum 21 days allowed.).  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 DX: E11.65 200 each 5   Blood Glucose Monitoring Suppl (ACCU-CHEK GUIDE) w/Device KIT Use as directed to check glucose daily. DX: E11.65 1 kit 1   Blood Pressure Monitoring KIT 1 Units by Does not apply route daily. 1 kit 0   glucose blood (ACCU-CHEK GUIDE) test strip Use as directed to check glucose daily. DX: E11.65 200 each 5   metFORMIN  (GLUCOPHAGE ) 500 MG tablet Take 1 tablet (500 mg total) by mouth daily with breakfast. 90 tablet 0   metoprolol  succinate (TOPROL -XL) 100 MG 24 hr tablet Take 1 tablet (100 mg total) by mouth daily. Take with or immediately following a meal. 90 tablet 3   Multiple Vitamin (MULTIVITAMIN) tablet Take 1 tablet by mouth daily.     Olmesartan -amLODIPine -HCTZ (TRIBENZOR) 40-5-25 MG TABS Take 1 tablet by mouth daily. 90 tablet 3   oxybutynin  (DITROPAN -XL) 10 MG 24 hr tablet TAKE 1 TABLET BY MOUTH DAILY. 30 tablet 3   rosuvastatin  (CRESTOR ) 10 MG tablet Take 0.5 tablets (5 mg total) by mouth daily. 90 tablet 1   Semaglutide , 1 MG/DOSE, 4 MG/3ML SOPN Inject 1 mg as directed once a week. 6 mL 1   SYRINGE-NEEDLE, DISP, 3 ML 21G  X 1-1/2 3 ML MISC Use to inject testosterone  every week 50 each 1   testosterone  cypionate (DEPO-TESTOSTERONE ) 200 MG/ML injection Inject 0.5 mLs (100 mg total) into the muscle every 14 (fourteen) days. 10 mL 0   Vitamin D , Ergocalciferol , (DRISDOL ) 1.25 MG (50000 UNIT) CAPS capsule Take 1 capsule (50,000 Units total) by mouth every 3 (three) days. 12 capsule 0   No current facility-administered medications on file prior to visit.    No Known Allergies   Assessment/Plan:  1. Hypertension -  Patient's BP in room today 146/99 which is above goal of <130/80 but improved since last visit.   Recommended he begin checking his BP at home more regularly. Recommended continuing to drink water and decrease drinking sports drinks due to high sodium and sugar content. Increase physical activity. This was recommended previously as  well.  Will start hydralazine 25mg  TID since patient on max dose ARB/CCB/thiazide. Counseled on mechanism and possible side effects. Patient not sure he will remember to take three times daily but believes he will be able to take at least twice daily.   Continue Metoprolol  succinate 100mg  daily Continue amlodipine /hydrochlorothiazide/olmesartan  03/28/39 Start hydralazine 25mg  tID Recheck in 6 weeks  Chris Rai Sinagra, PharmD, BCACP, CDCES, CPP [facility] [address], [place], [place] [address] Phone: [phone]; Fax: [phone] 12/26/2023 12:13 PM

## 2023-12-26 NOTE — Patient Instructions (Signed)
 Good seeing you again  We would like your blood pressure to be less than 130/80  Please continue your olmesartan /amlodipine /hydrochlorothiazide every morning  Please continue your metoprolol  once a day  We will start a new medication called hydralazine 25mg . You can take it up to 3 times a day  I will see you back in 6 weeks  Chris Val Farnam, PharmD, BCACP, CDCES, CPP [facility] [address] [place], [place] [address] Phone: [phone]; Fax: [phone] 12/26/2023 10:21 AM

## 2023-12-30 ENCOUNTER — Telehealth (INDEPENDENT_AMBULATORY_CARE_PROVIDER_SITE_OTHER): Payer: MEDICAID | Admitting: Psychology

## 2023-12-30 DIAGNOSIS — F909 Attention-deficit hyperactivity disorder, unspecified type: Secondary | ICD-10-CM

## 2023-12-30 DIAGNOSIS — F5089 Other specified eating disorder: Secondary | ICD-10-CM

## 2023-12-30 NOTE — Progress Notes (Signed)
  Office: [phone]  /  Fax: [phone]    Date: December 30, 2023  Appointment Start Time: 2:38pm Duration: 20 minutes Provider: Wyatt Fire, Psy.D. Type of Session: Individual Therapy  Location of Patient: Home (private location) Location of Provider: Provider's Home (private office) Type of Contact: Telepsychological Visit via MyChart Video Visit  Session Content: Allen Harding is a 28 y.o. male presenting for a follow-up appointment to address the previously established treatment goal of increasing coping skills.Today's appointment was a telepsychological visit. [name] provided verbal consent for today's telepsychological appointment and he is aware he is responsible for securing confidentiality on his end of the session. Prior to proceeding with today's appointment, [name]'s physical location at the time of this appointment was obtained as well a phone number he could be reached at in the event of technical difficulties. [name] and this provider participated in today's telepsychological service.   This provider conducted a brief check-in. [name] stated he is now taking Ozempic  and it has helped. Reviewed goals set during the last appointment with this provider. He indicated while he is not drinking sparkling water, he has reduced his Powerade intake and has increased water intake. He indicated he forgot to obtain a copy of his structured meal plan when he could not find it, but agreed to make a note for himself on his phone. He denied any other current concerns and reported a reduction in emotional eating behaviors. Remainder of session focused further on mindful eating, as it was identified [name]'s eating habits/choices are out of habit and not always congruent to his goals with the clinic. He was encouraged to identify eating habits that help and hinder eating congruent to his goals. Overall, [name] was receptive to today's appointment as evidenced by openness to sharing, responsiveness to feedback, and  willingness to continue engaging in learned skills.  Mental Status Examination:  Appearance: neat Behavior: appropriate to circumstances Mood: neutral Affect: mood congruent Speech: WNL Eye Contact: intermittent  Psychomotor Activity: WNL Gait: unable to assess Thought Process: linear, logical, and goal directed and denies suicidal, homicidal, and self-harm ideation, plan and intent  Thought Content/Perception: no hallucinations, delusions, bizarre thinking or behavior endorsed or observed Orientation: AAOx4 Memory/Concentration: intact Insight: fair Judgment: fair  Interventions:  Conducted a brief chart review Conducted a risk assessment Provided empathic reflections and validation Reviewed content from the previous session Provided positive reinforcement Employed supportive psychotherapy interventions to facilitate reduced distress and to improve coping skills with identified stressors Psychoeducation provided regarding additional mindful eating strategies  DSM-5 Diagnosis(es): F50.89 Other Specified Feeding or Eating Disorder, Emotional Eating Behaviors and F90.9 Unspecified Attention-Deficit/Hyperactivity Disorder   Treatment Goal & Progress: During the initial appointment with this provider, the following treatment goal was established: increase coping skills. [name] demonstrated progress in his goal as evidenced by increased awareness of hunger patterns, increased awareness of triggers for emotional eating behaviors, and reduction in emotional eating behaviors. [name] also continues to demonstrate willingness to engage in learned skill(s) (e.g., mindful eating strategies).   Plan: Today was [name]'s last appointment with this provider, as he plans to continue with his primary therapist.  He acknowledged understanding that he may request a follow-up appointment with this provider in the future as long as he is still established with the clinic. No further follow-up planned by this  provider.    Wyatt Fire, PsyD

## 2024-01-01 ENCOUNTER — Ambulatory Visit (INDEPENDENT_AMBULATORY_CARE_PROVIDER_SITE_OTHER): Payer: MEDICAID | Admitting: Adult Health

## 2024-01-01 ENCOUNTER — Encounter (INDEPENDENT_AMBULATORY_CARE_PROVIDER_SITE_OTHER): Payer: Self-pay | Admitting: Adult Health

## 2024-01-01 DIAGNOSIS — E1169 Type 2 diabetes mellitus with other specified complication: Secondary | ICD-10-CM

## 2024-01-01 DIAGNOSIS — I152 Hypertension secondary to endocrine disorders: Secondary | ICD-10-CM

## 2024-01-01 DIAGNOSIS — Z7985 Long-term (current) use of injectable non-insulin antidiabetic drugs: Secondary | ICD-10-CM

## 2024-01-01 DIAGNOSIS — Z6841 Body Mass Index (BMI) 40.0 and over, adult: Secondary | ICD-10-CM

## 2024-01-01 DIAGNOSIS — E669 Obesity, unspecified: Secondary | ICD-10-CM | POA: Diagnosis not present

## 2024-01-01 DIAGNOSIS — E1159 Type 2 diabetes mellitus with other circulatory complications: Secondary | ICD-10-CM | POA: Diagnosis not present

## 2024-01-01 DIAGNOSIS — E559 Vitamin D deficiency, unspecified: Secondary | ICD-10-CM

## 2024-01-01 NOTE — Progress Notes (Signed)
 WEIGHT SUMMARY AND BIOMETRICS  Vitals Temp: 97.6 F (36.4 C) BP: 129/82 Pulse Rate: 94 SpO2: 96 %   Anthropometric Measurements Height: 5' 10 (1.778 m) Weight: (!) 414 lb (187.8 kg) BMI (Calculated): 59.4 Weight at Last Visit: 418 lb Weight Lost Since Last Visit: 4 lb Weight Gained Since Last Visit: 0 Starting Weight: 441 lb Total Weight Loss (lbs): 27 lb (12.2 kg)   Body Composition  Body Fat %: 46.4 % Fat Mass (lbs): 192.4 lbs Muscle Mass (lbs): 211.6 lbs Total Body Water (lbs): 156 lbs Visceral Fat Rating : 35   Other Clinical Data Fasting: yes Labs: yes Today's Visit #: 13 Starting Date: 11/19/22    Chief Complaint:   OBESITY Allen Harding is here to discuss his progress with his obesity treatment plan.  He is on the the Category 4 Plan and states he is following his eating plan approximately 50 % of the time.  He states he is exercising: NONE- he has been instructed to refrain from strenuous exercise until uncontrolled HTN is safely managed  Interim History:  12/11/2023 Endo/Dr. Nida recently increased Ozemic 1mg  to 2mg   Reviewed Bioimpedance Results with pt: Muscle Mass: +1.4 lbs Adipose Mass: -5 lbs  Exercise- NONE- he has been instructed to refrain from strenuous exercise until uncontrolled HTN is safely managed  Hydration-he is drinking at least 2 PowerAde sports drinks daily He also drinking water via [employer]  Of note- pt's mother is at [facility] during [facility]  Subjective:   1. Type 2 diabetes mellitus with morbid obesity (HCC) Lab Results  Component Value Date   HGBA1C 7.0 12/11/2023   HGBA1C 6.7 (H) 07/09/2023   HGBA1C 7.6 (H) 11/19/2022    12/11/2023 Endo/Dr. Nida recently increased Ozemic 1mg  to 2mg  He believes he has had two doses at increased GLP-1 strength Denies mass in neck, dysphagia, dyspepsia, persistent hoarseness, abdominal pain, or N/V/C  He is not checking home CBG He denies sx's of hypoglycemia  2. Vitamin D   deficiency  Latest Reference Range & Units 11/19/22 10:19 07/09/23 11:07 07/09/23 11:08  Vitamin D , 25-Hydroxy 30.0 - 100.0 ng/mL 31.1 18.8 (L) 21.1 (L)  (L): Data is abnormally low  He is on weekly Ergocalciferol - denies N/V/Muscle Weakness  3. Hypertension associated with type 2 diabetes mellitus (HCC) BP much improved! He is currently on metoprolol  succinate (TOPROL -XL) 100 MG 24 hr tablet  rosuvastatin  (CRESTOR ) 10 MG tablet  Olmesartan -amLODIPine -HCTZ (TRIBENZOR) 40-10-25 MG TABS  hydrALAZINE  (APRESOLINE ) 25 MG tablet   He has been instructed to refrain from strenuous exercise until uncontrolled HTN is safely managed  Assessment/Plan:   1. Type 2 diabetes mellitus with morbid obesity (HCC) (Primary) Check Labs - Insulin , random - Vitamin B12  2. Vitamin D  deficiency Check Labs - VITAMIN D  25 Hydroxy (Vit-D Deficiency, Fractures)  3. Hypertension associated with type 2 diabetes mellitus (HCC) Continue current antihypertensive therapy and limit Na+ Limit Sports drinks Continue to avoid  exertion  4. BMI 60.0-69.9, adult (HCC)- current BMI 59.5  Allen Harding is currently in the action stage of change. As such, his goal is to continue with weight loss efforts. He has agreed to the Category 4 Plan.   Exercise goals: No exercise has been prescribed at this time.  Behavioral modification strategies: increasing lean protein intake, decreasing simple carbohydrates, increasing vegetables, increasing water intake, meal planning and cooking strategies, keeping healthy foods in the home, ways to avoid boredom eating, ways to avoid night time snacking, and planning for success.  Allen Harding has agreed to follow-up with our clinic in 4 weeks. He was informed of the importance of frequent follow-up visits to maximize his success with intensive lifestyle modifications for his multiple health conditions.   Allen Harding was informed we would discuss his lab results at his next visit unless there is a  critical issue that needs to be addressed sooner. [name] agreed to keep his next visit at the agreed upon time to discuss these results.  Objective:   Blood pressure 129/82, pulse 94, temperature 97.6 F (36.4 C), height 5' 10 (1.778 m), weight (!) 414 lb (187.8 kg), SpO2 96%. Body mass index is 59.4 kg/m.  General: Cooperative, alert, well developed, in no acute distress. HEENT: Conjunctivae and lids unremarkable. Cardiovascular: Regular rhythm.  Lungs: Normal work of breathing. Neurologic: No focal deficits.   Lab Results  Component Value Date   CREATININE 0.69 (L) 12/04/2023   BUN 9 12/04/2023   NA 140 12/04/2023   K 4.4 12/04/2023   CL 102 12/04/2023   CO2 22 12/04/2023   Lab Results  Component Value Date   ALT 22 12/04/2023   AST 19 12/04/2023   ALKPHOS 97 12/04/2023   BILITOT 0.5 12/04/2023   Lab Results  Component Value Date   HGBA1C 7.0 12/11/2023   HGBA1C 6.7 (H) 07/09/2023   HGBA1C 7.6 (H) 11/19/2022   HGBA1C 7.6 (H) 09/03/2022   HGBA1C 6.1 01/23/2021   Lab Results  Component Value Date   INSULIN  46.9 (H) 07/09/2023   INSULIN  43.8 (H) 11/19/2022   Lab Results  Component Value Date   TSH 3.580 11/19/2022   Lab Results  Component Value Date   CHOL 194 12/04/2023   HDL 45 12/04/2023   LDLCALC 127 (H) 12/04/2023   TRIG 123 12/04/2023   CHOLHDL 4.3 12/04/2023   Lab Results  Component Value Date   VD25OH 21.1 (L) 07/09/2023   VD25OH 18.8 (L) 07/09/2023   VD25OH 31.1 11/19/2022   Lab Results  Component Value Date   WBC 16.7 (H) 12/04/2023   HGB 14.3 12/04/2023   HCT 44.9 12/04/2023   MCV 86 12/04/2023   PLT 408 12/04/2023   No results found for: IRON, TIBC, FERRITIN  Attestation Statements:   Reviewed by clinician on day of visit: allergies, medications, problem list, medical history, surgical history, family history, social history, and previous encounter notes.  I have reviewed the above documentation for accuracy and  completeness, and I agree with the above. -  Yadier Bramhall d. Silas Muff, NP-C

## 2024-01-04 LAB — INSULIN, RANDOM: INSULIN: 44.7 u[IU]/mL — ABNORMAL HIGH (ref 2.6–24.9)

## 2024-01-04 LAB — VITAMIN B12: Vitamin B-12: 460 pg/mL (ref 232–1245)

## 2024-01-04 LAB — VITAMIN D 25 HYDROXY (VIT D DEFICIENCY, FRACTURES): Vit D, 25-Hydroxy: 26 ng/mL — AB (ref 30.0–100.0)

## 2024-02-04 ENCOUNTER — Other Ambulatory Visit: Payer: Self-pay

## 2024-02-04 DIAGNOSIS — D75839 Thrombocytosis, unspecified: Secondary | ICD-10-CM

## 2024-02-04 DIAGNOSIS — D72829 Elevated white blood cell count, unspecified: Secondary | ICD-10-CM

## 2024-02-05 ENCOUNTER — Inpatient Hospital Stay: Payer: MEDICAID | Attending: Oncology

## 2024-02-05 DIAGNOSIS — D75839 Thrombocytosis, unspecified: Secondary | ICD-10-CM | POA: Insufficient documentation

## 2024-02-05 DIAGNOSIS — D72829 Elevated white blood cell count, unspecified: Secondary | ICD-10-CM | POA: Diagnosis present

## 2024-02-05 LAB — LACTATE DEHYDROGENASE: LDH: 203 U/L — ABNORMAL HIGH (ref 98–192)

## 2024-02-09 NOTE — Progress Notes (Deleted)
 Patient ID: Allen Harding                 DOB: 12-21-1995                      MRN: [id number]     HPI: Allen Harding is a 28 y.o. male referred by Dr. Raford to HTN clinic. PMH is significant for HTN, T2DM, hypogonadism, obesity, HLD, autism and ADHD.  Patient presents today with his mother.  Lives with mother who manages his medications. Has a BP ciff at home but has not checked home BP. Has also not been monitoring BG.  Currently on Tribenzor and Toprol  100mg  daily. Reports no adverse effects. Reports compliance but is interested in prepackaged meds. Tribenzor increased to max dose at last visit.  Currently on high protein diet as prescribed by [facility]. His mother cooks his meals. Now on Ozempic  2mg  weekly.  Continues to drink sports drinks daily.  Current HTN meds:  Toprol  100mg  daily Olemsartan/amlodipine /hydrochlorothiazide 40/10/25  BP goal: <130/80   Wt Readings from Last 3 Encounters:  08/13/23 (!) 411 lb (186.4 kg)  08/07/23 (!) 416 lb 14.4 oz (189.1 kg)  07/21/23 (!) 416 lb (188.7 kg)   BP Readings from Last 3 Encounters:  08/13/23 129/88  08/07/23 (!) 183/125  07/21/23 (!) 144/98   Pulse Readings from Last 3 Encounters:  08/13/23 (!) 109  08/07/23 (!) 118  07/21/23 92    Renal function: CrCl cannot be calculated (Patient's most recent lab result is older than the maximum 21 days allowed.).  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 DX: E11.65 200 each 5   Blood Glucose Monitoring Suppl (ACCU-CHEK GUIDE) w/Device KIT Use as directed to check glucose daily. DX: E11.65 1 kit 1   Blood Pressure Monitoring KIT 1 Units by Does not apply route daily. 1 kit 0   glucose blood (ACCU-CHEK GUIDE) test strip Use as directed to check glucose daily. DX: E11.65 200 each 5   metFORMIN  (GLUCOPHAGE ) 500 MG tablet Take 1 tablet (500 mg total) by mouth daily with breakfast. 90 tablet 0   metoprolol  succinate (TOPROL -XL) 100 MG 24 hr tablet Take 1 tablet (100 mg total) by mouth daily. Take with or immediately following a meal. 90 tablet 3   Multiple Vitamin (MULTIVITAMIN) tablet Take 1 tablet by mouth daily.     Olmesartan -amLODIPine -HCTZ (TRIBENZOR) 40-5-25 MG TABS Take 1 tablet by mouth daily. 90 tablet 3   oxybutynin  (DITROPAN -XL) 10 MG 24 hr tablet TAKE 1 TABLET BY MOUTH DAILY. 30 tablet 3   rosuvastatin  (CRESTOR ) 10 MG tablet Take 0.5 tablets (5 mg total) by mouth daily. 90 tablet 1   Semaglutide , 1 MG/DOSE, 4 MG/3ML SOPN Inject 1 mg as directed once a week. 6 mL 1   SYRINGE-NEEDLE, DISP, 3 ML 21G  X 1-1/2 3 ML MISC Use to inject testosterone  every week 50 each 1   testosterone  cypionate (DEPO-TESTOSTERONE ) 200 MG/ML injection Inject 0.5 mLs (100 mg total) into the muscle every 14 (fourteen) days. 10 mL 0   Vitamin D , Ergocalciferol , (DRISDOL ) 1.25 MG (50000 UNIT) CAPS capsule Take 1 capsule (50,000 Units total) by mouth every 3 (three) days. 12 capsule 0   No current facility-administered medications on file prior to visit.    No Known Allergies   Assessment/Plan:  1. Hypertension -  Patient's BP in room today 146/99 which is above goal of <130/80 but improved since last visit.   Recommended he begin checking his BP at home more regularly. Recommended continuing to drink water and decrease drinking sports drinks due to high sodium and sugar content. Increase physical activity. This was recommended previously as  well.  Will start hydralazine 25mg  TID since patient on max dose ARB/CCB/thiazide. Counseled on mechanism and possible side effects. Patient not sure he will remember to take three times daily but believes he will be able to take at least twice daily.   Continue Metoprolol  succinate 100mg  daily Continue amlodipine /hydrochlorothiazide/olmesartan  03/28/39 Start hydralazine 25mg  tID Recheck in 6 weeks  Chris Rai Sinagra, PharmD, BCACP, CDCES, CPP [facility] [address], [place], [place] [address] Phone: [phone]; Fax: [phone] 12/26/2023 12:13 PM

## 2024-02-10 ENCOUNTER — Encounter (INDEPENDENT_AMBULATORY_CARE_PROVIDER_SITE_OTHER): Payer: Self-pay | Admitting: Adult Health

## 2024-02-10 ENCOUNTER — Ambulatory Visit: Payer: MEDICAID | Admitting: Pharmacist

## 2024-02-10 ENCOUNTER — Ambulatory Visit (INDEPENDENT_AMBULATORY_CARE_PROVIDER_SITE_OTHER): Payer: MEDICAID | Admitting: Adult Health

## 2024-02-10 DIAGNOSIS — E1169 Type 2 diabetes mellitus with other specified complication: Secondary | ICD-10-CM

## 2024-02-10 DIAGNOSIS — E559 Vitamin D deficiency, unspecified: Secondary | ICD-10-CM | POA: Diagnosis not present

## 2024-02-10 DIAGNOSIS — Z7985 Long-term (current) use of injectable non-insulin antidiabetic drugs: Secondary | ICD-10-CM

## 2024-02-10 DIAGNOSIS — E669 Obesity, unspecified: Secondary | ICD-10-CM

## 2024-02-10 DIAGNOSIS — Z6841 Body Mass Index (BMI) 40.0 and over, adult: Secondary | ICD-10-CM

## 2024-02-10 DIAGNOSIS — I152 Hypertension secondary to endocrine disorders: Secondary | ICD-10-CM | POA: Diagnosis not present

## 2024-02-10 DIAGNOSIS — Z7984 Long term (current) use of oral hypoglycemic drugs: Secondary | ICD-10-CM

## 2024-02-10 DIAGNOSIS — E1159 Type 2 diabetes mellitus with other circulatory complications: Secondary | ICD-10-CM

## 2024-02-10 MED ORDER — VITAMIN D (ERGOCALCIFEROL) 1.25 MG (50000 UNIT) PO CAPS: 50000.0000 [IU] | ORAL_CAPSULE | ORAL | 0 refills | Status: DC

## 2024-02-10 NOTE — Progress Notes (Signed)
 WEIGHT SUMMARY AND BIOMETRICS  Vitals Temp: 97.7 F (36.5 C) BP: 137/85 Pulse Rate: (!) 101 SpO2: 96 %   Anthropometric Measurements Height: 5' 10 (1.778 m) Weight: (!) 406 lb (184.2 kg) BMI (Calculated): 58.25 Weight at Last Visit: 414 lb Weight Lost Since Last Visit: 8 lb Weight Gained Since Last Visit: 0 Starting Weight: 441 lb Total Weight Loss (lbs): 35 lb (15.9 kg)   Body Composition  Body Fat %: 46.2 % Fat Mass (lbs): 188 lbs Muscle Mass (lbs): 208.2 lbs Total Body Water (lbs): 151.6 lbs Visceral Fat Rating : 34   Other Clinical Data Fasting: no Labs: no Today's Visit #: 14 Starting Date: 11/19/22    Chief Complaint:   OBESITY Allen Harding is here to discuss his progress with his obesity treatment plan.  He is on the the Category 4 Plan and states he is following his eating plan approximately 75 % of the time.  He states he is exercising [employer] 3 hour 1 times per week.  Interim History:   Exercise- NONE- he has been instructed to refrain from strenuous exercise until uncontrolled HTN is safely managed  He has started attend [employer] on Tuesday evenings.  Current weight 406 lbs Interval Goal to loss to < 390 lbgs Ultimate Goal to loss to < 255 lbs  Of note- pt's mother is at [facility] during OV  Subjective:   1. Vitamin D  deficiency Discussed Labs  Latest Reference Range & Units 01/01/24 12:22  Vitamin D , 25-Hydroxy 30.0 - 100.0 ng/mL 26.0 (L)  (L): Data is abnormally low  Vit D well below goal of 50-70 He reports consistently taking Ergocalciferol  Q3 days  2. Hypertension associated with type 2 diabetes mellitus (HCC) Discussed Labs 01/01/2024 CMP: Electrolytes, Liver Enzymes, and Kidney fx- stable BP goal: <130/80  Today's pressure above goal, however MUCH improved from previous readings He is on  metFORMIN  (GLUCOPHAGE ) 500 MG tablet  metoprolol  succinate (TOPROL -XL) 100 MG 24 hr tablet  rosuvastatin  (CRESTOR ) 10 MG  tablet  Olmesartan -amLODIPine -HCTZ (TRIBENZOR) 40-10-25 MG TABS  Semaglutide , 2 MG/DOSE, 8 MG/3ML SOPN  hydrALAZINE  (APRESOLINE ) 25 MG tablet    3. Type 2 diabetes mellitus with morbid obesity [facility]) Discussed Labs Endocrinology manages  Latest Reference Range & Units 01/01/24 12:22  INSULIN  2.6 - 24.9 uIU/mL 44.7 (H)  (H): Data is abnormally high Lab Results  Component Value Date   HGBA1C 7.0 12/11/2023   HGBA1C 6.7 (H) 07/09/2023   HGBA1C 7.6 (H) 11/19/2022    12/11/2023 Endo/Dr. Nida recently increased Ozemic 1mg  to 2mg    A1c at goal Insulin  level improved, however still well above goal of 5 Endo now manages  metFORMIN  (GLUCOPHAGE ) 500 MG tablet Daily        Semaglutide , 2 MG/DOSE, 8 MG/3ML SOPN Weekly   Assessment/Plan:   1. Vitamin D  deficiency Refill  Vitamin D , Ergocalciferol , (DRISDOL ) 1.25 MG (50000 UNIT) CAPS capsule Take 1 capsule (50,000 Units total) by mouth every 3 (three) days. Dispense: 12 capsule, Refills: 0 ordered   2. Hypertension associated with type 2 diabetes mellitus (HCC) Continue to take all antihypertensives as directed. Continue to limit drinks with high levels of Na+  3. Type 2 diabetes mellitus with morbid obesity (HCC) (Primary) Continue healthy eating and remain active daily. Continue antidiabetic medications per Endocrinology  4. BMI 60.0-69.9, adult (HCC)- current BMI 58.4  Luster is currently in the action stage of change. As such, his goal is to continue with weight loss efforts.  He has agreed to the Category 4 Plan.   Exercise goals: No exercise has been prescribed at this time.  Behavioral modification strategies: increasing lean protein intake, decreasing simple carbohydrates, increasing vegetables, increasing water intake, decreasing liquid calories, decreasing sodium intake, no skipping meals, meal planning and cooking strategies, keeping healthy foods in the home, ways to avoid boredom eating, ways to avoid night time snacking,  better snacking choices, emotional eating strategies, planning for success, and decreasing junk food.  Netanel has agreed to follow-up with our clinic in 4 weeks. He was informed of the importance of frequent follow-up visits to maximize his success with intensive lifestyle modifications for his multiple health conditions.   Objective:   Blood pressure 137/85, pulse (!) 101, temperature 97.7 F (36.5 C), height 5' 10 (1.778 m), weight (!) 406 lb (184.2 kg), SpO2 96%. Body mass index is 58.25 kg/m.  General: Cooperative, alert, well developed, in no acute distress. HEENT: Conjunctivae and lids unremarkable. Cardiovascular: Regular rhythm.  Lungs: Normal work of breathing. Neurologic: No focal deficits.   Lab Results  Component Value Date   CREATININE 0.69 (L) 12/04/2023   BUN 9 12/04/2023   NA 140 12/04/2023   K 4.4 12/04/2023   CL 102 12/04/2023   CO2 22 12/04/2023   Lab Results  Component Value Date   ALT 22 12/04/2023   AST 19 12/04/2023   ALKPHOS 97 12/04/2023   BILITOT 0.5 12/04/2023   Lab Results  Component Value Date   HGBA1C 7.0 12/11/2023   HGBA1C 6.7 (H) 07/09/2023   HGBA1C 7.6 (H) 11/19/2022   HGBA1C 7.6 (H) 09/03/2022   HGBA1C 6.1 01/23/2021   Lab Results  Component Value Date   INSULIN  44.7 (H) 01/01/2024   INSULIN  46.9 (H) 07/09/2023   INSULIN  43.8 (H) 11/19/2022   Lab Results  Component Value Date   TSH 3.580 11/19/2022   Lab Results  Component Value Date   CHOL 194 12/04/2023   HDL 45 12/04/2023   LDLCALC 127 (H) 12/04/2023   TRIG 123 12/04/2023   CHOLHDL 4.3 12/04/2023   Lab Results  Component Value Date   VD25OH 26.0 (L) 01/01/2024   VD25OH 21.1 (L) 07/09/2023   VD25OH 18.8 (L) 07/09/2023   Lab Results  Component Value Date   WBC 16.7 (H) 12/04/2023   HGB 14.3 12/04/2023   HCT 44.9 12/04/2023   MCV 86 12/04/2023   PLT 408 12/04/2023   No results found for: IRON, TIBC, FERRITIN  Attestation Statements:   Reviewed by  clinician on day of visit: allergies, medications, problem list, medical history, surgical history, family history, social history, and previous encounter notes.  I have reviewed the above documentation for accuracy and completeness, and I agree with the above. -  Sehar Sedano d. Kaleigh Spiegelman, NP-C

## 2024-02-13 ENCOUNTER — Inpatient Hospital Stay: Payer: MEDICAID | Admitting: Oncology

## 2024-02-13 DIAGNOSIS — R634 Abnormal weight loss: Secondary | ICD-10-CM

## 2024-02-13 DIAGNOSIS — D75839 Thrombocytosis, unspecified: Secondary | ICD-10-CM

## 2024-02-13 DIAGNOSIS — D72829 Elevated white blood cell count, unspecified: Secondary | ICD-10-CM

## 2024-02-13 NOTE — Progress Notes (Signed)
 [facility] OFFICE PROGRESS NOTE  Kayla Jeoffrey RAMAN, FNP  I connected with Allen Harding on 02/13/24 at  2:30 PM EDT by telephone visit and verified that I am speaking with the correct person using two identifiers.   I discussed the limitations, risks, security and privacy concerns of performing an evaluation and management service by telemedicine and the availability of in-person appointments. I also discussed with the patient that there may be a patient responsible charge related to this service. The patient expressed understanding and agreed to proceed.   Other persons participating in the visit and their role in the encounter: NP, Patient   Patient's location: Home  Provider's location: Clinic   ASSESSMENT & PLAN:    Assessment & Plan Leukocytosis, unspecified type - He does not have any B symptoms. - No infections in the last 1 year.  - Reviewed labs from 01/01/2024 shows a white blood cell count of 16.7 (16.4), hemoglobin 14.3 with a normal platelet count.  ANC is 11.3 (10.5) with an elevated absolute lymphocyte count.  LDH is normal. -Slight decrease in white blood cell count from previous.  Recommend continuing to monitor at this time.  Will hold off on bone marrow unless significant deviation from previous labs. -Recommend follow-up in 6 months.  Thrombocytosis - Platelet count has been intermittently elevated since 2018. -Platelet has ranged from normal to as high as 467.  Most recent platelet count is 408. -Continue to monitor. Weight loss - On Ozempic  and has lost greater than 45 pounds.  Orders Placed This Encounter  Procedures   CBC with Differential    Standing Status:   Future    Expected Date:   08/12/2024    Expiration Date:   02/12/2025   Lactate dehydrogenase    Standing Status:   Future    Expected Date:   08/12/2024    Expiration Date:   02/12/2025    INTERVAL HISTORY: Allen Harding, a 28 y.o. male, seen for follow-up of leukocytosis.   Denies any fevers, night sweats or unintentional weight loss in the last 1 year.  Energy levels are 70%.  Appetite 60%.     Patient was last seen on 04/24/2023.  Since his last visit, he denies any hospitalizations, surgeries or changes to his baseline health.   Denies any recurrent infections systemic steroid use.  He is back on Ozempic  and is down about 45 pounds.  Reports stress has improved over the past 3 to 4 months.  He is on disability and has limited income.  Denies any recent infections.  No new medications.  Reports a heat rash that appears on his skin intermittently rotating in location.  Occasionally it is itchy but is not painful.  Reports it is because he sweats a lot he is hoping this will improve since the weather is changing.  We reviewed CBC, CMP and LDH.  SUMMARY OF HEMATOLOGIC HISTORY: Oncology History   No history exists.    1.  Neutrophilic leukocytosis: -Previously tested for BCR/ABL and Jak 2 V6 58F mutation and was negative. -He is a non-smoker. -Denies any skin infections, dental infections.  Denies any steroid use.  Denies any history of splenectomy. -Denies any fevers, night sweats or weight loss. -Ultrasound of the abdomen on 03/24/2017 showed normal-sized spleen.  Hepatic steatosis.   2.  Thrombocytosis: -He has history of mildly elevated platelet count for many years. CBC    Component Value Date/Time   WBC 16.7 (H) 12/04/2023  0842   WBC 16.4 (H) 08/01/2023 1024   RBC 5.23 12/04/2023 0842   RBC 5.05 08/01/2023 1024   HGB 14.3 12/04/2023 0842   HCT 44.9 12/04/2023 0842   PLT 408 12/04/2023 0842   MCV 86 12/04/2023 0842   MCH 27.3 12/04/2023 0842   MCH 27.1 08/01/2023 1024   MCHC 31.8 12/04/2023 0842   MCHC 32.5 08/01/2023 1024   RDW 14.1 12/04/2023 0842   LYMPHSABS 4.1 (H) 12/04/2023 0842   MONOABS 0.8 08/01/2023 1024   EOSABS 0.5 (H) 12/04/2023 0842   BASOSABS 0.1 12/04/2023 0842       Latest Ref Rng & Units 12/04/2023    8:42 AM 07/09/2023    11:08 AM 11/19/2022   10:19 AM  CMP  Glucose 70 - 99 mg/dL 874  891    892  879   BUN 6 - 20 mg/dL 9  9    10  19    Creatinine 0.76 - 1.27 mg/dL 9.30  9.25    9.24  9.25   Sodium 134 - 144 mmol/L 140  140    139  139   Potassium 3.5 - 5.2 mmol/L 4.4  3.7    3.7  3.6   Chloride 96 - 106 mmol/L 102  101    99  96   CO2 20 - 29 mmol/L 22  24    21  25    Calcium  8.7 - 10.2 mg/dL 8.8  8.7    8.8  9.1   Total Protein 6.0 - 8.5 g/dL 7.0  7.2  7.0   Total Bilirubin 0.0 - 1.2 mg/dL 0.5  0.4  0.3   Alkaline Phos 44 - 121 IU/L 97  103  93   AST 0 - 40 IU/L 19  20  17    ALT 0 - 44 IU/L 22  24  23       Lab Results  Component Value Date   VITAMINB12 460 01/01/2024    There were no vitals filed for this visit.  Review of System:  Review of Systems  Constitutional:  Positive for malaise/fatigue and weight loss.  Skin:  Positive for itching and rash.  Psychiatric/Behavioral:  The patient is nervous/anxious.     Physical Exam: Physical Exam Neurological:     Mental Status: He is alert and oriented to person, place, and time.     I provided 16 minutes of non face-to-face telephone visit time during this encounter, and > 50% was spent counseling as documented under my assessment & plan.   Delon Hope, NP 02/13/2024 2:03 PM

## 2024-02-13 NOTE — Assessment & Plan Note (Addendum)
-   On Ozempic  and has lost greater than 45 pounds.

## 2024-02-13 NOTE — Assessment & Plan Note (Addendum)
-   He does not have any B symptoms. - No infections in the last 1 year.  - Reviewed labs from 01/01/2024 shows a white blood cell count of 16.7 (16.4), hemoglobin 14.3 with a normal platelet count.  ANC is 11.3 (10.5) with an elevated absolute lymphocyte count.  LDH is normal. -Slight decrease in white blood cell count from previous.  Recommend continuing to monitor at this time.  Will hold off on bone marrow unless significant deviation from previous labs. -Recommend follow-up in 6 months.

## 2024-02-13 NOTE — Assessment & Plan Note (Addendum)
-   Platelet count has been intermittently elevated since 2018. -Platelet has ranged from normal to as high as 467.  Most recent platelet count is 408. -Continue to monitor.

## 2024-03-09 ENCOUNTER — Encounter (INDEPENDENT_AMBULATORY_CARE_PROVIDER_SITE_OTHER): Payer: Self-pay | Admitting: Adult Health

## 2024-03-09 ENCOUNTER — Ambulatory Visit (INDEPENDENT_AMBULATORY_CARE_PROVIDER_SITE_OTHER): Payer: MEDICAID | Admitting: Adult Health

## 2024-03-09 VITALS — BP 135/89 | HR 109 | Temp 98.3°F | Ht 70.0 in | Wt 395.0 lb

## 2024-03-09 DIAGNOSIS — E669 Obesity, unspecified: Secondary | ICD-10-CM

## 2024-03-09 DIAGNOSIS — F909 Attention-deficit hyperactivity disorder, unspecified type: Secondary | ICD-10-CM

## 2024-03-09 DIAGNOSIS — Z6841 Body Mass Index (BMI) 40.0 and over, adult: Secondary | ICD-10-CM

## 2024-03-09 DIAGNOSIS — E1159 Type 2 diabetes mellitus with other circulatory complications: Secondary | ICD-10-CM | POA: Diagnosis not present

## 2024-03-09 DIAGNOSIS — I152 Hypertension secondary to endocrine disorders: Secondary | ICD-10-CM

## 2024-03-09 DIAGNOSIS — E1169 Type 2 diabetes mellitus with other specified complication: Secondary | ICD-10-CM

## 2024-03-09 DIAGNOSIS — E559 Vitamin D deficiency, unspecified: Secondary | ICD-10-CM | POA: Diagnosis not present

## 2024-03-09 DIAGNOSIS — Z7985 Long-term (current) use of injectable non-insulin antidiabetic drugs: Secondary | ICD-10-CM

## 2024-03-09 DIAGNOSIS — Z7984 Long term (current) use of oral hypoglycemic drugs: Secondary | ICD-10-CM

## 2024-03-09 MED ORDER — VITAMIN D (ERGOCALCIFEROL) 1.25 MG (50000 UNIT) PO CAPS: 50000.0000 [IU] | ORAL_CAPSULE | ORAL | 0 refills | Status: DC

## 2024-03-09 NOTE — Progress Notes (Signed)
 WEIGHT SUMMARY AND BIOMETRICS  Vitals Temp: 98.3 F (36.8 C) BP: 135/89 Pulse Rate: (!) 109 SpO2: 97 %   Anthropometric Measurements Height: 5' 10 (1.778 m) Weight: (!) 395 lb (179.2 kg) BMI (Calculated): 56.68 Weight at Last Visit: 406lb Weight Lost Since Last Visit: 11lb00lb Starting Weight: 441lb Total Weight Loss (lbs): 46 lb (20.9 kg)   Body Composition  Body Fat %: 45.3 % Fat Mass (lbs): 179.2 lbs Muscle Mass (lbs): 206 lbs Total Body Water (lbs): 148 lbs Visceral Fat Rating : 33   Other Clinical Data Fasting: No Labs: No Today's Visit #: 15 Starting Date: 11/19/22    Chief Complaint:   OBESITY Allen Harding is here to discuss his progress with his obesity treatment plan.  He is on the the Category 4 Plan and states he is following his eating plan approximately 75 % of the time.  He states he is exercising: [employer] Activities  Interim History:  He is looked forward to attending his friend's parents wedding celebration next Monday 03/15/2024  Hunger/appetite-stable appetite. He administers weekly Ozempic  2mg  on Friday. He will experience loose stools Friday evening, denies any other SE  Reviewed Bioimpedance Results with pt: Muscle Mass:- 2.2 lbs Adipose Mass: -8.8 lbs   Subjective:   1. Vitamin D  deficiency  Latest Reference Range & Units 07/09/23 11:07 07/09/23 11:08 01/01/24 12:22  Vitamin D , 25-Hydroxy 30.0 - 100.0 ng/mL 18.8 (L) 21.1 (L) 26.0 (L)  (L): Data is abnormally low  Level is slowly improving He is on Ergocalciferol , 1 cap twice weekly  2. Type 2 diabetes mellitus with morbid obesity [facility]) Endocrinology managesdaily Metformin  500mg  and max dose weekly Ozempic  2mg  He administers weekly Ozempic  2mg  on Friday. He will experience loose stools Friday evening, denies any other SE He is not checking home CBG He denies sx's of hypoglycemia  3. Hypertension associated with type 2 diabetes mellitus (HCC) BP stable at OV He reports now  consistently taking daily antihypertensives He is on metFORMIN  (GLUCOPHAGE ) 500 MG tablet  metoprolol  succinate (TOPROL -XL) 100 MG 24 hr tablet  rosuvastatin  (CRESTOR ) 10 MG tablet  Olmesartan -amLODIPine -HCTZ (TRIBENZOR) 40-10-25 MG TABS  Semaglutide , 2 MG/DOSE, 8 MG/3ML SOPN  hydrALAZINE  (APRESOLINE ) 25 MG tablet   Assessment/Plan:   1. Vitamin D  deficiency (Primary) Refill Vitamin D , Ergocalciferol , (DRISDOL ) 1.25 MG (50000 UNIT) CAPS capsule Take 1 capsule (50,000 Units total) by mouth every 3 (three) days. Dispense: 12 capsule, Refills: 0 ordered   2. Type 2 diabetes mellitus with morbid obesity (HCC) Limit simple CHO in foods and drinks Continue to increase lean protein intake Do not exercise until cleared by Cards  3. Hypertension associated with type 2 diabetes mellitus (HCC) Limit simple CHO in foods and drinks Continue to increase lean protein intake Do not exercise until cleared by Cards Continue  metFORMIN  (GLUCOPHAGE ) 500 MG tablet  metoprolol  succinate (TOPROL -XL) 100 MG 24 hr tablet  rosuvastatin  (CRESTOR ) 10 MG tablet  Olmesartan -amLODIPine -HCTZ (TRIBENZOR) 40-10-25 MG TABS  Semaglutide , 2 MG/DOSE, 8 MG/3ML SOPN  hydrALAZINE  (APRESOLINE ) 25 MG tablet   5. BMI 60.0-69.9, adult (HCC)- current BMI 56.8  Allen Harding is currently in the action stage of change. As such, his goal is to continue with weight loss efforts. He has agreed to the Category 4 Plan.   Exercise goals: No exercise has been prescribed at this time.  Behavioral modification strategies: increasing lean protein intake, decreasing simple carbohydrates, increasing vegetables, increasing water intake, no skipping meals, meal planning and cooking strategies, keeping healthy foods  in the home, ways to avoid boredom eating, and planning for success.  [name] has agreed to follow-up with our clinic in 4 weeks. He was informed of the importance of frequent follow-up visits to maximize his success with intensive  lifestyle modifications for his multiple health conditions.   Check Fasting Labs Fall 2025  Objective:   Blood pressure 135/89, pulse (!) 109, temperature 98.3 F (36.8 C), height 5' 10 (1.778 m), weight (!) 395 lb (179.2 kg), SpO2 97%. Body mass index is 56.68 kg/m.  General: Cooperative, alert, well developed, in no acute distress. HEENT: Conjunctivae and lids unremarkable. Cardiovascular: Regular rhythm.  Lungs: Normal work of breathing. Neurologic: No focal deficits.   Lab Results  Component Value Date   CREATININE 0.69 (L) 12/04/2023   BUN 9 12/04/2023   NA 140 12/04/2023   K 4.4 12/04/2023   CL 102 12/04/2023   CO2 22 12/04/2023   Lab Results  Component Value Date   ALT 22 12/04/2023   AST 19 12/04/2023   ALKPHOS 97 12/04/2023   BILITOT 0.5 12/04/2023   Lab Results  Component Value Date   HGBA1C 7.0 12/11/2023   HGBA1C 6.7 (H) 07/09/2023   HGBA1C 7.6 (H) 11/19/2022   HGBA1C 7.6 (H) 09/03/2022   HGBA1C 6.1 01/23/2021   Lab Results  Component Value Date   INSULIN  44.7 (H) 01/01/2024   INSULIN  46.9 (H) 07/09/2023   INSULIN  43.8 (H) 11/19/2022   Lab Results  Component Value Date   TSH 3.580 11/19/2022   Lab Results  Component Value Date   CHOL 194 12/04/2023   HDL 45 12/04/2023   LDLCALC 127 (H) 12/04/2023   TRIG 123 12/04/2023   CHOLHDL 4.3 12/04/2023   Lab Results  Component Value Date   VD25OH 26.0 (L) 01/01/2024   VD25OH 21.1 (L) 07/09/2023   VD25OH 18.8 (L) 07/09/2023   Lab Results  Component Value Date   WBC 16.7 (H) 12/04/2023   HGB 14.3 12/04/2023   HCT 44.9 12/04/2023   MCV 86 12/04/2023   PLT 408 12/04/2023   No results found for: IRON, TIBC, FERRITIN  Attestation Statements:   Reviewed by clinician on day of visit: allergies, medications, problem list, medical history, surgical history, family history, social history, and previous encounter notes.  I have reviewed the above documentation for accuracy and completeness,  and I agree with the above. -  Hao Dion d. Senie Lanese, NP-C

## 2024-03-12 ENCOUNTER — Ambulatory Visit: Payer: MEDICAID | Admitting: Pharmacist

## 2024-03-12 VITALS — BP 124/92 | HR 111

## 2024-03-12 DIAGNOSIS — I1A0 Resistant hypertension: Secondary | ICD-10-CM | POA: Diagnosis present

## 2024-03-12 NOTE — Patient Instructions (Addendum)
 It was good seeing you again  We would like your blood pressure to be less than 130/80.  It is improved today. The diastolic reading (the second reading) is still a little high  Please continue your: Metoprolol  100mg  daily Olmesartan /amlodipine /hydrochlorothiazide 40/10/25 Hydralazine  25mg . Try to increase to twice daily  Continue to watch how much salt and sugar you are eating   I have sent a message to Dr Skeeter staff to schedule you for a follow up   Allen Harding, PharmD, BCACP, CDCES, CPP [facility] [address], [place], [place] [address] Phone: [phone]; Fax: [phone] 03/12/2024 9:39 AM

## 2024-03-12 NOTE — Progress Notes (Signed)
 Patient ID: Allen Harding                 DOB: 07-Jul-1995                      MRN: [id number]     HPI: Allen Harding is a 28 y.o. male referred by Dr. Raford to HTN clinic. PMH is significant for HTN, T2DM, hypogonadism, obesity, HLD, autism and ADHD.  Lives with mother who manages his medications. Has a BP ciff at home but has not checked home BP. Has also not been monitoring BG.  Currently on Tribenzor and Toprol  100mg  daily. Reports no adverse effects. Reports compliance but is interested in prepackaged meds. Hydralazine  added at last visit.  Presents today with mother. Reports he has stopped drinking as many sports drinks due to salt content. Now drinking more juices. Currently on high protein diet as prescribed by [facility]. His mother cooks his meals. Now on Ozempic  2mg  weekly.  Has only been taking hydralazine  in the morning. Would like to start lifting weights.   Current HTN meds:  Toprol  100mg  daily Olemsartan/amlodipine /hydrochlorothiazide 40/10/25 Hydralazine  25mg  (only taking once dauiy  BP goal: <130/80   Wt Readings from Last 3 Encounters:  08/13/23 (!) 411 lb (186.4 kg)  08/07/23 (!) 416 lb 14.4 oz (189.1 kg)  07/21/23 (!) 416 lb (188.7 kg)   BP Readings from Last 3 Encounters:  08/13/23 129/88  08/07/23 (!) 183/125  07/21/23 (!) 144/98   Pulse Readings from Last 3 Encounters:  08/13/23 (!) 109  08/07/23 (!) 118  07/21/23 92    Renal function: CrCl cannot be calculated (Patient's most recent lab result is older than the maximum 21 days allowed.).  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 DX: E11.65 200 each 5   Blood Glucose Monitoring Suppl (ACCU-CHEK GUIDE) w/Device KIT Use as directed to check glucose daily. DX: E11.65 1 kit 1   Blood Pressure Monitoring KIT 1 Units by Does not apply route daily. 1 kit 0   glucose blood (ACCU-CHEK GUIDE) test strip Use as directed to check glucose daily. DX: E11.65 200 each 5   metFORMIN  (GLUCOPHAGE ) 500 MG tablet Take 1 tablet (500 mg total) by mouth daily with breakfast. 90 tablet 0   metoprolol  succinate (TOPROL -XL) 100 MG 24 hr tablet Take 1 tablet (100 mg total) by mouth daily. Take with or immediately following a meal. 90 tablet 3   Multiple Vitamin (MULTIVITAMIN) tablet Take 1 tablet by mouth daily.     Olmesartan -amLODIPine -HCTZ (TRIBENZOR) 40-5-25 MG TABS Take 1 tablet by mouth daily. 90 tablet 3   oxybutynin  (DITROPAN -XL) 10 MG 24 hr tablet TAKE 1 TABLET BY MOUTH DAILY. 30 tablet 3   rosuvastatin  (CRESTOR ) 10 MG tablet Take 0.5 tablets (5 mg total) by mouth daily. 90 tablet 1  Semaglutide , 1 MG/DOSE, 4 MG/3ML SOPN Inject 1 mg as directed once a week. 6 mL 1   SYRINGE-NEEDLE, DISP, 3 ML 21G X 1-1/2 3 ML MISC Use to inject testosterone  every week 50 each 1   testosterone  cypionate (DEPO-TESTOSTERONE ) 200 MG/ML injection Inject 0.5 mLs (100 mg total) into the muscle every 14 (fourteen) days. 10 mL 0   Vitamin D , Ergocalciferol , (DRISDOL ) 1.25 MG (50000 UNIT) CAPS capsule Take 1 capsule (50,000 Units total) by mouth every 3 (three) days. 12 capsule 0   No current facility-administered medications on file prior to visit.    No Known Allergies   Assessment/Plan:  1. Hypertension -  Patient's BP in room today has improved to 124/92. Reducing sodium intake likely contributory.  Diastolic BP remains elevated but has also improved.  Advised he  could begin lifting weights and to continue to follow up with healthy weight and wellness. Recommended increasing hydralazine  25mg  to at least twice a day if he can not remember to take three times daily.  Needs yearly follow up with cardiologist scheduled.  Continue Metoprolol  succinate 100mg  daily Continue amlodipine /hydrochlorothiazide/olmesartan  03/28/39 Continue hydralazine  25mg  tID Recheck as needed  Chris Edwinna Rochette, PharmD, BCACP, CDCES, CPP [facility] [address] [place], [place] [address] Phone: [phone]; Fax: [phone] 12/26/2023 12:13 PM

## 2024-03-15 ENCOUNTER — Encounter: Payer: Self-pay | Admitting: Pharmacist

## 2024-03-17 ENCOUNTER — Other Ambulatory Visit (HOSPITAL_COMMUNITY): Payer: Self-pay

## 2024-03-17 ENCOUNTER — Telehealth: Payer: Self-pay

## 2024-03-17 NOTE — Telephone Encounter (Signed)
 Pharmacy Patient Advocate Encounter   Received notification from CoverMyMeds that prior authorization for Ozempic  (2 MG/DOSE) 8MG /3ML pen-injectors is required/requested.   Insurance verification completed.   The patient is insured through [employer].   Per test claim: PA required; PA submitted to above mentioned insurance via Latent Key/confirmation #/EOC [facility] Status is pending

## 2024-03-23 NOTE — Telephone Encounter (Signed)
 Pharmacy Patient Advocate Encounter  Received notification from [facility] that Prior Authorization for Ozempic  (2 MG/DOSE) 8MG /3ML pen-injectors  has been APPROVED from 03-18-2024 to 03-18-2025   PA #/Case ID/Reference #: [facility]

## 2024-03-23 NOTE — Telephone Encounter (Signed)
 Pt's legal guardian Wanda Cellucci) made aware.

## 2024-04-06 ENCOUNTER — Encounter (INDEPENDENT_AMBULATORY_CARE_PROVIDER_SITE_OTHER): Payer: Self-pay | Admitting: Adult Health

## 2024-04-06 ENCOUNTER — Ambulatory Visit (INDEPENDENT_AMBULATORY_CARE_PROVIDER_SITE_OTHER): Payer: MEDICAID | Admitting: Adult Health

## 2024-04-06 DIAGNOSIS — E1169 Type 2 diabetes mellitus with other specified complication: Secondary | ICD-10-CM | POA: Diagnosis not present

## 2024-04-06 DIAGNOSIS — I1A0 Resistant hypertension: Secondary | ICD-10-CM

## 2024-04-06 DIAGNOSIS — Z7984 Long term (current) use of oral hypoglycemic drugs: Secondary | ICD-10-CM

## 2024-04-06 DIAGNOSIS — I152 Hypertension secondary to endocrine disorders: Secondary | ICD-10-CM

## 2024-04-06 DIAGNOSIS — Z7985 Long-term (current) use of injectable non-insulin antidiabetic drugs: Secondary | ICD-10-CM

## 2024-04-06 DIAGNOSIS — Z6841 Body Mass Index (BMI) 40.0 and over, adult: Secondary | ICD-10-CM

## 2024-04-06 DIAGNOSIS — E559 Vitamin D deficiency, unspecified: Secondary | ICD-10-CM

## 2024-04-06 MED ORDER — VITAMIN D (ERGOCALCIFEROL) 1.25 MG (50000 UNIT) PO CAPS: 50000.0000 [IU] | ORAL_CAPSULE | ORAL | 0 refills | Status: DC

## 2024-04-06 NOTE — Progress Notes (Signed)
 WEIGHT SUMMARY AND BIOMETRICS  Vitals BP: 133/82   Anthropometric Measurements Height: 5' 10 (1.778 m) Weight: (!) 397 lb (180.1 kg) BMI (Calculated): 56.96 Weight at Last Visit: 395lb Weight Lost Since Last Visit: 0lb Weight Gained Since Last Visit: 2lb Starting Weight: 441lb Total Weight Loss (lbs): 44 lb (20 kg)   Body Composition  Body Fat %: 46.1 % Fat Mass (lbs): 183 lbs Muscle Mass (lbs): 203.6 lbs Total Body Water (lbs): 151 lbs Visceral Fat Rating : 33   Other Clinical Data Fasting: No Labs: No Today's Visit #: 16 Starting Date: 11/19/22    Chief Complaint:   OBESITY Allen Harding is here to discuss his progress with his obesity treatment plan.  He is on the the Category 4 Plan and states he is following his eating plan approximately 90 % of the time.  He states he is exercising Walking 30 minutes 2 times per week.  Interim History:  His maternal great uncle passed away 2 weeks ago.   [name] endorses stable mood.  He is accompanied by his mother at OV today.  He reports drinking more high caloric/sugary drinks: Snapple, Seasonal Sprite with cranberry juice   Reviewed Bioimpedance Results with pt: Muscle Mass: -2.4 lbs Adipose Mass: +3.8 lbs  Discussed the importance of adequate nutrition and regular exercise- this can maintain/increase muscle mass.  Subjective:   1. Type 2 diabetes mellitus with morbid obesity (HCC) He administers weekly Ozempic  2mg  Friday- will experienced diarrhea Friday evening through Saturday morning. He endorses low appetite, will only one meal in late evening most days of the week. He has not been checking home CBG He reports only one episode of symptomatic hypoglycemia- fatigue and diaphoresis. That day- he had not eaten > 8 hrs He consumed dinner and sx's resolved He is currently on: metFORMIN  (GLUCOPHAGE ) 500 MG tablet  metoprolol  succinate (TOPROL -XL) 100 MG 24 hr tablet  rosuvastatin  (CRESTOR ) 10 MG tablet   Olmesartan -amLODIPine -HCTZ (TRIBENZOR) 40-10-25 MG TABS  Semaglutide , 2 MG/DOSE, 8 MG/3ML SOPN  hydrALAZINE  (APRESOLINE ) 25 MG tablet   2. Hypertension associated with type 2 diabetes mellitus (HCC) He rarely checks home BP, when he does check he estimates that his SBP: 130-140s DBP: 70-90s He reports improved compliance with daily antihypertensive therapy He is on metFORMIN  (GLUCOPHAGE ) 500 MG tablet  metoprolol  succinate (TOPROL -XL) 100 MG 24 hr tablet  rosuvastatin  (CRESTOR ) 10 MG tablet  Olmesartan -amLODIPine -HCTZ (TRIBENZOR) 40-10-25 MG TABS  Semaglutide , 2 MG/DOSE, 8 MG/3ML SOPN  hydrALAZINE  (APRESOLINE ) 25 MG tablet   3. Vitamin D  deficiency  Latest Reference Range & Units 07/09/23 11:07 07/09/23 11:08 01/01/24 12:22  Vitamin D , 25-Hydroxy 30.0 - 100.0 ng/mL 18.8 (L) 21.1 (L) 26.0 (L)  (L): Data is abnormally low  He is on twice weekly Ergocalciferol - denies N/V/Muscle Weakness  Assessment/Plan:   1. Type 2 diabetes mellitus with morbid obesity (HCC) Recommend checking fasting CBG several mornings each week. Recommend consuming 20-30g protein at least 3 times daily. F/u with established Endocrinologist as directed.  2. Hypertension associated with type 2 diabetes mellitus (HCC) Limit Na+ intake Try to hydrate with water and less soda or juice.  3. Vitamin D  deficiency Refill  Vitamin D , Ergocalciferol , (DRISDOL ) 1.25 MG (50000 UNIT) CAPS capsule Take 1 capsule (50,000 Units total) by mouth every 3 (three) days. Dispense: 12 capsule, Refills: 0 ordered   4. BMI 60.0-69.9, adult (HCC)- current BMI 57.0  [name] is currently in the action stage of change. As such, his goal is to  continue with weight loss efforts. He has agreed to the Category 4 Plan.   Exercise goals: All adults should avoid inactivity. Some physical activity is better than none, and adults who participate in any amount of physical activity gain some health benefits. Adults should also include  muscle-strengthening activities that involve all major muscle groups on 2 or more days a week. Add in light strength training at least 1 x week.  Behavioral modification strategies: increasing lean protein intake, decreasing simple carbohydrates, increasing vegetables, increasing water intake, no skipping meals, meal planning and cooking strategies, keeping healthy foods in the home, ways to avoid boredom eating, and planning for success.  Aven has agreed to follow-up with our clinic in 4 weeks. He was informed of the importance of frequent follow-up visits to maximize his success with intensive lifestyle modifications for his multiple health conditions.   Objective:   Blood pressure 133/82, height 5' 10 (1.778 m), weight (!) 397 lb (180.1 kg). Body mass index is 56.96 kg/m.  General: Cooperative, alert, well developed, in no acute distress. HEENT: Conjunctivae and lids unremarkable. Cardiovascular: Regular rhythm.  Lungs: Normal work of breathing. Neurologic: No focal deficits.   Lab Results  Component Value Date   CREATININE 0.69 (L) 12/04/2023   BUN 9 12/04/2023   NA 140 12/04/2023   K 4.4 12/04/2023   CL 102 12/04/2023   CO2 22 12/04/2023   Lab Results  Component Value Date   ALT 22 12/04/2023   AST 19 12/04/2023   ALKPHOS 97 12/04/2023   BILITOT 0.5 12/04/2023   Lab Results  Component Value Date   HGBA1C 7.0 12/11/2023   HGBA1C 6.7 (H) 07/09/2023   HGBA1C 7.6 (H) 11/19/2022   HGBA1C 7.6 (H) 09/03/2022   HGBA1C 6.1 01/23/2021   Lab Results  Component Value Date   INSULIN  44.7 (H) 01/01/2024   INSULIN  46.9 (H) 07/09/2023   INSULIN  43.8 (H) 11/19/2022   Lab Results  Component Value Date   TSH 3.580 11/19/2022   Lab Results  Component Value Date   CHOL 194 12/04/2023   HDL 45 12/04/2023   LDLCALC 127 (H) 12/04/2023   TRIG 123 12/04/2023   CHOLHDL 4.3 12/04/2023   Lab Results  Component Value Date   VD25OH 26.0 (L) 01/01/2024   VD25OH 21.1 (L)  07/09/2023   VD25OH 18.8 (L) 07/09/2023   Lab Results  Component Value Date   WBC 16.7 (H) 12/04/2023   HGB 14.3 12/04/2023   HCT 44.9 12/04/2023   MCV 86 12/04/2023   PLT 408 12/04/2023   No results found for: IRON, TIBC, FERRITIN  Attestation Statements:   Reviewed by clinician on day of visit: allergies, medications, problem list, medical history, surgical history, family history, social history, and previous encounter notes.  I have reviewed the above documentation for accuracy and completeness, and I agree with the above. -  Keyandre Pileggi d. Marian Grandt, NP-C

## 2024-04-12 ENCOUNTER — Ambulatory Visit: Payer: MEDICAID | Admitting: "Endocrinology

## 2024-04-28 LAB — TESTOSTERONE, FREE, TOTAL, SHBG
Sex Hormone Binding: 17.3 nmol/L (ref 16.5–55.9)
Testosterone, Free: 11.7 pg/mL (ref 9.3–26.5)
Testosterone: 261 ng/dL — ABNORMAL LOW (ref 264–916)

## 2024-05-06 ENCOUNTER — Ambulatory Visit: Payer: MEDICAID | Admitting: "Endocrinology

## 2024-05-06 ENCOUNTER — Encounter: Payer: Self-pay | Admitting: "Endocrinology

## 2024-05-06 VITALS — BP 140/80 | HR 110 | Resp 18 | Ht 70.0 in | Wt >= 6400 oz

## 2024-05-06 DIAGNOSIS — Z7985 Long-term (current) use of injectable non-insulin antidiabetic drugs: Secondary | ICD-10-CM | POA: Diagnosis not present

## 2024-05-06 DIAGNOSIS — E291 Testicular hypofunction: Secondary | ICD-10-CM | POA: Diagnosis not present

## 2024-05-06 DIAGNOSIS — E119 Type 2 diabetes mellitus without complications: Secondary | ICD-10-CM | POA: Diagnosis not present

## 2024-05-06 DIAGNOSIS — E782 Mixed hyperlipidemia: Secondary | ICD-10-CM

## 2024-05-06 DIAGNOSIS — Z7984 Long term (current) use of oral hypoglycemic drugs: Secondary | ICD-10-CM

## 2024-05-06 DIAGNOSIS — I10 Essential (primary) hypertension: Secondary | ICD-10-CM

## 2024-05-06 LAB — POCT GLYCOSYLATED HEMOGLOBIN (HGB A1C): Hemoglobin A1C: 6.1 % — AB (ref 4.0–5.6)

## 2024-05-06 MED ORDER — TIRZEPATIDE 2.5 MG/0.5ML ~~LOC~~ SOAJ: 2.5000 mg | SUBCUTANEOUS | 0 refills | Status: DC

## 2024-05-06 NOTE — Progress Notes (Signed)
 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   SOB (shortness of breath) on exertion    Trouble in sleeping    Weakness    Past Surgical History:  Procedure Laterality Date   CIRCUMCISION     CIRCUMCISION REVISION     FRENULECTOMY, LINGUAL     lipoma     ORCHIOPEXY     Social History   Socioeconomic History   Marital status: Single    Spouse name: Not on file   Number of children: Not on file   Years of education: Not on file   Highest education level: 12th grade  Occupational History   Not on file  Tobacco Use   Smoking status: Former    Current packs/day: 0.00    Types: Cigarettes    Quit date: 01/09/2014    Years since quitting: 10.3   Smokeless tobacco: Never  Vaping Use   Vaping status: Never Used  Substance and Sexual Activity   Alcohol use: Yes    Comment: socially   Drug use: No   Sexual activity: Not on file  Other Topics Concern   Not on file  Social History Narrative   Lives with mom, sister, 2 nieces, grandparents and sister's fiance.    Social Drivers of Corporate Investment Banker Strain: Low Risk  (01/08/2023)   Overall Financial Resource Strain (CARDIA)    Difficulty of Paying Living Expenses: Not hard at all  Food Insecurity: No Food Insecurity (06/25/2023)   Hunger Vital Sign    Worried About Running Out of Food in the Last Year: Never true    Ran Out of Food in the Last Year: Never true  Transportation Needs: No Transportation Needs (06/25/2023)   PRAPARE - Administrator, Civil Service (Medical): No     Lack of Transportation (Non-Medical): No  Physical Activity: Insufficiently Active (06/25/2023)   Exercise Vital Sign    Days of Exercise per Week: 3 days    Minutes of Exercise per Session: 30 min  Stress: No Stress Concern Present (03/24/2023)   [employer] of Occupational Health - Occupational Stress Questionnaire    Feeling of Stress : Only a little  Social Connections: Moderately Integrated (03/24/2023)   Social Connection and Isolation Panel    Frequency of Communication with Friends and Family: More than three times a week    Frequency of Social Gatherings with Friends and Family: More than three times a week    Attends Religious Services: More than 4 times per year    Active Member of [employer] or Organizations: Yes    Attends Banker Meetings: More than 4 times per year    Marital Status: Never married   Outpatient Encounter Medications as of 05/06/2024  Medication Sig   Accu-Chek Softclix Lancets lancets Use as directed to check glucose daily. DX: E11.65   Blood Pressure Monitoring KIT 1 Units by Does not apply route  daily.   hydrALAZINE  (APRESOLINE ) 25 MG tablet Take 1 tablet (25 mg total) by mouth 3 (three) times daily.   metFORMIN  (GLUCOPHAGE ) 500 MG tablet Take 1 tablet (500 mg total) by mouth daily with breakfast.   metoprolol  succinate (TOPROL -XL) 100 MG 24 hr tablet Take 1 tablet (100 mg total) by mouth daily. Take with or immediately following a meal.   Multiple Vitamin (MULTIVITAMIN) tablet Take 1 tablet by mouth daily.   Olmesartan -amLODIPine -HCTZ (TRIBENZOR) 40-10-25 MG TABS Take 1 tablet by mouth every morning.   oxybutynin  (DITROPAN -XL) 10 MG 24 hr tablet TAKE 1 TABLET BY MOUTH DAILY.   rosuvastatin  (CRESTOR ) 10 MG tablet Take 0.5 tablets (5 mg total) by mouth daily.   SYRINGE-NEEDLE, DISP, 3 ML 21G X 1-1/2 3 ML MISC Use to inject testosterone  every week   testosterone  cypionate (DEPO-TESTOSTERONE ) 200 MG/ML injection Inject 100 mg ( 1/2 ml every  10 days)   tirzepatide (MOUNJARO) 2.5 MG/0.5ML Pen Inject 2.5 mg into the skin once a week.   Vitamin D , Ergocalciferol , (DRISDOL ) 1.25 MG (50000 UNIT) CAPS capsule Take 1 capsule (50,000 Units total) by mouth every 3 (three) days.   [DISCONTINUED] Semaglutide , 2 MG/DOSE, 8 MG/3ML SOPN Inject 2 mg as directed once a week.   Blood Glucose Monitoring Suppl (ACCU-CHEK GUIDE) w/Device KIT Use as directed to check glucose daily. DX: E11.65 (Patient not taking: Reported on 05/06/2024)   glucose blood (ACCU-CHEK GUIDE) test strip Use as directed to check glucose daily. DX: E11.65 (Patient not taking: Reported on 05/06/2024)   No facility-administered encounter medications on file as of 05/06/2024.   ALLERGIES: No Known Allergies VACCINATION STATUS: Immunization History  Administered Date(s) Administered   DTaP 06/11/1995, 08/06/1995, 09/24/1995, 06/30/1996, 09/24/1999   H1N1 11/24/2008   HIB (PRP-OMP) 06/11/1995, 08/06/1995, 09/24/1995, 06/30/1996   HPV Quadrivalent 02/08/2014   Hepatitis A, Ped/Adol-2 Dose 02/03/2013, 08/19/2013   Hepatitis B 1994/12/18, 05/07/1995, 09/24/1995   IPV 06/11/1995, 08/06/1995, 09/24/1995, 09/24/1999   Influenza Whole 03/22/2009, 06/17/2011, 07/23/2012   Influenza, Seasonal, Injecte, Preservative Fre 03/26/2023   Influenza,inj,Quad PF,6+ Mos 05/02/2015, 03/30/2019, 03/30/2020   MMR 03/31/1996, 09/24/1999   Meningococcal Conjugate 11/24/2008, 08/19/2013   Td 01/07/2006   Tdap 01/07/2006, 03/26/2023   Varicella 03/31/1996, 06/01/1997    Diabetes   28 year old male patient with medical history as above. 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   -He was seen in this clinic until August 2022 with significant inconsistency in his follow-up .  See notes from previous visits.  - He has been following at [facility] until March 2017 with Dr.  Dorrene. -He does not know for sure when he was started on testosterone  therapy.   - After several months off of testosterone , he resumed prior to his last visit at a low dose.  He returns with total testosterone  of 261 mg per DL, improving from 63 .  He reports better consistency with his testosterone  currently 100 mg IM every 10 days. He wishes to continue on testosterone  replacement therapy.    He has type 2 diabetes on Ozempic  2 mg subcutaneously weekly.  He returns with progressive weight loss about 25 pounds since last visit.  His point-of-care A1c 6.1% improving from 7%.    He remains on metformin  500 mg p.o. once a day as well.   -He has dealt with heavy weight almost all of his life, admits to dietary indiscretions including consumption of large quantities of sweetened beverages.  -  He remains on Crestor  10 mg p.o. nightly for dyslipidemia.   -He underwent orchidopexy for right-sided undescended testes. -He denies testicular injury, radiation, infection, STD. -He denies any history of head injury.    Objective:    BP (!) 140/80   Pulse (!) 110   Resp 18   Ht 5' 10 (1.778 m)   Wt (!) 403 lb 3.2 oz (182.9 kg)   SpO2 98%   BMI 57.85 kg/m   Wt Readings from Last 3 Encounters:  05/06/24 (!) 403 lb 3.2 oz (182.9 kg)  04/06/24 (!) 397 lb (180.1 kg)  03/09/24 (!) 395 lb (179.2 kg)      CMP     Component Value Date/Time   NA 140 12/04/2023 0842   K 4.4 12/04/2023 0842   CL 102 12/04/2023 0842   CO2 22 12/04/2023 0842   GLUCOSE 125 (H) 12/04/2023 0842   GLUCOSE 149 (H) 09/03/2022 0850   BUN 9 12/04/2023 0842   CREATININE 0.69 (L) 12/04/2023 0842   CREATININE 0.65 09/03/2022 0850   CALCIUM  8.8 12/04/2023 0842   PROT 7.0 12/04/2023 0842   ALBUMIN 3.8 (L) 12/04/2023 0842   AST 19 12/04/2023 0842   ALT 22 12/04/2023 0842   ALKPHOS 97 12/04/2023 0842   BILITOT 0.5 12/04/2023 0842   GFRNONAA >60 03/22/2020 1320   GFRNONAA 138 01/26/2020 1047   GFRAA 160 01/26/2020 1047    Diabetic Labs (most recent): Lab Results  Component Value Date   HGBA1C 6.1 (A) 05/06/2024   HGBA1C 7.0 12/11/2023   HGBA1C 6.7 (H) 07/09/2023   MICROALBUR 2.7 08/26/2022   MICROALBUR 11.6 01/26/2020   MICROALBUR 17.4 08/21/2018    Lipid Panel     Component Value Date/Time   CHOL 194 12/04/2023 0842   TRIG 123 12/04/2023 0842   HDL 45 12/04/2023 0842   CHOLHDL 4.3 12/04/2023 0842   CHOLHDL 3.9 09/03/2022 0850   VLDL 21 08/08/2015 1152   LDLCALC 127 (H) 12/04/2023 0842   LDLCALC 130 (H) 09/03/2022 0850   Recent Results (from the past 2160 hours)  Testosterone , Free, Total, SHBG     Status: Abnormal   Collection Time: 04/26/24 12:23 PM  Result Value Ref Range   Testosterone  261 (L) 264 - 916 ng/dL    Comment: Adult male reference interval is based on a population of healthy nonobese males (BMI <30) between 59 and 33 years old. Travison, et.al. JCEM [id number]. PMID: 71675896.    Testosterone , Free 11.7 9.3 - 26.5 pg/mL   Sex Hormone Binding 17.3 16.5 - 55.9 nmol/L  POCT glycosylated hemoglobin (Hb A1C)     Status: Abnormal   Collection Time: 05/06/24 11:04 AM  Result Value Ref Range   Hemoglobin A1C 6.1 (A) 4.0 - 5.6 %   HbA1c POC (<> result, manual entry)     HbA1c, POC (prediabetic range)     HbA1c, POC (controlled diabetic range)       Assessment & Plan:   1.  Type 2 diabetes -His point-of-care A1c is 6.1% improving from 7%.  He is benefiting from GLP-1 receptor agonist.  May benefit more from Mounjaro.  I discussed initiating Mounjaro 2.5 mg subcutaneously weekly to advance as he tolerates.  If he does not get coverage for Mounjaro, he will be back on Ozempic  2 mg subcutaneously weekly.     Side effects and precautions discussed with him.   He will continue to benefit from low-dose metformin , 500 mg p.o. daily at breakfast. - he acknowledges that  there is a room for improvement in his food and drink choices. - Suggestion is made for him to avoid  simple carbohydrates  from his diet including Cakes, Sweet Desserts, Ice Cream, Soda (diet and regular), Sweet Tea, Candies, Chips, Cookies, Store Bought Juices, Alcohol , Artificial Sweeteners,  Coffee Creamer, and Sugar-free Products, Lemonade. This will help patient to have more stable blood glucose profile and potentially avoid unintended weight gain.  His current BMI is 57.85 kg/m dropping from 61.47  his major medical and cardiovascular risk. Whole food plant-based diet was briefly discussed-see above, did not engage optimally.  2. Hypogonadism  - Etiology most likely multifactorial including chromosomal abnormality and history of undescended testes.  - He has required testosterone  supplement for several years now. I have reviewed his EMR records and found out that he had low testosterone  starting from at least from 2012.  -I have not seen FSH/LH levels, would have  been useful prior to initiation of testosterone  therapy however the utility of that test now is unremarkable. 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  He wishes to be continued on testosterone  supplement.  His total testosterone  has improved to 261.  He is advised to continue testosterone  to 100 mg IM every 10 days.  This will give him a total of 300 mg of testosterone  monthly.  Therapeutic goal is to keep his total testosterone  above 250.  -He will need repeat testosterone  measurement along with CBC during his next visit. -Safe and proper utility of androgen replacement therapy was discussed with him.   3. Undescended right testicle - He is status post orchidopexy of right testicle. The details of his surgical history are not available for review.  4) hypertension -His blood pressure is not controlled to target.    He is on a new medication list for blood pressure including Tribenzor-olmesartan -amlodipine -HCTZ 40 - 10 - 25 mg p.o. daily, metoprolol  100 mg p.o. daily.  He is advised to continue.  Salt restrictions and  exercise were discussed with him.  5) hyperlipidemia  -His most recent lipid panel showed persistent elevation of LDL at 127.  He is advised to continue Crestor  10 mg p.o. nightly.    side effects and precautions discussed with him.    He is advised to continue close follow-up with his PMD Dr. Carlette.  I spent  26  minutes in the care of the patient today including review of labs from Thyroid  Function, CMP, and other relevant labs ; imaging/biopsy records (current and previous including abstractions from other facilities); face-to-face time discussing  his lab results and symptoms, medications doses, his options of short and long term treatment based on the latest standards of care / guidelines;   and documenting the encounter.  Allen Harding  participated in the discussions, expressed understanding, and voiced agreement with the above plans.  All questions were answered to his satisfaction. he is encouraged to contact clinic should he have any questions or concerns prior to his return visit.   Follow up plan: Return in about 4 months (around 09/04/2024) for Fasting Labs  in AM B4 8, A1c -[place].  Ranny Earl, MD Phone: [phone]  Fax: [phone]   This note was partially dictated with voice recognition software. Similar sounding words can be transcribed inadequately or may not  be corrected upon review.  05/06/2024, 12:37 PM

## 2024-05-06 NOTE — Patient Instructions (Signed)

## 2024-05-11 ENCOUNTER — Ambulatory Visit (INDEPENDENT_AMBULATORY_CARE_PROVIDER_SITE_OTHER): Payer: MEDICAID | Admitting: Adult Health

## 2024-05-26 ENCOUNTER — Ambulatory Visit (INDEPENDENT_AMBULATORY_CARE_PROVIDER_SITE_OTHER): Payer: MEDICAID | Admitting: Adult Health

## 2024-05-26 ENCOUNTER — Encounter (INDEPENDENT_AMBULATORY_CARE_PROVIDER_SITE_OTHER): Payer: Self-pay | Admitting: Adult Health

## 2024-05-26 DIAGNOSIS — Z6841 Body Mass Index (BMI) 40.0 and over, adult: Secondary | ICD-10-CM

## 2024-05-26 DIAGNOSIS — I1A0 Resistant hypertension: Secondary | ICD-10-CM

## 2024-05-26 DIAGNOSIS — E559 Vitamin D deficiency, unspecified: Secondary | ICD-10-CM | POA: Diagnosis not present

## 2024-05-26 DIAGNOSIS — Z7985 Long-term (current) use of injectable non-insulin antidiabetic drugs: Secondary | ICD-10-CM | POA: Diagnosis not present

## 2024-05-26 DIAGNOSIS — E1169 Type 2 diabetes mellitus with other specified complication: Secondary | ICD-10-CM | POA: Diagnosis not present

## 2024-05-26 MED ORDER — VITAMIN D (ERGOCALCIFEROL) 1.25 MG (50000 UNIT) PO CAPS: 50000.0000 [IU] | ORAL_CAPSULE | ORAL | 0 refills | Status: AC

## 2024-05-26 NOTE — Progress Notes (Signed)
 "    WEIGHT SUMMARY AND BIOMETRICS  Vitals Temp: 98.7 F (37.1 C) BP: (!) 141/92 Pulse Rate: 97 SpO2: 95 %   Anthropometric Measurements Height: 5' 10 (1.778 m) Weight: (!) 400 lb (181.4 kg) BMI (Calculated): 57.39 Weight at Last Visit: 397lb Weight Lost Since Last Visit: 0lb Weight Gained Since Last Visit: 3lb Starting Weight: 411lb Total Weight Loss (lbs): 41 lb (18.6 kg)   Body Composition  Body Fat %: 45.3 % Fat Mass (lbs): 181.4 lbs Muscle Mass (lbs): 208.4 lbs Total Body Water (lbs): 150.4 lbs Visceral Fat Rating : 33   Other Clinical Data Fasting: No Labs: No Starting Date: 11/19/22    Chief Complaint:   OBESITY Allen Harding is here to discuss his progress with his obesity treatment plan.  He is on the the Category 4 Plan and states he is following his eating plan approximately 50 % of the time.  He states he is exercising Walking 30 minutes 2 times per week.  Interim History:  Initial and recheck BP both above goal He reports missing antihypertensive doses yesterday He denies acute cardiac sx's at present He denies tobacco/vape use  05/04/2024 Chronic f/u at Endo Ozempic  2mg  will be replaced with Mounjaro  2.5mg - once insurance approval is secured  Reviewed Bioimpedance Results with pt: Muscle Mass: +4.8 lbs Adipose Mass: -1.6 lbs  Non scale win: the jeans that he has on today are fitting better  Of note- Mother is a BS during OV  Subjective:   1. Vitamin D  deficiency  Latest Reference Range & Units 07/09/23 11:07 07/09/23 11:08 01/01/24 12:22  Vitamin D , 25-Hydroxy 30.0 - 100.0 ng/mL 18.8 (L) 21.1 (L) 26.0 (L)  (L): Data is abnormally low  He is on twice weekly Ergocalciferol   2. Type 2 diabetes mellitus with morbid obesity (HCC) 05/04/2024 Chronic f/u at Endo Ozempic  2mg  will be replaced with Mounjaro  2.5mg - once insurance approval is secured He has not been checking home CBG He denies sx's of hypoglycemia He administers Ozempic  2mg  on  Friday and he will experience low appetite and diarrhea for 24-28 hrs after dose. He is also on Metformin  500mg  daily  3. Resistant hypertension Initial and recheck BP both above goal He reports missing antihypertensive doses yesterday He denies acute cardiac sx's at present He denies tobacco/vape use He is on metoprolol  succinate (TOPROL -XL) 100 MG 24 hr tablet  rosuvastatin  (CRESTOR ) 10 MG tablet  Olmesartan -amLODIPine -HCTZ (TRIBENZOR) 40-10-25 MG TABS  hydrALAZINE  (APRESOLINE ) 25 MG tablet   Assessment/Plan:   1. Vitamin D  deficiency Refill Vitamin D , Ergocalciferol , (DRISDOL ) 1.25 MG (50000 UNIT) CAPS capsule Take 1 capsule (50,000 Units total) by mouth every 3 (three) days. Dispense: 12 capsule, Refills: 0 ordered   2. Type 2 diabetes mellitus with morbid obesity (HCC) (Primary) Limit simple CHO and sugar Stop soda use Complete Ozempic  2mg  then convert to Mounjaro  2.5mg  per Endocrinology  3. Resistant hypertension Limit Na+ intake Reduce stimulant use, ie: soda Take all antihypertensives as directed Continue metoprolol  succinate (TOPROL -XL) 100 MG 24 hr tablet  rosuvastatin  (CRESTOR ) 10 MG tablet  Olmesartan -amLODIPine -HCTZ (TRIBENZOR) 40-10-25 MG TABS  hydrALAZINE  (APRESOLINE ) 25 MG tablet   4. BMI 60.0-69.9, adult (HCC)- current BMI 57.5  Allen Harding is currently in the action stage of change. As such, his goal is to get back to weightloss efforts . He has agreed to the Category 4 Plan.   Exercise goals: All adults should avoid inactivity. Some physical activity is better than none, and adults who participate in any amount of  physical activity gain some health benefits. Adults should also include muscle-strengthening activities that involve all major muscle groups on 2 or more days a week. Increase daily walking.  Behavioral modification strategies: increasing lean protein intake, decreasing simple carbohydrates, increasing vegetables, increasing water intake, decreasing  liquid calories, decreasing sodium intake, increasing high fiber foods, decreasing eating out, no skipping meals, meal planning and cooking strategies, keeping healthy foods in the home, ways to avoid boredom eating, ways to avoid night time snacking, holiday eating strategies , avoiding temptations, planning for success, and decreasing junk food.  Allen Harding has agreed to follow-up with our clinic in 4 weeks. He was informed of the importance of frequent follow-up visits to maximize his success with intensive lifestyle modifications for his multiple health conditions.   Objective:   Blood pressure (!) 141/92, pulse 97, temperature 98.7 F (37.1 C), height 5' 10 (1.778 m), weight (!) 400 lb (181.4 kg), SpO2 95%. Body mass index is 57.39 kg/m.  General: Cooperative, alert, well developed, in no acute distress. HEENT: Conjunctivae and lids unremarkable. Cardiovascular: Regular rhythm.  Lungs: Normal work of breathing. Neurologic: No focal deficits.   Lab Results  Component Value Date   CREATININE 0.69 (L) 12/04/2023   BUN 9 12/04/2023   NA 140 12/04/2023   K 4.4 12/04/2023   CL 102 12/04/2023   CO2 22 12/04/2023   Lab Results  Component Value Date   ALT 22 12/04/2023   AST 19 12/04/2023   ALKPHOS 97 12/04/2023   BILITOT 0.5 12/04/2023   Lab Results  Component Value Date   HGBA1C 6.1 (A) 05/06/2024   HGBA1C 7.0 12/11/2023   HGBA1C 6.7 (H) 07/09/2023   HGBA1C 7.6 (H) 11/19/2022   HGBA1C 7.6 (H) 09/03/2022   Lab Results  Component Value Date   INSULIN  44.7 (H) 01/01/2024   INSULIN  46.9 (H) 07/09/2023   INSULIN  43.8 (H) 11/19/2022   Lab Results  Component Value Date   TSH 3.580 11/19/2022   Lab Results  Component Value Date   CHOL 194 12/04/2023   HDL 45 12/04/2023   LDLCALC 127 (H) 12/04/2023   TRIG 123 12/04/2023   CHOLHDL 4.3 12/04/2023   Lab Results  Component Value Date   VD25OH 26.0 (L) 01/01/2024   VD25OH 21.1 (L) 07/09/2023   VD25OH 18.8 (L) 07/09/2023    Lab Results  Component Value Date   WBC 16.7 (H) 12/04/2023   HGB 14.3 12/04/2023   HCT 44.9 12/04/2023   MCV 86 12/04/2023   PLT 408 12/04/2023   No results found for: IRON, TIBC, FERRITIN  Attestation Statements:   Reviewed by clinician on day of visit: allergies, medications, problem list, medical history, surgical history, family history, social history, and previous encounter notes.  I have reviewed the above documentation for accuracy and completeness, and I agree with the above. -  Renne Cornick d. Montrelle Eddings, NP-C "

## 2024-05-30 ENCOUNTER — Other Ambulatory Visit: Payer: Self-pay | Admitting: "Endocrinology

## 2024-06-10 ENCOUNTER — Other Ambulatory Visit (HOSPITAL_COMMUNITY): Payer: Self-pay

## 2024-06-10 ENCOUNTER — Telehealth: Payer: Self-pay

## 2024-06-10 NOTE — Telephone Encounter (Signed)
 Pharmacy Patient Advocate Encounter   Received notification from Pt Calls Messages that prior authorization for Mounjaro  2.5mg /0.6ml is required/requested.   Insurance verification completed.   The patient is insured through [employer].   Per test claim: PA required; PA submitted to above mentioned insurance via Latent Key/confirmation #/EOC [id number] Status is pending

## 2024-06-10 NOTE — Telephone Encounter (Signed)
 Patient is needing a PA done on Mounjaro 

## 2024-06-11 ENCOUNTER — Other Ambulatory Visit: Payer: Self-pay | Admitting: "Endocrinology

## 2024-06-17 ENCOUNTER — Other Ambulatory Visit: Payer: Self-pay | Admitting: "Endocrinology

## 2024-06-17 ENCOUNTER — Encounter: Payer: Self-pay | Admitting: "Endocrinology

## 2024-06-18 ENCOUNTER — Telehealth: Payer: Self-pay | Admitting: *Deleted

## 2024-06-18 ENCOUNTER — Other Ambulatory Visit (HOSPITAL_COMMUNITY): Payer: Self-pay

## 2024-06-18 NOTE — Telephone Encounter (Signed)
 Patient has sent a message asking what is taking so long to get his medication. I can see that a PA was needed for the Mounjaro .  May I ask if there is any updates?

## 2024-06-18 NOTE — Telephone Encounter (Signed)
 Pharmacy Patient Advocate Encounter  Received notification from [facility] that Prior Authorization for Mounjaro  2.5mg  has been DENIED.  Full denial letter will be uploaded to the media tab. See denial reason below.    PA #/Case ID/Reference #: [id number]

## 2024-06-21 NOTE — Telephone Encounter (Signed)
 Noted.

## 2024-06-21 NOTE — Telephone Encounter (Signed)
 PA denied for Mounjaro  patient needs to try and fail two of the recommended medications approved: Byetta pen, Trulicity  pen, Victoza pen, Ozempic  pen (tried).

## 2024-06-21 NOTE — Telephone Encounter (Signed)
 Sending to Dr, Lenis to make decision.

## 2024-06-22 ENCOUNTER — Ambulatory Visit (INDEPENDENT_AMBULATORY_CARE_PROVIDER_SITE_OTHER): Payer: MEDICAID | Admitting: Cardiovascular Disease

## 2024-06-22 ENCOUNTER — Encounter (HOSPITAL_BASED_OUTPATIENT_CLINIC_OR_DEPARTMENT_OTHER): Payer: Self-pay | Admitting: Cardiovascular Disease

## 2024-06-22 ENCOUNTER — Other Ambulatory Visit: Payer: Self-pay | Admitting: "Endocrinology

## 2024-06-22 VITALS — BP 138/88 | HR 100 | Ht 71.0 in | Wt >= 6400 oz

## 2024-06-22 DIAGNOSIS — E782 Mixed hyperlipidemia: Secondary | ICD-10-CM

## 2024-06-22 DIAGNOSIS — I1A0 Resistant hypertension: Secondary | ICD-10-CM

## 2024-06-22 DIAGNOSIS — Z5181 Encounter for therapeutic drug level monitoring: Secondary | ICD-10-CM | POA: Diagnosis not present

## 2024-06-22 DIAGNOSIS — I10 Essential (primary) hypertension: Secondary | ICD-10-CM | POA: Diagnosis not present

## 2024-06-22 MED ORDER — SPIRONOLACTONE 25 MG PO TABS: 25.0000 mg | ORAL_TABLET | Freq: Every day | ORAL | 3 refills | Status: AC

## 2024-06-22 MED ORDER — TRULICITY 0.75 MG/0.5ML ~~LOC~~ SOAJ: 0.7500 mg | SUBCUTANEOUS | 0 refills | Status: AC

## 2024-06-22 NOTE — Progress Notes (Unsigned)
 "  [facility] Initial Assessment:    Date:  06/23/2024   ID:  Allen Harding, DOB 1996-04-14, MRN [id number]  PCP:  Kayla Jeoffrey GORMAN, FNP  Cardiologist:  None  Nephrologist:  Referring MD: Kayla Jeoffrey GORMAN, FNP   CC: Hypertension  History of Present Illness:    Allen Harding is a 29 y.o. male with a hx of hypertension mild to moderate autism, ADHD, learning disorder, and morbid obesity here for follow up.  He was first seen in the ADV HTN clinic 06/2023.  At the time he was taking chlorthalidoe, enalapril  and metoprolol  and BP was uncontrolled.  [facility] was seen at [facility] where his mother noted that he was taking his medication every other day.  He reported home blood pressures in the 150s.  He also reported palpitations which he managed by trying to calm himself down.  He struggled with missing doses of medications and not liking to take so many pills.  He was transition to Tribenzor and metoprolol  was switched to succinate in order to reduce his pill burden.  He was also referred for sleep study.  He followed up with our pharmacist and hydralazine  was added.  Discussed the use of AI scribe software for clinical note transcription with the patient, who gave verbal consent to proceed.  History of Present Illness Allen Harding has been experiencing some physical discomfort, which he attributes to aging, but otherwise feels fine.  He has been off one of his diabetes medications for the past two weeks due to insurance issues. His endocrinologist intended to prescribe Mounjaro , but it was not covered by insurance. He is awaiting approval for Trulicity  as an alternative.  He has lost 65 pounds but has regained 2-3 pounds recently. He is trying to adhere to his diet and engages in some physical activity, primarily walking. He has been cleared to lift weights up to 50 pounds.  He is currently taking a combination pill for blood pressure, metoprolol , and hydralazine  three  times a day. He prefers taking all his medications in the morning. He has not been regularly checking his blood pressure at home.  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 DX: E11.65   Blood Glucose Monitoring Suppl (ACCU-CHEK GUIDE) w/Device KIT Use as directed to check glucose daily. DX: E11.65   Blood Pressure Monitoring KIT 1 Units by Does not apply route daily.   glucose blood (ACCU-CHEK GUIDE) test strip Use as directed to check glucose daily. DX: E11.65   hydrALAZINE  (APRESOLINE ) 25 MG tablet Take 1 tablet (  25 mg total) by mouth 3 (three) times daily.   metFORMIN  (GLUCOPHAGE ) 500 MG tablet Take 1 tablet (500 mg total) by mouth daily with breakfast.   metoprolol  succinate (TOPROL -XL) 100 MG 24 hr tablet Take 1 tablet (100 mg total) by mouth daily. Take with or immediately following a meal.   Multiple Vitamin (MULTIVITAMIN) tablet Take 1 tablet by mouth daily.   Olmesartan -amLODIPine -HCTZ (TRIBENZOR) 40-10-25 MG TABS Take 1 tablet by mouth every morning.   rosuvastatin  (CRESTOR ) 10 MG tablet Take 0.5 tablets (5 mg total) by mouth daily.   spironolactone  (ALDACTONE ) 25 MG tablet Take 1 tablet (25 mg total) by mouth  daily.   SYRINGE-NEEDLE, DISP, 3 ML 21G X 1-1/2 3 ML MISC Use to inject testosterone  every week   testosterone  cypionate (DEPO-TESTOSTERONE ) 200 MG/ML injection Inject 100 mg ( 1/2 ml every 10 days)   Vitamin D , Ergocalciferol , (DRISDOL ) 1.25 MG (50000 UNIT) CAPS capsule Take 1 capsule (50,000 Units total) by mouth every 3 (three) days.     Allergies:   Patient has no known allergies.   Social History   Socioeconomic History   Marital status: Single    Spouse name: Not on file   Number of children: Not on file   Years of education: Not on file   Highest education level: 12th grade  Occupational History   Not on file  Tobacco Use   Smoking status: Former    Current packs/day: 0.00    Average packs/day: 0.3 packs/day    Types: Cigarettes    Quit date: 01/09/2014    Years since quitting: 10.4    Passive exposure: Past   Smokeless tobacco: Never  Vaping Use   Vaping status: Never Used  Substance and Sexual Activity   Alcohol use: Yes    Comment: socially   Drug use: No   Sexual activity: Not on file  Other Topics Concern   Not on file  Social History Narrative   Lives with mom, sister, 2 nieces, grandparents and sister's fiance.    Social Drivers of Health   Tobacco Use: Medium Risk (06/22/2024)   Patient History    Smoking Tobacco Use: Former    Smokeless Tobacco Use: Never    Passive Exposure: Past  Physicist, Medical Strain: Low Risk (01/08/2023)   Overall Financial Resource Strain (CARDIA)    Difficulty of Paying Living Expenses: Not hard at all  Food Insecurity: No Food Insecurity (06/25/2023)   Hunger Vital Sign    Worried About Running Out of Food in the Last Year: Never true    Ran Out of Food in the Last Year: Never true  Transportation Needs: No Transportation Needs (06/25/2023)   PRAPARE - Administrator, Civil Service (Medical): No    Lack of Transportation (Non-Medical): No  Physical Activity: Insufficiently Active (06/25/2023)   Exercise Vital  Sign    Days of Exercise per Week: 3 days    Minutes of Exercise per Session: 30 min  Stress: No Stress Concern Present (03/24/2023)   [employer] of Occupational Health - Occupational Stress Questionnaire    Feeling of Stress : Only a little  Social Connections: Moderately Integrated (03/24/2023)   Social Connection and Isolation Panel    Frequency of Communication with Friends and Family: More than three times a week    Frequency of Social Gatherings with Friends and Family: More than three times a week    Attends Religious Services: More than 4 times per year    Active  Member of Clubs or Organizations: Yes    Attends Banker Meetings: More than 4 times per year    Marital Status: Never married  Depression (PHQ2-9): Low Risk (02/13/2024)   Depression (PHQ2-9)    PHQ-2 Score: 0  Alcohol Screen: Low Risk (01/08/2023)   Alcohol Screen    Last Alcohol Screening Score (AUDIT): 0  Housing: Low Risk (06/25/2023)   Housing Stability Vital Sign    Unable to Pay for Housing in the Last Year: No    Number of Times Moved in the Last Year: 1    Homeless in the Last Year: No  Utilities: Not on file  Health Literacy: Not on file     Family History: The patient's family history includes ADD / ADHD in his sister; Anxiety disorder in his sister; Arthritis in his maternal grandmother; COPD in his maternal grandfather; Cancer in his maternal grandfather, maternal grandmother, and mother; Depression in his mother and sister; Diabetes in his maternal grandmother and mother; Heart attack in his maternal grandfather and maternal uncle; Hyperlipidemia in his mother; Hypertension in his maternal aunt, maternal uncle, mother, and sister; Kidney disease in his maternal grandmother; Learning disabilities in his mother and sister; Obesity in his maternal grandfather, maternal grandmother, mother, and sister; Stroke in his maternal grandmother.  ROS:   Please see the history of present illness.      All other systems reviewed and are negative.  EKGs/Labs/Other Studies Reviewed:    EKG:  EKG is ordered today.    EKG Interpretation Date/Time:    Ventricular Rate:    PR Interval:    QRS Duration:    QT Interval:    QTC Calculation:   R Axis:      Text Interpretation:         Recent Labs: 07/09/2023: Magnesium 1.8 12/04/2023: ALT 22; BUN 9; Creatinine, Ser 0.69; Hemoglobin 14.3; Platelets 408; Potassium 4.4; Sodium 140   Recent Lipid Panel    Component Value Date/Time   CHOL 194 12/04/2023 0842   TRIG 123 12/04/2023 0842   HDL 45 12/04/2023 0842   CHOLHDL 4.3 12/04/2023 0842   CHOLHDL 3.9 09/03/2022 0850   VLDL 21 08/08/2015 1152   LDLCALC 127 (H) 12/04/2023 0842   LDLCALC 130 (H) 09/03/2022 0850    Physical Exam:   VS:  BP 138/88 (BP Location: Left Arm, Patient Position: Sitting, Cuff Size: Normal) Comment (BP Location): left wrist/forearm - XL cuff would not fit upper arm  Pulse 100   Ht 5' 11 (1.803 m)   Wt (!) 402 lb 9.6 oz (182.6 kg)   SpO2 95%   BMI 56.15 kg/m  , BMI Body mass index is 56.15 kg/m. GENERAL:  Well appearing HEENT: Pupils equal round and reactive, fundi not visualized, oral mucosa unremarkable NECK:  No jugular venous distention, waveform within normal limits, carotid upstroke brisk and symmetric, no bruits, no thyromegaly LUNGS:  Clear to auscultation bilaterally HEART:  RRR.  PMI not displaced or sustained,S1 and S2 within normal limits, no S3, no S4, no clicks, no rubs, no murmurs ABD:  Flat, positive bowel sounds normal in frequency in pitch, no bruits, no rebound, no guarding, no midline pulsatile mass, no hepatomegaly, no splenomegaly EXT:  2 plus pulses throughout, no edema, no cyanosis no clubbing SKIN:  No rashes no nodules NEURO:  Cranial nerves II through XII grossly intact, motor grossly intact throughout PSYCH:  Cognitively intact, oriented to person place and time   ASSESSMENT/PLAN:  Assessment & Plan # Resistant  hypertension Hypertension managed with combination therapy.  Much better but still not at goal.  He struggles with multiple times a day medication.  I do not think hydralazine  is the right option for him.  Current regimen challenging due to dosing frequency. - Initiated spironolactone  25 mg once daily to simplify regimen and improve adherence. -Stop hydralazine .  Continue olmesartan /amlodipine /hydrochlorothiazide and metoprolol . - Ordered labs in one week to monitor potassium and assess efficacy. - Scheduled follow-up in two to three months.  # Morbid obesity Significant weight loss achieved. Recent weight gain noted. Insurance issues delaying Mounjaro  initiation, considering Trulicity .  Continue work with PCP.  Continue to encourage increased exercise. - Await insurance approval for Trulicity .  # Mixed hyperlipidemia Cholesterol evaluation due. - Ordered fasting lipid panel and CMP.  Continue rosuvastatin .  # Tachycardia:  Stable on metoprolol .   Screening for Secondary Hypertension:     06/25/2023    2:42 PM  Causes  Drugs/Herbals Screened     - Comments limits salt.  Rare NSAIDs.  Rare EtOH.  No supplements.  No tobacco  Renovascular HTN N/A  Sleep Apnea Screened     - Comments snores, daytime somnolkence  Thyroid  Disease Screened  Hyperaldosteronism N/A  Pheochromocytoma N/A  Cushing's Syndrome Screened  Hyperparathyroidism Screened  Coarctation of the Aorta Screened  Compliance Screened    Relevant Labs/Studies:    Latest Ref Rng & Units 12/04/2023    8:42 AM 07/09/2023   11:08 AM 11/19/2022   10:19 AM  Basic Labs  Sodium 134 - 144 mmol/L 140  140    139  139   Potassium 3.5 - 5.2 mmol/L 4.4  3.7    3.7  3.6   Creatinine 0.76 - 1.27 mg/dL 9.30  9.25    9.24  9.25        Latest Ref Rng & Units 11/19/2022   10:19 AM 09/03/2022    8:50 AM  Thyroid    TSH 0.450 - 4.500 uIU/mL 3.580  3.21       Disposition:    FU with MD/PharmD in 2-3 months    Medication  Adjustments/Labs and Tests Ordered: Current medicines are reviewed at length with the patient today.  Concerns regarding medicines are outlined above.  Orders Placed This Encounter  Procedures   Lipid panel   Comprehensive metabolic panel with GFR   Meds ordered this encounter  Medications   spironolactone  (ALDACTONE ) 25 MG tablet    Sig: Take 1 tablet (25 mg total) by mouth daily.    Dispense:  90 tablet    Refill:  3    D/C HYDRALAZINE      Signed, Annabella Scarce, MD  06/23/2024 8:25 AM    [facility]  "

## 2024-06-22 NOTE — Telephone Encounter (Signed)
 Left a message on Kier Smead' voice mail, sharing that a prescription was sent in for the Trulicity .

## 2024-06-22 NOTE — Patient Instructions (Signed)
 Medication Instructions:  STOP HYDRALAZINE   START SPIRONOLACTONE  25 MG DAILY   Labwork: FASTING LIPID/CMET IN 1 WEEK   Testing/Procedures: NONE  Follow-Up: 2 TO 3 MONTHS   If you need a refill on your cardiac medications before your next appointment, please call your pharmacy.

## 2024-06-23 ENCOUNTER — Encounter (HOSPITAL_BASED_OUTPATIENT_CLINIC_OR_DEPARTMENT_OTHER): Payer: Self-pay | Admitting: Cardiovascular Disease

## 2024-06-24 ENCOUNTER — Other Ambulatory Visit (HOSPITAL_COMMUNITY): Payer: Self-pay

## 2024-06-24 ENCOUNTER — Ambulatory Visit (INDEPENDENT_AMBULATORY_CARE_PROVIDER_SITE_OTHER): Payer: MEDICAID | Admitting: Adult Health

## 2024-06-24 ENCOUNTER — Telehealth: Payer: Self-pay | Admitting: Pharmacy Technician

## 2024-06-24 NOTE — Telephone Encounter (Signed)
 Pharmacy Patient Advocate Encounter   Received notification from [employer] that prior authorization for Trulicity  0.75MG /0.5ML auto-injectors  is required/requested.   Insurance verification completed.   The patient is insured through [employer].   Per test claim: PA required; PA submitted to above mentioned insurance via Latent Key/confirmation #/EOC [id number] Status is pending

## 2024-06-25 ENCOUNTER — Other Ambulatory Visit (HOSPITAL_COMMUNITY): Payer: Self-pay

## 2024-06-25 NOTE — Telephone Encounter (Signed)
 Pharmacy Patient Advocate Encounter  Received notification from [facility] that Prior Authorization for Trulicity  0.75MG /0.5ML auto-injectors  has been APPROVED from 06/23/24 to 06/29/25. Unable to obtain price due to refill too soon rejection, last fill date 06/25/24 next available fill date 07/16/24   PA #/Case ID/Reference #: [id number]

## 2024-07-13 ENCOUNTER — Ambulatory Visit (INDEPENDENT_AMBULATORY_CARE_PROVIDER_SITE_OTHER): Payer: MEDICAID | Admitting: Adult Health

## 2024-09-07 ENCOUNTER — Ambulatory Visit: Payer: MEDICAID | Admitting: "Endocrinology

## 2024-09-15 ENCOUNTER — Encounter (HOSPITAL_BASED_OUTPATIENT_CLINIC_OR_DEPARTMENT_OTHER): Payer: MEDICAID | Admitting: Cardiovascular Disease
